# Patient Record
Sex: Male | Born: 1935 | Race: White | Hispanic: No | Smoking: Former smoker
Health system: Southern US, Community
[De-identification: ages and names within clinical notes are randomized; demographics above are authoritative.]

## PROBLEM LIST (undated history)

## (undated) DIAGNOSIS — E039 Hypothyroidism, unspecified: Secondary | ICD-10-CM

## (undated) DIAGNOSIS — Z950 Presence of cardiac pacemaker: Secondary | ICD-10-CM

## (undated) DIAGNOSIS — Z95810 Presence of automatic (implantable) cardiac defibrillator: Secondary | ICD-10-CM

## (undated) DIAGNOSIS — F0390 Unspecified dementia without behavioral disturbance: Secondary | ICD-10-CM

## (undated) DIAGNOSIS — C44300 Unspecified malignant neoplasm of skin of unspecified part of face: Secondary | ICD-10-CM

## (undated) DIAGNOSIS — I442 Atrioventricular block, complete: Secondary | ICD-10-CM

## (undated) HISTORY — PX: SKIN CANCER EXCISION: SHX779

## (undated) HISTORY — PX: CATARACT EXTRACTION: SUR2

## (undated) HISTORY — PX: CATARACT EXTRACTION W/ INTRAOCULAR LENS IMPLANT: SHX1309

## (undated) HISTORY — DX: Atrioventricular block, complete: I44.2

## (undated) HISTORY — PX: APPENDECTOMY: SHX54

---

## 2011-08-12 DIAGNOSIS — R55 Syncope and collapse: Secondary | ICD-10-CM | POA: Insufficient documentation

## 2016-03-01 ENCOUNTER — Inpatient Hospital Stay (HOSPITAL_COMMUNITY): Payer: Medicare Other | Admitting: Certified Registered Nurse Anesthetist

## 2016-03-01 ENCOUNTER — Encounter (INDEPENDENT_AMBULATORY_CARE_PROVIDER_SITE_OTHER): Payer: Medicare Other | Admitting: Ophthalmology

## 2016-03-01 ENCOUNTER — Encounter (HOSPITAL_COMMUNITY): Payer: Self-pay | Admitting: *Deleted

## 2016-03-01 ENCOUNTER — Encounter (HOSPITAL_COMMUNITY)
Admission: RE | Disposition: A | Discharge status: Home / Self Care | Payer: Self-pay | Source: Ambulatory Visit | Attending: Ophthalmology

## 2016-03-01 ENCOUNTER — Observation Stay (HOSPITAL_COMMUNITY)
Admission: RE | Admit: 2016-03-01 | Discharge: 2016-03-02 | Disposition: A | Discharge status: Home / Self Care | Payer: Medicare Other | Source: Ambulatory Visit | Attending: Ophthalmology | Admitting: Ophthalmology

## 2016-03-01 DIAGNOSIS — H44022 Vitreous abscess (chronic), left eye: Secondary | ICD-10-CM | POA: Diagnosis not present

## 2016-03-01 DIAGNOSIS — H353112 Nonexudative age-related macular degeneration, right eye, intermediate dry stage: Secondary | ICD-10-CM | POA: Diagnosis not present

## 2016-03-01 DIAGNOSIS — Z79899 Other long term (current) drug therapy: Secondary | ICD-10-CM | POA: Diagnosis not present

## 2016-03-01 DIAGNOSIS — H44002 Unspecified purulent endophthalmitis, left eye: Principal | ICD-10-CM | POA: Diagnosis present

## 2016-03-01 DIAGNOSIS — Z87891 Personal history of nicotine dependence: Secondary | ICD-10-CM | POA: Insufficient documentation

## 2016-03-01 DIAGNOSIS — H44009 Unspecified purulent endophthalmitis, unspecified eye: Secondary | ICD-10-CM | POA: Diagnosis present

## 2016-03-01 DIAGNOSIS — H43813 Vitreous degeneration, bilateral: Secondary | ICD-10-CM | POA: Diagnosis not present

## 2016-03-01 HISTORY — PX: PARS PLANA VITRECTOMY: SHX2166

## 2016-03-01 HISTORY — DX: Unspecified dementia, unspecified severity, without behavioral disturbance, psychotic disturbance, mood disturbance, and anxiety: F03.90

## 2016-03-01 LAB — GLUCOSE, CAPILLARY
Glucose-Capillary: 173 mg/dL — ABNORMAL HIGH (ref 65–99)
Glucose-Capillary: 188 mg/dL — ABNORMAL HIGH (ref 65–99)

## 2016-03-01 SURGERY — PARS PLANA VITRECTOMY 25 GAUGE FOR ENDOPHTHALMITIS
Anesthesia: General | Site: Eye

## 2016-03-01 MED ORDER — FENTANYL CITRATE (PF) 100 MCG/2ML IJ SOLN
INTRAMUSCULAR | Status: DC | PRN
  Administered 2016-03-01 (×2): 50 ug via INTRAVENOUS

## 2016-03-01 MED ORDER — ONDANSETRON HCL 4 MG/2ML IJ SOLN
INTRAMUSCULAR | Status: AC
  Filled 2016-03-01: qty 4

## 2016-03-01 MED ORDER — PROPOFOL 10 MG/ML IV BOLUS
INTRAVENOUS | Status: AC
  Filled 2016-03-01: qty 20

## 2016-03-01 MED ORDER — TROPICAMIDE 1 % OP SOLN
1.0000 [drp] | OPHTHALMIC | Status: DC | PRN
  Filled 2016-03-01: qty 3

## 2016-03-01 MED ORDER — STERILE WATER FOR INJECTION IJ SOLN
INTRAMUSCULAR | Status: AC
Start: 2016-03-01 — End: 2016-03-01
  Filled 2016-03-01: qty 20

## 2016-03-01 MED ORDER — BACITRACIN-POLYMYXIN B 500-10000 UNIT/GM OP OINT
TOPICAL_OINTMENT | OPHTHALMIC | Status: DC | PRN
  Administered 2016-03-01: 1 via OPHTHALMIC

## 2016-03-01 MED ORDER — SUFENTANIL CITRATE 50 MCG/ML IV SOLN
INTRAVENOUS | Status: AC
  Filled 2016-03-01: qty 1

## 2016-03-01 MED ORDER — OFLOXACIN 0.3 % OP SOLN
1.0000 [drp] | Freq: Four times a day (QID) | OPHTHALMIC | Status: DC
  Filled 2016-03-01: qty 5

## 2016-03-01 MED ORDER — DEXAMETHASONE SODIUM PHOSPHATE 10 MG/ML IJ SOLN
INTRAMUSCULAR | Status: AC
  Filled 2016-03-01: qty 1

## 2016-03-01 MED ORDER — CYCLOPENTOLATE HCL 1 % OP SOLN
1.0000 [drp] | OPHTHALMIC | Status: DC | PRN
  Filled 2016-03-01: qty 2

## 2016-03-01 MED ORDER — MORPHINE SULFATE (PF) 2 MG/ML IV SOLN: 1.0000 mg | INTRAVENOUS | Status: DC | PRN

## 2016-03-01 MED ORDER — BACITRACIN-POLYMYXIN B 500-10000 UNIT/GM OP OINT
TOPICAL_OINTMENT | OPHTHALMIC | Status: AC
  Filled 2016-03-01: qty 3.5

## 2016-03-01 MED ORDER — TETRACAINE HCL 0.5 % OP SOLN
2.0000 [drp] | Freq: Once | OPHTHALMIC | Status: DC
  Filled 2016-03-01: qty 2

## 2016-03-01 MED ORDER — MAGNESIUM HYDROXIDE 400 MG/5ML PO SUSP: 15.0000 mL | Freq: Four times a day (QID) | ORAL | Status: DC | PRN

## 2016-03-01 MED ORDER — GATIFLOXACIN 0.5 % OP SOLN
1.0000 [drp] | OPHTHALMIC | Status: DC | PRN
  Filled 2016-03-01: qty 2.5

## 2016-03-01 MED ORDER — KETOROLAC TROMETHAMINE 0.5 % OP SOLN
1.0000 [drp] | Freq: Four times a day (QID) | OPHTHALMIC | Status: DC
  Filled 2016-03-01: qty 3

## 2016-03-01 MED ORDER — LIDOCAINE HCL 2 % IJ SOLN
INTRAMUSCULAR | Status: AC
  Filled 2016-03-01: qty 20

## 2016-03-01 MED ORDER — STERILE WATER FOR INJECTION IJ SOLN
INTRAMUSCULAR | Status: DC | PRN
  Administered 2016-03-01: .5 mL

## 2016-03-01 MED ORDER — SUFENTANIL CITRATE 50 MCG/ML IV SOLN
INTRAVENOUS | Status: DC | PRN
  Administered 2016-03-01: 10 ug via INTRAVENOUS

## 2016-03-01 MED ORDER — OXYCODONE HCL 5 MG PO TABS
5.0000 mg | ORAL_TABLET | Freq: Once | ORAL | Status: DC | PRN
Start: 2016-03-01 — End: 2016-03-01

## 2016-03-01 MED ORDER — CEFTAZIDIME INTRAVITREAL INJECTION 2.25 MG/0.1 ML
22.5000 mg | INTRAVITREAL | Status: AC
  Administered 2016-03-01: .1 mg via INTRAVITREAL
  Filled 2016-03-01: qty 1

## 2016-03-01 MED ORDER — BSS PLUS IO SOLN
INTRAOCULAR | Status: AC
Start: 2016-03-01 — End: 2016-03-01
  Filled 2016-03-01: qty 500

## 2016-03-01 MED ORDER — DORZOLAMIDE HCL 2 % OP SOLN
1.0000 [drp] | Freq: Three times a day (TID) | OPHTHALMIC | Status: DC
  Filled 2016-03-01: qty 10

## 2016-03-01 MED ORDER — VANCOMYCIN INTRAVITREAL INJECTION 1 MG/0.1 ML
1.0000 mg | INTRAOCULAR | Status: AC
  Administered 2016-03-01: .1 mg via INTRAVITREAL
  Filled 2016-03-01: qty 0.1

## 2016-03-01 MED ORDER — PROPOFOL 10 MG/ML IV BOLUS
INTRAVENOUS | Status: DC | PRN
  Administered 2016-03-01: 140 mg via INTRAVENOUS

## 2016-03-01 MED ORDER — ACETAZOLAMIDE SODIUM 500 MG IJ SOLR
INTRAMUSCULAR | Status: AC
  Filled 2016-03-01: qty 500

## 2016-03-01 MED ORDER — BUPIVACAINE HCL (PF) 0.75 % IJ SOLN
INTRAMUSCULAR | Status: DC | PRN
  Administered 2016-03-01: 1 mL

## 2016-03-01 MED ORDER — BSS IO SOLN
INTRAOCULAR | Status: AC
  Filled 2016-03-01: qty 15

## 2016-03-01 MED ORDER — PREDNISOLONE ACETATE 1 % OP SUSP
1.0000 [drp] | Freq: Four times a day (QID) | OPHTHALMIC | Status: DC
  Filled 2016-03-01: qty 5

## 2016-03-01 MED ORDER — DORZOLAMIDE HCL-TIMOLOL MAL 2-0.5 % OP SOLN
1.0000 [drp] | Freq: Three times a day (TID) | OPHTHALMIC | Status: DC
  Filled 2016-03-01: qty 10

## 2016-03-01 MED ORDER — TRIAMCINOLONE ACETONIDE 40 MG/ML IJ SUSP
INTRAMUSCULAR | Status: AC
  Filled 2016-03-01: qty 5

## 2016-03-01 MED ORDER — OXYCODONE HCL 5 MG/5ML PO SOLN: 5.0000 mg | Freq: Once | ORAL | Status: DC | PRN

## 2016-03-01 MED ORDER — FENTANYL CITRATE (PF) 100 MCG/2ML IJ SOLN
INTRAMUSCULAR | Status: AC
  Filled 2016-03-01: qty 2

## 2016-03-01 MED ORDER — SODIUM HYALURONATE 10 MG/ML IO SOLN
INTRAOCULAR | Status: AC
  Filled 2016-03-01: qty 0.85

## 2016-03-01 MED ORDER — HYPROMELLOSE (GONIOSCOPIC) 2.5 % OP SOLN
OPHTHALMIC | Status: AC
  Filled 2016-03-01: qty 15

## 2016-03-01 MED ORDER — ACETAZOLAMIDE SODIUM 500 MG IJ SOLR
500.0000 mg | Freq: Once | INTRAMUSCULAR | Status: AC
  Administered 2016-03-02: 500 mg via INTRAVENOUS
  Filled 2016-03-01: qty 500

## 2016-03-01 MED ORDER — VANCOMYCIN HCL IN DEXTROSE 1-5 GM/200ML-% IV SOLN
1000.0000 mg | INTRAVENOUS | Status: AC
  Administered 2016-03-01: 1000 mg via INTRAVENOUS
  Filled 2016-03-01: qty 200

## 2016-03-01 MED ORDER — TEMAZEPAM 15 MG PO CAPS
15.0000 mg | ORAL_CAPSULE | Freq: Every evening | ORAL | Status: DC | PRN
Start: 2016-03-01 — End: 2016-03-02
  Filled 2016-03-01: qty 1

## 2016-03-01 MED ORDER — EPINEPHRINE HCL 1 MG/ML IJ SOLN
INTRAMUSCULAR | Status: AC
  Filled 2016-03-01: qty 1

## 2016-03-01 MED ORDER — ACETAMINOPHEN 325 MG PO TABS: 325.0000 mg | ORAL_TABLET | ORAL | Status: DC | PRN

## 2016-03-01 MED ORDER — GLYCOPYRROLATE 0.2 MG/ML IJ SOLN
INTRAMUSCULAR | Status: DC | PRN
  Administered 2016-03-01: 0.4 mg via INTRAVENOUS
  Administered 2016-03-01: 0.2 mg via INTRAVENOUS

## 2016-03-01 MED ORDER — LEVOTHYROXINE SODIUM 75 MCG PO TABS
75.0000 ug | ORAL_TABLET | ORAL | Status: DC
  Administered 2016-03-02: 75 ug via ORAL
  Filled 2016-03-01: qty 1

## 2016-03-01 MED ORDER — LATANOPROST 0.005 % OP SOLN
1.0000 [drp] | Freq: Every day | OPHTHALMIC | Status: DC
  Filled 2016-03-01: qty 2.5

## 2016-03-01 MED ORDER — DEXAMETHASONE SODIUM PHOS INTRAVITREAL INJECTION 0.4 MG/0.1 ML
4.0000 mg | INTRAVITREAL | Status: AC
  Administered 2016-03-01: .1 mg via INTRAVITREAL
  Filled 2016-03-01: qty 1

## 2016-03-01 MED ORDER — CEFTAZIDIME 1 G IJ SOLR
INTRAMUSCULAR | Status: AC
  Filled 2016-03-01: qty 1

## 2016-03-01 MED ORDER — ATROPINE SULFATE 1 % OP SOLN
OPHTHALMIC | Status: AC
  Filled 2016-03-01: qty 5

## 2016-03-01 MED ORDER — LIDOCAINE HCL (CARDIAC) 20 MG/ML IV SOLN
INTRAVENOUS | Status: DC | PRN
  Administered 2016-03-01: 50 mg via INTRAVENOUS

## 2016-03-01 MED ORDER — SODIUM CHLORIDE 0.9 % IJ SOLN
INTRAMUSCULAR | Status: AC
  Filled 2016-03-01: qty 10

## 2016-03-01 MED ORDER — SODIUM CHLORIDE 0.45 % IV SOLN
INTRAVENOUS | Status: DC
  Administered 2016-03-01: 19:00:00 via INTRAVENOUS

## 2016-03-01 MED ORDER — PHENYLEPHRINE HCL 2.5 % OP SOLN
1.0000 [drp] | OPHTHALMIC | Status: DC | PRN
  Filled 2016-03-01: qty 2

## 2016-03-01 MED ORDER — TIMOLOL MALEATE 0.5 % OP SOLN
1.0000 [drp] | Freq: Three times a day (TID) | OPHTHALMIC | Status: DC
  Filled 2016-03-01: qty 5

## 2016-03-01 MED ORDER — ATROPINE SULFATE 1 % OP SOLN: OPHTHALMIC | Status: DC | PRN

## 2016-03-01 MED ORDER — PREDNISOLONE ACETATE 1 % OP SUSP
1.0000 [drp] | OPHTHALMIC | Status: DC
  Filled 2016-03-01: qty 1

## 2016-03-01 MED ORDER — BSS PLUS IO SOLN
INTRAOCULAR | Status: DC | PRN
  Administered 2016-03-01: 500 mL

## 2016-03-01 MED ORDER — GATIFLOXACIN 0.5 % OP SOLN
1.0000 [drp] | Freq: Four times a day (QID) | OPHTHALMIC | Status: DC
  Filled 2016-03-01: qty 2.5

## 2016-03-01 MED ORDER — HYALURONIDASE HUMAN 150 UNIT/ML IJ SOLN
INTRAMUSCULAR | Status: AC
  Filled 2016-03-01: qty 1

## 2016-03-01 MED ORDER — LACTATED RINGERS IV SOLN
INTRAVENOUS | Status: DC | PRN
  Administered 2016-03-01: 15:00:00 via INTRAVENOUS

## 2016-03-01 MED ORDER — LIDOCAINE 2% (20 MG/ML) 5 ML SYRINGE
INTRAMUSCULAR | Status: AC
  Filled 2016-03-01: qty 5

## 2016-03-01 MED ORDER — ONDANSETRON HCL 4 MG/2ML IJ SOLN
4.0000 mg | Freq: Four times a day (QID) | INTRAMUSCULAR | Status: DC
  Administered 2016-03-01 – 2016-03-02 (×3): 4 mg via INTRAVENOUS
  Filled 2016-03-01 (×3): qty 2

## 2016-03-01 MED ORDER — SODIUM CHLORIDE 0.9 % IV SOLN
INTRAVENOUS | Status: DC
  Administered 2016-03-01: 10 mL/h via INTRAVENOUS

## 2016-03-01 MED ORDER — CEFTAZIDIME 2 G IJ SOLR
2.0000 g | INTRAMUSCULAR | Status: AC
  Administered 2016-03-01: 2 g via INTRAVENOUS
  Filled 2016-03-01: qty 2

## 2016-03-01 MED ORDER — ROCURONIUM BROMIDE 100 MG/10ML IV SOLN
INTRAVENOUS | Status: DC | PRN
  Administered 2016-03-01: 10 mg via INTRAVENOUS
  Administered 2016-03-01: 40 mg via INTRAVENOUS

## 2016-03-01 MED ORDER — ACETAMINOPHEN 160 MG/5ML PO SOLN
325.0000 mg | ORAL | Status: DC | PRN
  Filled 2016-03-01: qty 20.3

## 2016-03-01 MED ORDER — NEOSTIGMINE METHYLSULFATE 10 MG/10ML IV SOLN
INTRAVENOUS | Status: DC | PRN
  Administered 2016-03-01: 3 mg via INTRAVENOUS

## 2016-03-01 MED ORDER — FENTANYL CITRATE (PF) 100 MCG/2ML IJ SOLN: 25.0000 ug | INTRAMUSCULAR | Status: DC | PRN

## 2016-03-01 MED ORDER — EPHEDRINE SULFATE 50 MG/ML IJ SOLN
INTRAMUSCULAR | Status: DC | PRN
  Administered 2016-03-01: 10 mg via INTRAVENOUS

## 2016-03-01 MED ORDER — BRIMONIDINE TARTRATE 0.15 % OP SOLN
1.0000 [drp] | Freq: Two times a day (BID) | OPHTHALMIC | Status: DC
  Filled 2016-03-01: qty 5

## 2016-03-01 MED ORDER — BACITRACIN-POLYMYXIN B 500-10000 UNIT/GM OP OINT
1.0000 "application " | TOPICAL_OINTMENT | Freq: Three times a day (TID) | OPHTHALMIC | Status: DC
  Filled 2016-03-01: qty 3.5

## 2016-03-01 MED ORDER — EPHEDRINE 5 MG/ML INJ
INTRAVENOUS | Status: AC
  Filled 2016-03-01: qty 10

## 2016-03-01 MED ORDER — DEXAMETHASONE SODIUM PHOSPHATE 10 MG/ML IJ SOLN
INTRAMUSCULAR | Status: DC | PRN
  Administered 2016-03-01: 10 mg

## 2016-03-01 MED ORDER — BUPIVACAINE HCL (PF) 0.75 % IJ SOLN
INTRAMUSCULAR | Status: AC
  Filled 2016-03-01: qty 10

## 2016-03-01 MED ORDER — POLYMYXIN B SULFATE 500000 UNITS IJ SOLR
INTRAMUSCULAR | Status: AC
  Filled 2016-03-01: qty 500000

## 2016-03-01 MED ORDER — BRIMONIDINE TARTRATE 0.2 % OP SOLN
1.0000 [drp] | Freq: Two times a day (BID) | OPHTHALMIC | Status: DC
  Filled 2016-03-01: qty 5

## 2016-03-01 MED ORDER — HYDROCODONE-ACETAMINOPHEN 5-325 MG PO TABS: 1.0000 | ORAL_TABLET | ORAL | Status: DC | PRN

## 2016-03-01 MED ORDER — BUPIVACAINE-EPINEPHRINE (PF) 0.25% -1:200000 IJ SOLN
INTRAMUSCULAR | Status: AC
  Filled 2016-03-01: qty 30

## 2016-03-01 MED ORDER — SODIUM HYALURONATE 10 MG/ML IO SOLN
INTRAOCULAR | Status: DC | PRN
  Administered 2016-03-01: 0.85 mL via INTRAOCULAR

## 2016-03-01 SURGICAL SUPPLY — 59 items
APPLICATOR COTTON TIP 6IN STRL (MISCELLANEOUS) ×12 IMPLANT
APPLICATOR DR MATTHEWS STRL (MISCELLANEOUS) IMPLANT
BANDAGE EYE OVAL (MISCELLANEOUS) ×3 IMPLANT
BLADE EYE CATARACT 19 1.4 BEAV (BLADE) IMPLANT
CANNULA VLV SOFT TIP 25GA (OPHTHALMIC) ×3 IMPLANT
CORDS BIPOLAR (ELECTRODE) IMPLANT
COTTONBALL LRG STERILE PKG (GAUZE/BANDAGES/DRESSINGS) IMPLANT
COVER MAYO STAND STRL (DRAPES) ×3 IMPLANT
DRAPE OPHTHALMIC 77X100 STRL (CUSTOM PROCEDURE TRAY) ×3 IMPLANT
FILTER BLUE MILLIPORE (MISCELLANEOUS) IMPLANT
FORCEPS ECKARDT ILM 25G SERR (OPHTHALMIC RELATED) IMPLANT
FORCEPS GRIESHABER ILM 25G A (INSTRUMENTS) IMPLANT
FORCEPS HORIZONTAL 25G DISP (OPHTHALMIC RELATED) IMPLANT
FORCEPS ILM 25G DSP TIP (MISCELLANEOUS) IMPLANT
GAS OPHTHALMIC (MISCELLANEOUS) IMPLANT
GLOVE SS BIOGEL STRL SZ 6.5 (GLOVE) ×1 IMPLANT
GLOVE SS BIOGEL STRL SZ 7 (GLOVE) ×1 IMPLANT
GLOVE SUPERSENSE BIOGEL SZ 6.5 (GLOVE) ×2
GLOVE SUPERSENSE BIOGEL SZ 7 (GLOVE) ×2
GLOVE SURG 8.5 LATEX PF (GLOVE) ×18 IMPLANT
GOWN STRL REUS W/ TWL LRG LVL3 (GOWN DISPOSABLE) ×2 IMPLANT
GOWN STRL REUS W/TWL LRG LVL3 (GOWN DISPOSABLE) ×4
HANDLE PNEUMATIC FOR CONSTEL (OPHTHALMIC) IMPLANT
KIT BASIN OR (CUSTOM PROCEDURE TRAY) ×3 IMPLANT
KIT ROOM TURNOVER OR (KITS) ×3 IMPLANT
MICROPICK 25G (MISCELLANEOUS) ×3
NEEDLE 18GX1X1/2 (RX/OR ONLY) (NEEDLE) ×24 IMPLANT
NEEDLE 25GX 5/8IN NON SAFETY (NEEDLE) ×3 IMPLANT
NEEDLE HYPO 30X.5 LL (NEEDLE) ×24 IMPLANT
NS IRRIG 1000ML POUR BTL (IV SOLUTION) ×3 IMPLANT
PACK VITRECTOMY CUSTOM (CUSTOM PROCEDURE TRAY) ×3 IMPLANT
PAD ARMBOARD 7.5X6 YLW CONV (MISCELLANEOUS) ×3 IMPLANT
PAK PIK VITRECTOMY CVS 25GA (OPHTHALMIC) ×3 IMPLANT
PENCIL BIPOLAR 25GA STR DISP (OPHTHALMIC RELATED) IMPLANT
PIC ILLUMINATED 25G (OPHTHALMIC) ×3
PICK MICROPICK 25G (MISCELLANEOUS) ×1 IMPLANT
PIK ILLUMINATED 25G (OPHTHALMIC) ×1 IMPLANT
PROBE LASER ILLUM FLEX CVD 25G (OPHTHALMIC) IMPLANT
ROLLS DENTAL (MISCELLANEOUS) ×6 IMPLANT
SCRAPER DIAMOND 25GA (OPHTHALMIC RELATED) IMPLANT
SET INJECTOR OIL FLUID CONSTEL (OPHTHALMIC) IMPLANT
SPONGE SURGIFOAM ABS GEL 12-7 (HEMOSTASIS) IMPLANT
STOPCOCK 4 WAY LG BORE MALE ST (IV SETS) ×3 IMPLANT
SUT CHROMIC 7 0 TG140 8 (SUTURE) IMPLANT
SUT ETHILON 10 0 CS140 6 (SUTURE) IMPLANT
SUT ETHILON 9 0 TG140 8 (SUTURE) IMPLANT
SUT SILK 4 0 RB 1 (SUTURE) IMPLANT
SUT VICRYL 7 0 TG140 8 (SUTURE) IMPLANT
SWAB COLLECTION DEVICE MRSA (MISCELLANEOUS) IMPLANT
SYR 20CC LL (SYRINGE) ×3 IMPLANT
SYR 5ML LL (SYRINGE) IMPLANT
SYR BULB 3OZ (MISCELLANEOUS) ×3 IMPLANT
SYR TB 1ML LUER SLIP (SYRINGE) ×21 IMPLANT
SYRINGE 10CC LL (SYRINGE) IMPLANT
TOWEL OR 17X24 6PK STRL BLUE (TOWEL DISPOSABLE) ×9 IMPLANT
TUBE ANAEROBIC SPECIMEN COL (MISCELLANEOUS) IMPLANT
TUBING HIGH PRESS EXTEN 6IN (TUBING) IMPLANT
WATER STERILE IRR 1000ML POUR (IV SOLUTION) ×3 IMPLANT
WIPE INSTRUMENT VISIWIPE 73X73 (MISCELLANEOUS) ×3 IMPLANT

## 2016-03-01 NOTE — Anesthesia Procedure Notes (Signed)
Procedure Name: Intubation Date/Time: 03/01/2016 3:51 PM Performed by: Rejeana Brock L Pre-anesthesia Checklist: Patient identified, Emergency Drugs available, Suction available and Patient being monitored Patient Re-evaluated:Patient Re-evaluated prior to inductionOxygen Delivery Method: Circle System Utilized Preoxygenation: Pre-oxygenation with 100% oxygen Intubation Type: IV induction Ventilation: Mask ventilation without difficulty Laryngoscope Size: Mac and 4 Grade View: Grade I Tube type: Oral Tube size: 7.5 mm Number of attempts: 1 Airway Equipment and Method: Stylet and Oral airway Placement Confirmation: ETT inserted through vocal cords under direct vision,  positive ETCO2 and breath sounds checked- equal and bilateral Secured at: 22 cm Tube secured with: Tape Dental Injury: Teeth and Oropharynx as per pre-operative assessment

## 2016-03-01 NOTE — Anesthesia Preprocedure Evaluation (Addendum)
Anesthesia Evaluation  Patient identified by MRN, date of birth, ID band Patient awake    Reviewed: Allergy & Precautions, NPO status , Patient's Chart, lab work & pertinent test results  History of Anesthesia Complications Negative for: history of anesthetic complications  Airway Mallampati: II  TM Distance: >3 FB Neck ROM: Full    Dental  (+) Teeth Intact   Pulmonary former smoker,    breath sounds clear to auscultation       Cardiovascular negative cardio ROS   Rhythm:Regular     Neuro/Psych negative neurological ROS  negative psych ROS   GI/Hepatic negative GI ROS, Neg liver ROS,   Endo/Other    Renal/GU negative Renal ROS     Musculoskeletal negative musculoskeletal ROS (+)   Abdominal   Peds  Hematology negative hematology ROS (+)   Anesthesia Other Findings   Reproductive/Obstetrics                             Anesthesia Physical Anesthesia Plan  ASA: II  Anesthesia Plan: General   Post-op Pain Management:    Induction: Intravenous  Airway Management Planned: Oral ETT  Additional Equipment: None  Intra-op Plan:   Post-operative Plan: Extubation in OR  Informed Consent: I have reviewed the patients History and Physical, chart, labs and discussed the procedure including the risks, benefits and alternatives for the proposed anesthesia with the patient or authorized representative who has indicated his/her understanding and acceptance.   Dental advisory given  Plan Discussed with: CRNA and Surgeon  Anesthesia Plan Comments:         Anesthesia Quick Evaluation

## 2016-03-01 NOTE — Transfer of Care (Signed)
Immediate Anesthesia Transfer of Care Note  Patient: Tracy Castillo  Procedure(s) Performed: Procedure(s): PARS PLANA VITRECTOMY 25 GAUGE FOR ENDOPHTHALMITIS (N/A)  Patient Location: PACU  Anesthesia Type:General  Level of Consciousness: awake, oriented and patient cooperative  Airway & Oxygen Therapy: Patient Spontanous Breathing and Patient connected to nasal cannula oxygen  Post-op Assessment: Report given to RN, Post -op Vital signs reviewed and stable and Patient moving all extremities  Post vital signs: Reviewed and stable  Last Vitals:  Vitals:   03/01/16 1515 03/01/16 1730  BP: (!) 199/72   Pulse: 69   Resp: 18   Temp: 36.6 C 36.3 C    Last Pain: There were no vitals filed for this visit.    Patients Stated Pain Goal: 5 ([id number])  Complications: No apparent anesthesia complications

## 2016-03-01 NOTE — Brief Op Note (Signed)
Brief Operative note   Preoperative diagnosis:  endopthalmitis left eye Postoperative diagnosis  * No Diagnosis Codes entered *  Procedures: Pars plana vitrectomy, culture of aqueous and vitreous.  Injection of antibiotics and steroids into the vitreous and sub conjunctival space.  Surgeon:  Hayden Pedro, MD...  Assistant:  Deatra Ina SA    Anesthesia: Choice  Specimen: none  Estimated blood loss:  1cc  Complications: none  Patient sent to PACU in good condition  Composed by Hayden Pedro MD  Dictation number: [phone]

## 2016-03-01 NOTE — Progress Notes (Signed)
1850 Received pt from PACU, A&O x3, left eye patch and shield dry and intact. Pt denies pain at this time. Call light w/ in reach.

## 2016-03-01 NOTE — H&P (Signed)
Octavious Heike is an 82 y.o. male.   Chief Complaint:strands in my vision left eye HPI: endophthalmitis left eye    No past medical history on file.  Cataract surgery left eye 02-27-2016  No family history on file. Social History:  has no tobacco, alcohol, and drug history on file.  Allergies: Not on File  Medications Prior to Admission  Medication Sig Dispense Refill  . brimonidine (ALPHAGAN) 0.15 % ophthalmic solution Place 1 drop into the right eye 2 (two) times daily.    . dorzolamide-timolol (COSOPT) 22.3-6.8 MG/ML ophthalmic solution Place 1 drop into the right eye 3 (three) times daily.    Marland Kitchen ketorolac (ACULAR) 0.5 % ophthalmic solution Place 1 drop into the left eye 4 (four) times daily.    Marland Kitchen latanoprost (XALATAN) 0.005 % ophthalmic solution Place 1 drop into the right eye at bedtime.    Marland Kitchen ofloxacin (OCUFLOX) 0.3 % ophthalmic solution Place 1 drop into the left eye 4 (four) times daily.    . prednisoLONE acetate (PRED FORTE) 1 % ophthalmic suspension Place 1 drop into the left eye every 2 (two) hours.      Review of systems otherwise negative  There were no vitals taken for this visit.  Physical exam: Mental status: oriented x3. Eyes: See eye exam associated with this date of surgery in media tab.  Scanned in by scanning center Ears, Nose, Throat: within normal limits Neck: Within Normal limits General: within normal limits Chest: Within normal limits Breast: deferred Heart: Within normal limits Abdomen: Within normal limits GU: deferred Extremities: within normal limits Skin: within normal limits  Assessment/Plan Endophthalmitis left eye  Plan: To [facility] for Pars plana vitrectomy, culture, injection of antibiotis left eye  Hayden Pedro 03/01/2016, 3:08 PM

## 2016-03-02 DIAGNOSIS — H44002 Unspecified purulent endophthalmitis, left eye: Secondary | ICD-10-CM | POA: Diagnosis not present

## 2016-03-02 LAB — GLUCOSE, CAPILLARY: Glucose-Capillary: 150 mg/dL — ABNORMAL HIGH (ref 65–99)

## 2016-03-02 MED ORDER — TIMOLOL MALEATE 0.5 % OP SOLN
1.0000 [drp] | Freq: Three times a day (TID) | OPHTHALMIC | Status: DC
  Filled 2016-03-02: qty 5

## 2016-03-02 MED ORDER — GATIFLOXACIN 0.5 % OP SOLN: 1.0000 [drp] | Freq: Four times a day (QID) | OPHTHALMIC | Status: DC

## 2016-03-02 MED ORDER — ACETAZOLAMIDE ER 500 MG PO CP12: 500.0000 mg | ORAL_CAPSULE | Freq: Two times a day (BID) | ORAL | 2 refills | Status: DC

## 2016-03-02 MED ORDER — PREDNISOLONE ACETATE 1 % OP SUSP: 1.0000 [drp] | OPHTHALMIC | 0 refills | Status: DC

## 2016-03-02 MED ORDER — BRIMONIDINE TARTRATE 0.2 % OP SOLN: 1.0000 [drp] | Freq: Two times a day (BID) | OPHTHALMIC | 12 refills | Status: DC

## 2016-03-02 MED ORDER — DORZOLAMIDE HCL 2 % OP SOLN: 1.0000 [drp] | Freq: Three times a day (TID) | OPHTHALMIC | 12 refills | Status: DC

## 2016-03-02 MED ORDER — BACITRACIN-POLYMYXIN B 500-10000 UNIT/GM OP OINT: 1.0000 "application " | TOPICAL_OINTMENT | Freq: Three times a day (TID) | OPHTHALMIC | 0 refills | Status: DC

## 2016-03-02 MED ORDER — KETOROLAC TROMETHAMINE 0.5 % OP SOLN: 1.0000 [drp] | Freq: Four times a day (QID) | OPHTHALMIC | 0 refills | Status: DC

## 2016-03-02 MED ORDER — TIMOLOL MALEATE 0.5 % OP SOLN: 1.0000 [drp] | Freq: Two times a day (BID) | OPHTHALMIC | 12 refills | Status: DC

## 2016-03-02 NOTE — Op Note (Signed)
[name], Tracy Castillo              ACCOUNT NO.:  [id number]  MEDICAL RECORD NO.:  [id number]  LOCATION:  [id number]                        FACILITY:  [place]  PHYSICIAN:  Chrystie Nose. Zigmund Daniel, M.D. DATE OF BIRTH:  Nov 08, 1924  DATE OF PROCEDURE:  03/01/2016 DATE OF DISCHARGE:  03/02/2016                              OPERATIVE REPORT   ADMISSION DIAGNOSIS:  Endophthalmitis, left eye.  PROCEDURES:  Pars plana vitrectomy, left eye.  Culture of eyelid, left eye.  Culture of aqueous, left eye.  Culture of vitreous, left eye. Injection of antibiotics into the vitreous and injection of steroids into the vitreous, left eye.  Injection of antibiotics into the subconjunctival space, left eye.  Injection of Decadron in the subconjunctival space, left eye.  SURGEON:  Chrystie Nose. Zigmund Daniel, M.D.  ASSISTANT:  Deatra Ina, SA.  ANESTHESIA:  General.  DESCRIPTION OF PROCEDURE:  A culture was taken from the patient's eyelid prior to induction of anesthesia.  The contents were placed on culture plates, culture media and slides for Gram stain.  General anesthesia was induced.  Usual prep and drape were performed.  25-gauge trocars were placed at 2 and 4 with infusion at 4.  Vitrectomy was performed initially with withdrawal of vitreous contents into a syringe.  The vitreous contents were then cultured on to culture plates and media and slides.  A 30-gauge needle was then attached to 1 mL syringe and it was introduced into the cornea and into the anterior chamber and a sample was taken.  This was placed on culture plates, culture media, and slides for identification of the organism.  The 25-gauge trocar was placed at 10 o'clock for the light pipe.  Pars plana vitrectomy was begun just behind the pseudophakos.  A peripheral iridectomy was performed at 7 o'clock.  The vitrectomy was carried posteriorly and large clumps of white material were engaged, carefully removed under low suction and rapid cutting.  A core  vitrectomy was performed.  There was no attempt to extend the vitrectomy into the far periphery and there was very little traction used to obtain or to remove this central vitreous.  Once the vitrectomy was completed, the vitrector was removed.  One trocar was removed.  The wound was tested and found to be secure.  Ceftazidime 2.25 mg in 0.1 mL was injected into the central vitreous.  Vancomycin 1 mg in 0.1 mL was injected into the central vitreous.  Decadron 400 mcg in 0.1 mL was injected into the central vitreous.  The trocars were removed from the eye.  The wounds were tested and found to be secure. Ceftazidime 50 mg in 1 mL was injected into the superior subconjunctival space.  Vancomycin 10 mg was injected into the inferior subconjunctival space.  Decadron 10 mg was injected into the nasal subconjunctival space.  The wounds were tested and found to be secure.  The closing pressure was 10 with a Barraquer tonometer.  Polymyxin and ceftazidime were injected around the globe for antibiotic coverage. Marcaine solution was injected around the globe for postoperative pain. Polysporin ointment, a patch and shield were placed.  The patient was awakened and taken to recovery in satisfactory condition.     Jenny Reichmann  Eber Jones, M.D.     JDM/MEDQ  D:  03/01/2016  T:  03/02/2016  Job:  [id number]

## 2016-03-02 NOTE — Progress Notes (Signed)
03/02/2016, 8:51 AM  Mental Status:  Awake, Alert, Oriented  Anterior segment: Cornea  1+ corneal edema     Anterior Chamber Clear    Lens:   IOL  Intra Ocular Pressure 30 mmHg with Tonopen  Vitreous: Clear   Retina:  Attached Good laser reaction   Impression: Excellent result Retina attached   Final Diagnosis: Principal Problem:   Endophthalmitis, left eye Active Problems:   Endophthalmitis purulent   Plan: start post operative eye drops. Add glaucoma drops  Discharge to home.  Give post operative instructions  Hayden Pedro 03/02/2016, 8:51 AM

## 2016-03-03 ENCOUNTER — Encounter (HOSPITAL_COMMUNITY): Payer: Self-pay | Admitting: Ophthalmology

## 2016-03-04 ENCOUNTER — Inpatient Hospital Stay (INDEPENDENT_AMBULATORY_CARE_PROVIDER_SITE_OTHER): Payer: Medicare Other | Admitting: Ophthalmology

## 2016-03-04 DIAGNOSIS — H44001 Unspecified purulent endophthalmitis, right eye: Secondary | ICD-10-CM

## 2016-03-04 LAB — EYE CULTURE

## 2016-03-05 LAB — EYE CULTURE
CULTURE: NO GROWTH
CULTURE: NO GROWTH
Culture: NO GROWTH
Culture: NO GROWTH

## 2016-03-09 NOTE — Anesthesia Postprocedure Evaluation (Signed)
Anesthesia Post Note  Patient: Tracy Castillo  Procedure(s) Performed: Procedure(s) (LRB): PARS PLANA VITRECTOMY 25 GAUGE FOR ENDOPHTHALMITIS (N/A)  Patient location during evaluation: PACU Anesthesia Type: General Level of consciousness: awake Pain management: pain level controlled Vital Signs Assessment: post-procedure vital signs reviewed and stable Respiratory status: spontaneous breathing Cardiovascular status: stable Postop Assessment: no signs of nausea or vomiting Anesthetic complications: no    Last Vitals:  Vitals:   03/01/16 2043 03/02/16 0500  BP: (!) 158/57 (!) 147/66  Pulse: 77 77  Resp: 19 18  Temp: 36.7 C 36.7 C    Last Pain:  Vitals:   03/02/16 0500  TempSrc: Oral                 Venesha Petraitis

## 2016-03-12 NOTE — Discharge Summary (Signed)
Discharge summary not needed on [facility] patients per medical records. 

## 2016-03-22 LAB — CULTURE, FUNGUS WITHOUT SMEAR

## 2016-09-03 ENCOUNTER — Encounter (HOSPITAL_COMMUNITY): Payer: Self-pay | Admitting: General Practice

## 2016-09-03 ENCOUNTER — Inpatient Hospital Stay (HOSPITAL_COMMUNITY)
Admission: AD | Admit: 2016-09-03 | Discharge: 2016-09-05 | DRG: 244 | Disposition: A | Discharge status: Home / Self Care | Payer: Medicare Other | Source: Other Acute Inpatient Hospital | Attending: Internal Medicine | Admitting: Internal Medicine

## 2016-09-03 DIAGNOSIS — I872 Venous insufficiency (chronic) (peripheral): Secondary | ICD-10-CM | POA: Diagnosis not present

## 2016-09-03 DIAGNOSIS — H409 Unspecified glaucoma: Secondary | ICD-10-CM | POA: Diagnosis not present

## 2016-09-03 DIAGNOSIS — R001 Bradycardia, unspecified: Secondary | ICD-10-CM | POA: Diagnosis present

## 2016-09-03 DIAGNOSIS — E039 Hypothyroidism, unspecified: Secondary | ICD-10-CM | POA: Diagnosis not present

## 2016-09-03 DIAGNOSIS — Z9842 Cataract extraction status, left eye: Secondary | ICD-10-CM

## 2016-09-03 DIAGNOSIS — Z950 Presence of cardiac pacemaker: Secondary | ICD-10-CM

## 2016-09-03 DIAGNOSIS — Z85828 Personal history of other malignant neoplasm of skin: Secondary | ICD-10-CM | POA: Diagnosis not present

## 2016-09-03 DIAGNOSIS — Z961 Presence of intraocular lens: Secondary | ICD-10-CM | POA: Diagnosis not present

## 2016-09-03 DIAGNOSIS — I442 Atrioventricular block, complete: Principal | ICD-10-CM | POA: Diagnosis present

## 2016-09-03 DIAGNOSIS — F0390 Unspecified dementia without behavioral disturbance: Secondary | ICD-10-CM | POA: Diagnosis not present

## 2016-09-03 DIAGNOSIS — I4510 Unspecified right bundle-branch block: Secondary | ICD-10-CM | POA: Diagnosis present

## 2016-09-03 DIAGNOSIS — R030 Elevated blood-pressure reading, without diagnosis of hypertension: Secondary | ICD-10-CM | POA: Diagnosis present

## 2016-09-03 DIAGNOSIS — Z9841 Cataract extraction status, right eye: Secondary | ICD-10-CM

## 2016-09-03 DIAGNOSIS — Z79899 Other long term (current) drug therapy: Secondary | ICD-10-CM

## 2016-09-03 DIAGNOSIS — W1830XA Fall on same level, unspecified, initial encounter: Secondary | ICD-10-CM | POA: Diagnosis not present

## 2016-09-03 DIAGNOSIS — Z87891 Personal history of nicotine dependence: Secondary | ICD-10-CM

## 2016-09-03 HISTORY — DX: Unspecified malignant neoplasm of skin of unspecified part of face: C44.300

## 2016-09-03 HISTORY — DX: Hypothyroidism, unspecified: E03.9

## 2016-09-03 LAB — CBC
HEMATOCRIT: 41.1 % (ref 39.0–52.0)
HEMOGLOBIN: 13.8 g/dL (ref 13.0–17.0)
MCH: 30.5 pg (ref 26.0–34.0)
MCHC: 33.6 g/dL (ref 30.0–36.0)
MCV: 90.9 fL (ref 78.0–100.0)
PLATELETS: 177 10*3/uL (ref 150–400)
RBC: 4.52 MIL/uL (ref 4.22–5.81)
RDW: 13.8 % (ref 11.5–15.5)
WBC: 7.8 10*3/uL (ref 4.0–10.5)

## 2016-09-03 LAB — CREATININE, SERUM
Creatinine, Ser: 1.39 mg/dL — ABNORMAL HIGH (ref 0.61–1.24)
GFR calc non Af Amer: 43 mL/min — ABNORMAL LOW (ref >60)
GFR, EST AFRICAN AMERICAN: 50 mL/min — AB (ref >60)

## 2016-09-03 MED ORDER — NITROGLYCERIN 0.4 MG SL SUBL: 0.4000 mg | SUBLINGUAL_TABLET | SUBLINGUAL | Status: DC | PRN

## 2016-09-03 MED ORDER — DORZOLAMIDE HCL 2 % OP SOLN
1.0000 [drp] | Freq: Three times a day (TID) | OPHTHALMIC | Status: DC
Start: 2016-09-03 — End: 2016-09-04

## 2016-09-03 MED ORDER — HEPARIN SODIUM (PORCINE) 5000 UNIT/ML IJ SOLN
5000.0000 [IU] | Freq: Three times a day (TID) | INTRAMUSCULAR | Status: DC
  Administered 2016-09-03 – 2016-09-04 (×2): 5000 [IU] via SUBCUTANEOUS
  Filled 2016-09-03 (×2): qty 1

## 2016-09-03 MED ORDER — KETOROLAC TROMETHAMINE 0.5 % OP SOLN: 1.0000 [drp] | Freq: Four times a day (QID) | OPHTHALMIC | Status: DC

## 2016-09-03 MED ORDER — CO-ENZYME Q-10 30 MG PO CAPS: 30.0000 mg | ORAL_CAPSULE | Freq: Every day | ORAL | Status: DC

## 2016-09-03 MED ORDER — BRIMONIDINE TARTRATE 0.2 % OP SOLN
1.0000 [drp] | Freq: Two times a day (BID) | OPHTHALMIC | Status: DC
Start: 2016-09-03 — End: 2016-09-03

## 2016-09-03 MED ORDER — PREDNISOLONE ACETATE 1 % OP SUSP: 1.0000 [drp] | OPHTHALMIC | Status: DC

## 2016-09-03 MED ORDER — CYANOCOBALAMIN ER 1000 MCG PO TBCR: 1000.0000 ug | EXTENDED_RELEASE_TABLET | Freq: Every day | ORAL | Status: DC

## 2016-09-03 MED ORDER — TIMOLOL MALEATE 0.5 % OP SOLN: 1.0000 [drp] | Freq: Two times a day (BID) | OPHTHALMIC | Status: DC

## 2016-09-03 MED ORDER — LATANOPROST 0.005 % OP SOLN
1.0000 [drp] | Freq: Every day | OPHTHALMIC | Status: DC
  Administered 2016-09-04: 1 [drp] via OPHTHALMIC
  Filled 2016-09-03: qty 2.5

## 2016-09-03 MED ORDER — OFLOXACIN 0.3 % OP SOLN: 1.0000 [drp] | Freq: Four times a day (QID) | OPHTHALMIC | Status: DC

## 2016-09-03 MED ORDER — ASPIRIN EC 81 MG PO TBEC
81.0000 mg | DELAYED_RELEASE_TABLET | Freq: Every day | ORAL | Status: DC
  Administered 2016-09-03 – 2016-09-05 (×3): 81 mg via ORAL
  Filled 2016-09-03 (×3): qty 1

## 2016-09-03 MED ORDER — ACETAZOLAMIDE ER 500 MG PO CP12
500.0000 mg | ORAL_CAPSULE | Freq: Two times a day (BID) | ORAL | Status: DC
Start: 2016-09-03 — End: 2016-09-03
  Filled 2016-09-03: qty 1

## 2016-09-03 MED ORDER — LEVOTHYROXINE SODIUM 75 MCG PO TABS
75.0000 ug | ORAL_TABLET | Freq: Every day | ORAL | Status: DC
  Administered 2016-09-04 – 2016-09-05 (×2): 75 ug via ORAL
  Filled 2016-09-03 (×2): qty 1

## 2016-09-03 MED ORDER — BACITRACIN-POLYMYXIN B 500-10000 UNIT/GM OP OINT: 1.0000 "application " | TOPICAL_OINTMENT | Freq: Three times a day (TID) | OPHTHALMIC | Status: DC

## 2016-09-03 MED ORDER — BRIMONIDINE TARTRATE 0.15 % OP SOLN: 1.0000 [drp] | Freq: Two times a day (BID) | OPHTHALMIC | Status: DC

## 2016-09-03 MED ORDER — ONDANSETRON HCL 4 MG/2ML IJ SOLN: 4.0000 mg | Freq: Four times a day (QID) | INTRAMUSCULAR | Status: DC | PRN

## 2016-09-03 MED ORDER — DORZOLAMIDE HCL-TIMOLOL MAL 2-0.5 % OP SOLN: 1.0000 [drp] | Freq: Three times a day (TID) | OPHTHALMIC | Status: DC

## 2016-09-03 MED ORDER — GATIFLOXACIN 0.5 % OP SOLN: 1.0000 [drp] | Freq: Four times a day (QID) | OPHTHALMIC | Status: DC

## 2016-09-03 MED ORDER — ACETAMINOPHEN 325 MG PO TABS: 650.0000 mg | ORAL_TABLET | ORAL | Status: DC | PRN

## 2016-09-03 NOTE — H&P (Addendum)
Patient ID: Tracy Castillo MRN: [id number], DOB/AGE: September 13, 1924   Admit date: 09/03/2016   Primary Physician: Waldon Reining, NP Primary Cardiologist: New to [facility]  Pt. Profile: Tracy Castillo is a 83 y.o. male with a history of dementia and hypothyroidism who transferred  to [facility] from [facility] for symptomatic bradycardia.  HPI: As above. No prior cardiac history. Never had an echocardiogram or cath. He is not on any AV blocking agent. He was in usual state of health up until this morning when he was walking to his mailbox and suddenly his legs gave out and fall. No prodrome. No syncope. He went to [facility] emergency room as noted to be bradycardic in his 14s and transferred to [facility]. Patient had a similar episode of leg giving out in Fall 2017 and  2016. He did not seek any medical attention at that time.  EKG at more 3rd degree AV block at rate of 39 bpm -  Personally reviewed. Telemetry shows bradycardia at rate of 30s to 40s intermittently dropping to 30's at that time he in complete heart block - Personally reviewed.  Potassium 4.6. BUN 31. Creatinine 1.43. TSH 5.64. Liver function test within normal limits. Hemoglobin 13.8.   Patient denies any family history of cardiac disease, hypertension or diabetes. No family history of stroke.  Problem List  Past Medical History:  Diagnosis Date  . Dementia    "maybe a little; never told us exactly" (09/03/2016)  . Hypothyroidism   . Skin cancer of face     Past Surgical History:  Procedure Laterality Date  . APPENDECTOMY    . CATARACT EXTRACTION Left    "tried to put lens in but couldn't"  . CATARACT EXTRACTION W/ INTRAOCULAR LENS IMPLANT Right   . EYE SURGERY    . PARS PLANA VITRECTOMY N/A 03/01/2016   Procedure: PARS PLANA VITRECTOMY 25 GAUGE FOR ENDOPHTHALMITIS;  Surgeon: Hayden Pedro, MD;  Location: [place];  Service: Ophthalmology;  Laterality: N/A;  . SKIN CANCER EXCISION Left    face       Allergies  No Known Allergies   Home Medications  Prior to Admission medications   Medication Sig Start Date End Date Taking? Authorizing Provider  acetaZOLAMIDE (DIAMOX SEQUELS) 500 MG capsule Take 1 capsule (500 mg total) by mouth 2 (two) times daily. 03/02/16   Hayden Pedro, MD  bacitracin-polymyxin b (POLYSPORIN) ophthalmic ointment Place 1 application into the left eye 3 (three) times daily. apply to eye every 12 hours while awake 03/02/16   Hayden Pedro, MD  brimonidine (ALPHAGAN) 0.15 % ophthalmic solution Place 1 drop into the right eye 2 (two) times daily. 02/15/16   Historical Provider, MD  brimonidine (ALPHAGAN) 0.2 % ophthalmic solution Place 1 drop into both eyes 2 (two) times daily. 03/02/16   Hayden Pedro, MD  Co-Enzyme Q-10 30 MG CAPS Take 30 mg by mouth daily.    Historical Provider, MD  Cyanocobalamin (RA VITAMIN B-12 TR) 1000 MCG TBCR Take 1 capsule by mouth daily.    Historical Provider, MD  dorzolamide (TRUSOPT) 2 % ophthalmic solution Place 1 drop into the left eye 3 (three) times daily. 03/02/16   Hayden Pedro, MD  dorzolamide-timolol (COSOPT) 22.3-6.8 MG/ML ophthalmic solution Place 1 drop into the right eye 3 (three) times daily. 02/13/16   Historical Provider, MD  gatifloxacin (ZYMAXID) 0.5 % SOLN Place 1 drop into the left eye 4 (four) times daily. 03/02/16  Hayden Pedro, MD  ketorolac (ACULAR) 0.5 % ophthalmic solution Place 1 drop into the left eye 4 (four) times daily. 03/02/16   Hayden Pedro, MD  latanoprost (XALATAN) 0.005 % ophthalmic solution Place 1 drop into the right eye at bedtime. 02/26/16   Historical Provider, MD  levothyroxine (SYNTHROID, LEVOTHROID) 75 MCG tablet Take 1 tablet by mouth every morning.    Historical Provider, MD  ofloxacin (OCUFLOX) 0.3 % ophthalmic solution Place 1 drop into the left eye 4 (four) times daily. 02/15/16   Historical Provider, MD  Omega-3 Fatty Acids (FISH OIL) 1000 MG CAPS Take 1 capsule by mouth daily.     Historical Provider, MD  prednisoLONE acetate (PRED FORTE) 1 % ophthalmic suspension Place 1 drop into the left eye every 2 (two) hours while awake. 03/02/16   Hayden Pedro, MD  timolol (TIMOPTIC) 0.5 % ophthalmic solution Place 1 drop into both eyes 2 (two) times daily. 03/02/16   Hayden Pedro, MD    Family History  As above.   Social History  Social History   Social History  . Marital status: Divorced    Spouse name: N/A  . Number of children: N/A  . Years of education: N/A   Occupational History  . Not on file.   Social History Main Topics  . Smoking status: Former Smoker    Packs/day: 0.50    Years: 10.00    Types: Cigarettes  . Smokeless tobacco: Never Used     Comment: quit smoking "in 1950"  . Alcohol use Not on file     Comment: "red wine"  . Drug use: No  . Sexual activity: Not on file   All other systems reviewed and are otherwise negative except as noted above.  Physical Exam  Blood pressure (!) 175/51, pulse (!) 43, temperature 97.8 F (36.6 C), temperature source Oral, resp. rate 17, height 5\' 6"  (1.676 m), weight 170 lb 3.1 oz (77.2 kg), SpO2 99 %.  General: Pleasant, NAD Psych: Normal affect. Neuro: Alert and oriented X 3. Moves all extremities spontaneously. HEENT: Normal  Neck: Supple without bruits or JVD. Lungs:  Resp regular and unlabored, CTA. Heart: Regular rhythm with bradycardia,  no s3, s4, or murmurs. Abdomen: Soft, non-tender, non-distended, BS + x 4.  Extremities: No clubbing, cyanosis or edema. DP/PT/Radials 2+ and equal bilaterally.   ASSESSMENT AND PLAN  1. Symptomatic bradycardia/CHB - No prodrome. No syncope. Symptoms is "sudden leg giving out ". Two prior episode as well. He is not on any AV blocking agent. - Will get echocardiogram and possible pacemaker placement by EP tomorrow. Keep nothing by mouth after midnight. Pacer at bedside overnight. Continue to observe closely on telemetry.  2. Elevated blood pressure - No  history of hypertension. Avoid any AV blocking agent.  SignedLeanor Kail, PA-C 09/03/2016, 7:27 PM Pager [phone]   Patient seen and examined, history as above with really nothing to add to it.  He has been healthy and active and normally does exercises without cardiac symptoms.  He has symptomatic third-degree block but with preserved blood pressure.  Agree with examination above except he has changes of chronic venous insufficiency to bilaterally with trace edema.  Pulses are bounding and are 3+ and his neurologic examination was normal.  Cardiac exam shows normal S1 and S2 there is no S3 or murmur.  Pulse is somewhat irregular.  12 EKG personally reviewed by me shows a right bundle branch block and third-degree AV block.  Review  of an EKG from September 29 shows what looks like junctional rhythm at that time with heart block.  1.  Symptomatic third-degree heart block 2.  Hypothyroidism 3.  Chronic venous insufficiency  RECOMMENDATIONS:  The patient has symptomatic third-degree heart block.  He will have the temporary external patches applied tonight the do not see a need for a temporary pacemaking wire tonight since he has a good blood pressure and is asymptomatic.  He should be kept nothing by mouth for permanent pacemaker tomorrow with electrophysiology consultation in the morning.  Kerry Hough. MD [facility] 09/03/2016 7:58 PM

## 2016-09-04 ENCOUNTER — Encounter (HOSPITAL_COMMUNITY)
Admission: AD | Disposition: A | Discharge status: Home / Self Care | Payer: Self-pay | Source: Other Acute Inpatient Hospital | Attending: Internal Medicine

## 2016-09-04 ENCOUNTER — Encounter (HOSPITAL_COMMUNITY): Payer: Self-pay | Admitting: Physician Assistant

## 2016-09-04 ENCOUNTER — Observation Stay (HOSPITAL_BASED_OUTPATIENT_CLINIC_OR_DEPARTMENT_OTHER): Payer: Medicare Other

## 2016-09-04 DIAGNOSIS — Z85828 Personal history of other malignant neoplasm of skin: Secondary | ICD-10-CM | POA: Diagnosis not present

## 2016-09-04 DIAGNOSIS — I442 Atrioventricular block, complete: Secondary | ICD-10-CM

## 2016-09-04 DIAGNOSIS — I872 Venous insufficiency (chronic) (peripheral): Secondary | ICD-10-CM | POA: Diagnosis present

## 2016-09-04 DIAGNOSIS — E039 Hypothyroidism, unspecified: Secondary | ICD-10-CM | POA: Diagnosis present

## 2016-09-04 DIAGNOSIS — Z79899 Other long term (current) drug therapy: Secondary | ICD-10-CM | POA: Diagnosis not present

## 2016-09-04 DIAGNOSIS — Z87891 Personal history of nicotine dependence: Secondary | ICD-10-CM | POA: Diagnosis not present

## 2016-09-04 DIAGNOSIS — W1830XA Fall on same level, unspecified, initial encounter: Secondary | ICD-10-CM | POA: Diagnosis present

## 2016-09-04 DIAGNOSIS — H409 Unspecified glaucoma: Secondary | ICD-10-CM | POA: Diagnosis present

## 2016-09-04 DIAGNOSIS — Z9842 Cataract extraction status, left eye: Secondary | ICD-10-CM | POA: Diagnosis not present

## 2016-09-04 DIAGNOSIS — I4510 Unspecified right bundle-branch block: Secondary | ICD-10-CM | POA: Diagnosis present

## 2016-09-04 DIAGNOSIS — R001 Bradycardia, unspecified: Secondary | ICD-10-CM | POA: Diagnosis present

## 2016-09-04 DIAGNOSIS — Z9841 Cataract extraction status, right eye: Secondary | ICD-10-CM | POA: Diagnosis not present

## 2016-09-04 DIAGNOSIS — F0390 Unspecified dementia without behavioral disturbance: Secondary | ICD-10-CM | POA: Diagnosis present

## 2016-09-04 DIAGNOSIS — R030 Elevated blood-pressure reading, without diagnosis of hypertension: Secondary | ICD-10-CM | POA: Diagnosis present

## 2016-09-04 DIAGNOSIS — Z961 Presence of intraocular lens: Secondary | ICD-10-CM | POA: Diagnosis present

## 2016-09-04 HISTORY — PX: PACEMAKER IMPLANT: EP1218

## 2016-09-04 LAB — ECHOCARDIOGRAM COMPLETE
Height: 66 in
Weight: 2636.8 oz

## 2016-09-04 LAB — BASIC METABOLIC PANEL
ANION GAP: 7 (ref 5–15)
BUN: 23 mg/dL — ABNORMAL HIGH (ref 6–20)
CO2: 24 mmol/L (ref 22–32)
Calcium: 8.6 mg/dL — ABNORMAL LOW (ref 8.9–10.3)
Chloride: 105 mmol/L (ref 101–111)
Creatinine, Ser: 1.36 mg/dL — ABNORMAL HIGH (ref 0.61–1.24)
GFR calc non Af Amer: 44 mL/min — ABNORMAL LOW (ref >60)
GFR, EST AFRICAN AMERICAN: 52 mL/min — AB (ref >60)
GLUCOSE: 102 mg/dL — AB (ref 65–99)
Potassium: 3.9 mmol/L (ref 3.5–5.1)
Sodium: 136 mmol/L (ref 135–145)

## 2016-09-04 LAB — CBC
HCT: 38.6 % — ABNORMAL LOW (ref 39.0–52.0)
HEMOGLOBIN: 12.7 g/dL — AB (ref 13.0–17.0)
MCH: 29.9 pg (ref 26.0–34.0)
MCHC: 32.9 g/dL (ref 30.0–36.0)
MCV: 90.8 fL (ref 78.0–100.0)
Platelets: 174 10*3/uL (ref 150–400)
RBC: 4.25 MIL/uL (ref 4.22–5.81)
RDW: 13.9 % (ref 11.5–15.5)
WBC: 6.5 10*3/uL (ref 4.0–10.5)

## 2016-09-04 LAB — SURGICAL PCR SCREEN
MRSA, PCR: NEGATIVE
Staphylococcus aureus: POSITIVE — AB

## 2016-09-04 LAB — MRSA PCR SCREENING: MRSA by PCR: NEGATIVE

## 2016-09-04 SURGERY — PACEMAKER IMPLANT

## 2016-09-04 MED ORDER — HEPARIN (PORCINE) IN NACL 2-0.9 UNIT/ML-% IJ SOLN
INTRAMUSCULAR | Status: AC
  Filled 2016-09-04: qty 500

## 2016-09-04 MED ORDER — GENTAMICIN SULFATE 40 MG/ML IJ SOLN
INTRAMUSCULAR | Status: AC
  Filled 2016-09-04: qty 2

## 2016-09-04 MED ORDER — LIDOCAINE HCL (PF) 1 % IJ SOLN
INTRAMUSCULAR | Status: AC
  Filled 2016-09-04: qty 60

## 2016-09-04 MED ORDER — SODIUM CHLORIDE 0.9% FLUSH: 3.0000 mL | INTRAVENOUS | Status: DC | PRN

## 2016-09-04 MED ORDER — FENTANYL CITRATE (PF) 100 MCG/2ML IJ SOLN
INTRAMUSCULAR | Status: DC | PRN
  Administered 2016-09-04 (×2): 12.5 ug via INTRAVENOUS

## 2016-09-04 MED ORDER — MUPIROCIN 2 % EX OINT
1.0000 "application " | TOPICAL_OINTMENT | Freq: Two times a day (BID) | CUTANEOUS | Status: DC
  Administered 2016-09-04 – 2016-09-05 (×3): 1 via NASAL
  Filled 2016-09-04: qty 22

## 2016-09-04 MED ORDER — CEFAZOLIN IN D5W 1 GM/50ML IV SOLN
1.0000 g | Freq: Four times a day (QID) | INTRAVENOUS | Status: AC
  Administered 2016-09-04 – 2016-09-05 (×2): 1 g via INTRAVENOUS
  Filled 2016-09-04 (×3): qty 50

## 2016-09-04 MED ORDER — HYDRALAZINE HCL 20 MG/ML IJ SOLN
5.0000 mg | Freq: Four times a day (QID) | INTRAMUSCULAR | Status: DC | PRN
  Administered 2016-09-04 – 2016-09-05 (×2): 5 mg via INTRAVENOUS
  Filled 2016-09-04 (×2): qty 1

## 2016-09-04 MED ORDER — CHLORHEXIDINE GLUCONATE 4 % EX LIQD
60.0000 mL | Freq: Once | CUTANEOUS | Status: AC
  Administered 2016-09-04: 4 via TOPICAL
  Filled 2016-09-04: qty 60

## 2016-09-04 MED ORDER — CEFAZOLIN SODIUM-DEXTROSE 2-4 GM/100ML-% IV SOLN
2.0000 g | INTRAVENOUS | Status: AC
  Administered 2016-09-04: 2 g via INTRAVENOUS

## 2016-09-04 MED ORDER — CHLORHEXIDINE GLUCONATE 4 % EX LIQD: 60.0000 mL | Freq: Once | CUTANEOUS | Status: AC

## 2016-09-04 MED ORDER — ONDANSETRON HCL 4 MG/2ML IJ SOLN: 4.0000 mg | Freq: Four times a day (QID) | INTRAMUSCULAR | Status: DC | PRN

## 2016-09-04 MED ORDER — LIDOCAINE HCL (PF) 1 % IJ SOLN
INTRAMUSCULAR | Status: DC | PRN
  Administered 2016-09-04: 50 mL

## 2016-09-04 MED ORDER — HEPARIN (PORCINE) IN NACL 2-0.9 UNIT/ML-% IJ SOLN
INTRAMUSCULAR | Status: DC | PRN
  Administered 2016-09-04: 15:00:00

## 2016-09-04 MED ORDER — SODIUM CHLORIDE 0.9% FLUSH
3.0000 mL | Freq: Two times a day (BID) | INTRAVENOUS | Status: DC
  Administered 2016-09-04: 3 mL via INTRAVENOUS

## 2016-09-04 MED ORDER — MIDAZOLAM HCL 5 MG/5ML IJ SOLN
INTRAMUSCULAR | Status: DC | PRN
  Administered 2016-09-04 (×2): 1 mg via INTRAVENOUS

## 2016-09-04 MED ORDER — CHLORHEXIDINE GLUCONATE CLOTH 2 % EX PADS
6.0000 | MEDICATED_PAD | Freq: Every day | CUTANEOUS | Status: DC
  Administered 2016-09-04: 6 via TOPICAL

## 2016-09-04 MED ORDER — FENTANYL CITRATE (PF) 100 MCG/2ML IJ SOLN
INTRAMUSCULAR | Status: AC
  Filled 2016-09-04: qty 2

## 2016-09-04 MED ORDER — SODIUM CHLORIDE 0.9 % IV SOLN
INTRAVENOUS | Status: DC
  Administered 2016-09-04: 09:00:00 via INTRAVENOUS

## 2016-09-04 MED ORDER — SODIUM CHLORIDE 0.9 % IV SOLN: 250.0000 mL | INTRAVENOUS | Status: DC

## 2016-09-04 MED ORDER — SODIUM CHLORIDE 0.9 % IR SOLN
80.0000 mg | Status: AC
  Administered 2016-09-04: 80 mg

## 2016-09-04 MED ORDER — ACETAMINOPHEN 325 MG PO TABS
325.0000 mg | ORAL_TABLET | ORAL | Status: DC | PRN
  Administered 2016-09-04 – 2016-09-05 (×2): 650 mg via ORAL
  Filled 2016-09-04 (×2): qty 2

## 2016-09-04 MED ORDER — CEFAZOLIN SODIUM-DEXTROSE 2-4 GM/100ML-% IV SOLN
INTRAVENOUS | Status: AC
  Filled 2016-09-04: qty 100

## 2016-09-04 MED ORDER — MIDAZOLAM HCL 5 MG/5ML IJ SOLN
INTRAMUSCULAR | Status: AC
  Filled 2016-09-04: qty 5

## 2016-09-04 SURGICAL SUPPLY — 12 items
CABLE SURGICAL S-101-97-12 (CABLE) ×3 IMPLANT
CATH RIGHTSITE C315HIS02 (CATHETERS) ×3 IMPLANT
IPG PACE AZUR XT DR MRI W1DR01 (Pacemaker) ×1 IMPLANT
LEAD CAPSURE NOVUS 5076-52CM (Lead) ×3 IMPLANT
LEAD SELECT SECURE 3830 383069 (Lead) ×1 IMPLANT
PACE AZURE XT DR MRI W1DR01 (Pacemaker) ×3 IMPLANT
PAD DEFIB LIFELINK (PAD) ×3 IMPLANT
SELECT SECURE 3830 383069 (Lead) ×3 IMPLANT
SHEATH CLASSIC 7F (SHEATH) ×6 IMPLANT
SLITTER 6232ADJ (MISCELLANEOUS) ×3 IMPLANT
TRAY PACEMAKER INSERTION (PACKS) ×3 IMPLANT
WIRE HI TORQ VERSACORE-J 145CM (WIRE) ×3 IMPLANT

## 2016-09-04 NOTE — H&P (View-Only) (Signed)
ELECTROPHYSIOLOGY CONSULT NOTE    Patient ID: Tracy Castillo MRN: [id number], DOB/AGE: 1925/01/17 83 y.o.  Admit date: 09/03/2016 Date of Consult: 09/04/2016   Primary Physician: Waldon Reining, NP Primary Cardiologist: New, Dr. Wynonia Lawman  Reason for Consultation: CHB  HPI: Tracy Castillo is a 83 y.o. male who is being seen today for the evaluation of CHB at the request of Dr. Wynonia Lawman. PMHx is noted only for hypothyroid and glaucoma, initially seen at [facility] after a fall, noted to be in CHB and suspect symptomatic bradycardia, transferred to [facility] last evening for further management.  The patient lives independently, was walking to the mailbox and when he got there his lgs lost strength and fell to his knees.  He did not have any kind of CP or SOB, he has not noted any change in his usual exertional capacity.  He did not faint.  He denies any syncope or near syncope history.  About 2 months ago he recalls a similar event that his legs seemed to give out on him.  He denies palpitations, no cardiac history or prior cardiac evaluations.  At rest this morning he is feeling well and without complaints.  [place] records reviewed: LABS K+ 4.6 BUN/Creat 31/1.43 TSH 5.64 WBC 6.3 H/H 13/40 plts 186 CXR mod cardiomegaly, mild bronchitic changes  Past Medical History:  Diagnosis Date  . Dementia    "maybe a little; never told us exactly" (09/03/2016)  . Hypothyroidism   . Skin cancer of face      Surgical History:  Past Surgical History:  Procedure Laterality Date  . APPENDECTOMY    . CATARACT EXTRACTION Left    "tried to put lens in but couldn't"  . CATARACT EXTRACTION W/ INTRAOCULAR LENS IMPLANT Right   . EYE SURGERY    . PARS PLANA VITRECTOMY N/A 03/01/2016   Procedure: PARS PLANA VITRECTOMY 25 GAUGE FOR ENDOPHTHALMITIS;  Surgeon: Hayden Pedro, MD;  Location: [place];  Service: Ophthalmology;  Laterality: N/A;  . SKIN CANCER EXCISION Left    face     Prescriptions Prior  to Admission  Medication Sig Dispense Refill Last Dose  . dorzolamide (TRUSOPT) 2 % ophthalmic solution Place 1 drop into the left eye 3 (three) times daily. 10 mL 12 09/03/2016 at Unknown time  . latanoprost (XALATAN) 0.005 % ophthalmic solution Place 1 drop into the right eye at bedtime.   09/02/2016 at Unknown time  . levothyroxine (SYNTHROID, LEVOTHROID) 88 MCG tablet Take 88 mcg by mouth daily.  10 09/03/2016 at Unknown time    Inpatient Medications:  . aspirin EC  81 mg Oral Daily  . dorzolamide  1 drop Left Eye TID  . heparin  5,000 Units Subcutaneous Q8H  . latanoprost  1 drop Right Eye QHS  . levothyroxine  75 mcg Oral QAC breakfast    Allergies: No Known Allergies  Social History   Social History  . Marital status: Divorced    Spouse name: N/A  . Number of children: N/A  . Years of education: N/A   Occupational History  . Not on file.   Social History Main Topics  . Smoking status: Former Smoker    Packs/day: 0.50    Years: 10.00    Types: Cigarettes  . Smokeless tobacco: Never Used     Comment: quit smoking "in 1950"  . Alcohol use Not on file     Comment: "red wine"  . Drug use: No  . Sexual activity: Not on file  Other Topics Concern  . Not on file   Social History Narrative  . No narrative on file     Family History  Problem Relation Age of Onset  . Cancer - Other Sister     breast     Review of Systems: All other systems reviewed and are otherwise negative except as noted above.  Physical Exam: Vitals:   09/03/16 2200 09/03/16 2343 09/04/16 0105 09/04/16 0419  BP:   (!) 140/53 (!) 155/46  Pulse: (!) 33 (!) 35 (!) 36 (!) 40  Resp: 18 16 18 19   Temp:    98 F (36.7 C)  TempSrc:    Oral  SpO2: 95% 98% 96% 96%  Weight:    164 lb 12.8 oz (74.8 kg)  Height:    5\' 6"  (1.676 m)    GEN- The patient is well appearing, alert and oriented x 3 today.   HEENT: normocephalic, atraumatic; sclera clear, conjunctiva pink; hearing intact; oropharynx  clear; neck supple, no JVP Lymph- no cervical lymphadenopathy CTA b/lClear to ausculation bilaterally, normal work of breathing.  No wheezes, rales, rhonchi Heart- RRR, bradycardic, no murmurs, rubs or gallops, PMI not laterally displaced GI- soft, non-tender, non-distended Extremities- no clubbing, cyanosis, or edema MS- no significant deformity or atrophy Skin- warm and dry, no rash or lesion Psych- euthymic mood, full affect Neuro- no gross deficits observed  Labs:   Lab Results  Component Value Date   WBC 6.5 09/04/2016   HGB 12.7 (L) 09/04/2016   HCT 38.6 (L) 09/04/2016   MCV 90.8 09/04/2016   PLT 174 09/04/2016     Recent Labs Lab 09/04/16 0537  NA 136  K 3.9  CL 105  CO2 24  BUN 23*  CREATININE 1.36*  CALCIUM 8.6*  GLUCOSE 102*      Radiology/Studies: No results found.  Reviewed by myself: EKG: 2:1 AVblock, V rate 38bpm, RBBB, QRS 140ms TELEMETRY: CHB, V rates 30's-40's  Echo is ordered, pending    Assessment and Plan:   1. CHB     Suspect his fall/legs "giving out" are symptomatic bradycardia     No reversible causes noted, no rate limiting/nodal blocking medicines at home or here     Discussed PPM with the patient, risks/benefits of the procedure, he is willing to proceed     Dr. Lovena Le to see  Echo is pending, will follow up BP stable    Signed, Tommye Standard, PA-C 09/04/2016 7:44 AM  EP Attending  Patient seen and examined. Agree with above. The patient has complete heart block and is symptomatic and there is no reversible causes and we will plan to place a PPM later today. I have discussed the risks/benefits/goals/expectations of PPM insertion and he wishes to proceed.   Mikle Bosworth.D.

## 2016-09-04 NOTE — Progress Notes (Signed)
    RN paged regarding patients blood pressure being elevated, otherwise asymptomatic without CP or SOB. He is post Pacemaker implantation and does not have hx of hypertension or medications scheduled or PRN. Will order PRN IV Hydralazine.

## 2016-09-04 NOTE — Progress Notes (Signed)
  Echocardiogram 2D Echocardiogram has been performed.  Tracy Castillo [name] 09/04/2016, 10:21 AM

## 2016-09-04 NOTE — Consult Note (Signed)
ELECTROPHYSIOLOGY CONSULT NOTE    Patient ID: Tracy Castillo MRN: [id number], DOB/AGE: [birth year] 83 y.o.  Admit date: 09/03/2016 Date of Consult: 09/04/2016   Primary Physician: Waldon Reining, NP Primary Cardiologist: New, Dr. Wynonia Lawman  Reason for Consultation: CHB  HPI: Tracy Castillo is a 83 y.o. male who is being seen today for the evaluation of CHB at the request of Dr. Wynonia Lawman. PMHx is noted only for hypothyroid and glaucoma, initially seen at [facility] after a fall, noted to be in CHB and suspect symptomatic bradycardia, transferred to [facility] last evening for further management.  The patient lives independently, was walking to the mailbox and when he got there his lgs lost strength and fell to his knees.  He did not have any kind of CP or SOB, he has not noted any change in his usual exertional capacity.  He did not faint.  He denies any syncope or near syncope history.  About 2 months ago he recalls a similar event that his legs seemed to give out on him.  He denies palpitations, no cardiac history or prior cardiac evaluations.  At rest this morning he is feeling well and without complaints.  [place] records reviewed: LABS K+ 4.6 BUN/Creat 31/1.43 TSH 5.64 WBC 6.3 H/H 13/40 plts 186 CXR mod cardiomegaly, mild bronchitic changes  Past Medical History:  Diagnosis Date  . Dementia    "maybe a little; never told us exactly" (09/03/2016)  . Hypothyroidism   . Skin cancer of face      Surgical History:  Past Surgical History:  Procedure Laterality Date  . APPENDECTOMY    . CATARACT EXTRACTION Left    "tried to put lens in but couldn't"  . CATARACT EXTRACTION W/ INTRAOCULAR LENS IMPLANT Right   . EYE SURGERY    . PARS PLANA VITRECTOMY N/A 03/01/2016   Procedure: PARS PLANA VITRECTOMY 25 GAUGE FOR ENDOPHTHALMITIS;  Surgeon: Hayden Pedro, MD;  Location: [place];  Service: Ophthalmology;  Laterality: N/A;  . SKIN CANCER EXCISION Left    face     Prescriptions Prior  to Admission  Medication Sig Dispense Refill Last Dose  . dorzolamide (TRUSOPT) 2 % ophthalmic solution Place 1 drop into the left eye 3 (three) times daily. 10 mL 12 09/03/2016 at Unknown time  . latanoprost (XALATAN) 0.005 % ophthalmic solution Place 1 drop into the right eye at bedtime.   09/02/2016 at Unknown time  . levothyroxine (SYNTHROID, LEVOTHROID) 88 MCG tablet Take 88 mcg by mouth daily.  10 09/03/2016 at Unknown time    Inpatient Medications:  . aspirin EC  81 mg Oral Daily  . dorzolamide  1 drop Left Eye TID  . heparin  5,000 Units Subcutaneous Q8H  . latanoprost  1 drop Right Eye QHS  . levothyroxine  75 mcg Oral QAC breakfast    Allergies: No Known Allergies  Social History   Social History  . Marital status: Divorced    Spouse name: N/A  . Number of children: N/A  . Years of education: N/A   Occupational History  . Not on file.   Social History Main Topics  . Smoking status: Former Smoker    Packs/day: 0.50    Years: 10.00    Types: Cigarettes  . Smokeless tobacco: Never Used     Comment: quit smoking "in 1950"  . Alcohol use Not on file     Comment: "red wine"  . Drug use: No  . Sexual activity: Not on file  Other Topics Concern  . Not on file   Social History Narrative  . No narrative on file     Family History  Problem Relation Age of Onset  . Cancer - Other Sister     breast     Review of Systems: All other systems reviewed and are otherwise negative except as noted above.  Physical Exam: Vitals:   09/03/16 2200 09/03/16 2343 09/04/16 0105 09/04/16 0419  BP:   (!) 140/53 (!) 155/46  Pulse: (!) 33 (!) 35 (!) 36 (!) 40  Resp: 18 16 18 19   Temp:    98 F (36.7 C)  TempSrc:    Oral  SpO2: 95% 98% 96% 96%  Weight:    164 lb 12.8 oz (74.8 kg)  Height:    5\' 6"  (1.676 m)    GEN- The patient is well appearing, alert and oriented x 3 today.   HEENT: normocephalic, atraumatic; sclera clear, conjunctiva pink; hearing intact; oropharynx  clear; neck supple, no JVP Lymph- no cervical lymphadenopathy CTA b/lClear to ausculation bilaterally, normal work of breathing.  No wheezes, rales, rhonchi Heart- RRR, bradycardic, no murmurs, rubs or gallops, PMI not laterally displaced GI- soft, non-tender, non-distended Extremities- no clubbing, cyanosis, or edema MS- no significant deformity or atrophy Skin- warm and dry, no rash or lesion Psych- euthymic mood, full affect Neuro- no gross deficits observed  Labs:   Lab Results  Component Value Date   WBC 6.5 09/04/2016   HGB 12.7 (L) 09/04/2016   HCT 38.6 (L) 09/04/2016   MCV 90.8 09/04/2016   PLT 174 09/04/2016     Recent Labs Lab 09/04/16 0537  NA 136  K 3.9  CL 105  CO2 24  BUN 23*  CREATININE 1.36*  CALCIUM 8.6*  GLUCOSE 102*      Radiology/Studies: No results found.  Reviewed by myself: EKG: 2:1 AVblock, V rate 38bpm, RBBB, QRS 110ms TELEMETRY: CHB, V rates 30's-40's  Echo is ordered, pending    Assessment and Plan:   1. CHB     Suspect his fall/legs "giving out" are symptomatic bradycardia     No reversible causes noted, no rate limiting/nodal blocking medicines at home or here     Discussed PPM with the patient, risks/benefits of the procedure, he is willing to proceed     Dr. Lovena Le to see  Echo is pending, will follow up BP stable    Signed, Tommye Standard, PA-C 09/04/2016 7:44 AM  EP Attending  Patient seen and examined. Agree with above. The patient has complete heart block and is symptomatic and there is no reversible causes and we will plan to place a PPM later today. I have discussed the risks/benefits/goals/expectations of PPM insertion and he wishes to proceed.   Mikle Bosworth.D.

## 2016-09-04 NOTE — Interval H&P Note (Signed)
History and Physical Interval Note:  09/04/2016 2:19 PM  Tracy Castillo  has presented today for surgery, with the diagnosis of hb  The various methods of treatment have been discussed with the patient and family. After consideration of risks, benefits and other options for treatment, the patient has consented to  Procedure(s): Pacemaker Implant (N/A) as a surgical intervention .  The patient's history has been reviewed, patient examined, no change in status, stable for surgery.  I have reviewed the patient's chart and labs.  Questions were answered to the patient's satisfaction.     Tracy Castillo

## 2016-09-05 ENCOUNTER — Encounter (HOSPITAL_COMMUNITY): Payer: Self-pay | Admitting: Internal Medicine

## 2016-09-05 ENCOUNTER — Inpatient Hospital Stay (HOSPITAL_COMMUNITY): Payer: Medicare Other

## 2016-09-05 MED ORDER — RISPERIDONE 0.5 MG PO TBDP
0.5000 mg | ORAL_TABLET | Freq: Once | ORAL | Status: AC
Start: 2016-09-05 — End: 2016-09-05
  Administered 2016-09-05: 0.5 mg via ORAL
  Filled 2016-09-05: qty 1

## 2016-09-05 MED ORDER — LORAZEPAM 0.5 MG PO TABS
0.5000 mg | ORAL_TABLET | Freq: Once | ORAL | Status: AC
Start: 2016-09-05 — End: 2016-09-05
  Administered 2016-09-05: 0.5 mg via ORAL
  Filled 2016-09-05: qty 1

## 2016-09-05 MED FILL — Gentamicin Sulfate Inj 40 MG/ML: INTRAMUSCULAR | Qty: 2 | Status: AC

## 2016-09-05 MED FILL — Cefazolin Sodium-Dextrose IV Solution 2 GM/100ML-4%: INTRAVENOUS | Qty: 100 | Status: AC

## 2016-09-05 MED FILL — Sodium Chloride Irrigation Soln 0.9%: Qty: 500 | Status: AC

## 2016-09-05 NOTE — Progress Notes (Signed)
Patient became very restless and attempting to get out of bed. Ripped off IV he had in place and continuously refused a new one even by IV team. Had pacemaker placed 4/4. Removed sling in place. Multiple interventions to reorient patient. Even called caregiver to see if she could sit with patient, she couldn't, instead she spoke on the phone with him. Paged MD Koleen Nimrod about patient's restlessness. Ordered ativan 0.5 mg tab PO once. Did not make much of a difference. Waited another hour, patient clearly agitated. Paged MD Koleen Nimrod, he ordered risperidone 0.5 mg disintegrating tablet PO once. Patient took it. Patient has calmed down some but still restless and stating he is ready to go home. Bed alarm on and in the lowest position. Will continue to monitor.

## 2016-09-05 NOTE — Discharge Summary (Signed)
ELECTROPHYSIOLOGY PROCEDURE DISCHARGE SUMMARY    Patient ID: Tracy Castillo,  MRN: [id number], DOB/AGE: 81/01/1925 81 y.o.  Admit date: 09/03/2016 Discharge date: 09/05/2016  Primary Care Physician: Waldon Reining, NP  Primary Cardiologist: new, Dr. Wynonia Lawman Electrophysiologist: new, Dr. Lovena Le  Primary Discharge Diagnosis:  1. Symptomatic bradycardia 2. CHB  Secondary Discharge Diagnosis:  1. Hypothyroid 2. Glaucoma  No Known Allergies   Procedures This Admission:  1.  Implantation of a MDT dual chamber PPM on 09/04/16 by Dr Lovena Le.  The patient received a Medtronic (serial number [id number]) pacemaker, Medtronic (serial number [id number]) right atrial lead and a Medtronic (serial number [id number]) right ventricular lead. There were no immediate post procedure complications. 2.  CXR on 09/05/16 demonstrated no pneumothorax status post device implantation.   Brief HPI: Tracy Castillo is a 83 y.o. male arrived to ER after a weak spell that he felt his legs gave out, noted to be in hgh degree AVblock, 2:1 as well as CHB observed with V rates 30's-40's, admitted for further management and evaluation   Hospital Course:  The patient was lives independently, was walking to the mailbox and when he got there his lgs lost strength and fell to his knees.  He did not have any kind of CP or SOB, he has not noted any change in his usual exertional capacity.  He did not faint.  He denied any syncope or near syncope history.  About 2 months ago he recalled a similar event that his legs seemed to give out on him.  He denied palpitations, no cardiac history or prior cardiac evaluations. No reversible causes for his heart block were noted and he underwent implantation of a PPM with details as outlined above.  He was monitored on telemetry overnight which demonstrated SR w/ V paced rhythm.  Left chest was without hematoma or ecchymosis.  The device was interrogated and found to be functioning normally.   CXR was obtained and demonstrated no pneumothorax status post device implantation.  The patient had post sedation/nighttime confusion (with some degree of baseline dementia listed in his history), received ativan/risperidone last night, this morning he is sleepy but easily woken, he has ambulated well and without need of assistance in the hallway (walked the entire floor) and observed/reported by the RN to do very well.  Wound care, arm mobility, and restrictions were reviewed with the patient/and provided in written instruction as well.  The patient was examined by Dr. Lovena Le and considered stable for discharge to home, with his significant other who he lives with.     Physical Exam: Vitals:   09/05/16 0100 09/05/16 0350 09/05/16 0644 09/05/16 0836  BP: (!) 174/79 (!) 184/86 (!) 176/72 (!) 112/50  Pulse: 75 77    Resp: 20 17 (!) 21 18  Temp:  98.6 F (37 C)    TempSrc: Oral Oral    SpO2: 100% 98%    Weight:  163 lb (73.9 kg)    Height:  5\' 6"  (1.676 m)      GEN- The patient is well appearing, sleepy, oriented to self, place, able to re-orient to situation    HEENT: normocephalic, atraumatic; sclera clear, conjunctiva pink; hearing intact; oropharynx clear; neck supple, no JVP Lungs- CTA b/l, normal work of breathing.  No wheezes, rales, rhonchi Heart- RRR, no murmurs, rubs or gallops, PMI not laterally displaced GI- soft, non-tender, non-distended Extremities- no clubbing, cyanosis, or edema MS- no significant deformity, age appropriate atrophy Skin- warm and dry, no  rash or lesion, left chest without hematoma/ecchymosis Psych- euthymic mood, full affect Neuro- no gross deficits   Labs:   Lab Results  Component Value Date   WBC 6.5 09/04/2016   HGB 12.7 (L) 09/04/2016   HCT 38.6 (L) 09/04/2016   MCV 90.8 09/04/2016   PLT 174 09/04/2016     Recent Labs Lab 09/04/16 0537  NA 136  K 3.9  CL 105  CO2 24  BUN 23*  CREATININE 1.36*  CALCIUM 8.6*  GLUCOSE 102*     Discharge Medications:  Allergies as of 09/05/2016   No Known Allergies     Medication List    TAKE these medications   dorzolamide 2 % ophthalmic solution Commonly known as:  TRUSOPT Place 1 drop into the left eye 3 (three) times daily.   latanoprost 0.005 % ophthalmic solution Commonly known as:  XALATAN Place 1 drop into the right eye at bedtime.   levothyroxine 88 MCG tablet Commonly known as:  SYNTHROID, LEVOTHROID Take 88 mcg by mouth daily.       Disposition:   Home Discharge Instructions    Diet - low sodium heart healthy    Complete by:  As directed    Increase activity slowly    Complete by:  As directed      Follow-up Information    [facility] Follow up on 09/18/2016.   Specialty:  Cardiology Why:  2:00PM, wound check Contact information: [address] [place]       [name], MD Follow up on 12/02/2016.   Specialty:  Cardiology Why:  12:15PM Contact information: [address] [place] [address] [phone]           Duration of Discharge Encounter: Greater than 30 minutes including physician time.  Venetia Night, PA-C 09/05/2016 10:39 AM  EP Attending  Patient seen and examined. Agree with above. He is stable after PPM. Note a little agitation last night which has resolved. Usual followup.  Mikle Bosworth.D.

## 2016-09-05 NOTE — Discharge Instructions (Signed)
Supplemental Discharge Instructions for  Pacemaker/Defibrillator Patients  Activity No heavy lifting or vigorous activity with your left/right arm for 6 to 8 weeks.  Do not raise your left/right arm above your head for one week.  Gradually raise your affected arm as drawn below.             09/08/16                       09/09/16                       09/10/16                  09/11/16 __  NO DRIVING for 1 week  ; you may begin driving on  12/10/60  .  WOUND CARE - Keep the wound area clean and dry.  Do not get this area wet for one week. No showers for one week; you may shower on  09/11/16  . - The tape/steri-strips on your wound will fall off; do not pull them off.  No bandage is needed on the site.  DO  NOT apply any creams, oils, or ointments to the wound area. - If you notice any drainage or discharge from the wound, any swelling or bruising at the site, or you develop a fever > 101? F after you are discharged home, call the office at once.  Special Instructions - You are still able to use cellular telephones; use the ear opposite the side where you have your pacemaker/defibrillator.  Avoid carrying your cellular phone near your device. - When traveling through airports, show security personnel your identification card to avoid being screened in the metal detectors.  Ask the security personnel to use the hand wand. - Avoid arc welding equipment, MRI testing (magnetic resonance imaging), TENS units (transcutaneous nerve stimulators).  Call the office for questions about other devices. - Avoid electrical appliances that are in poor condition or are not properly grounded. - Microwave ovens are safe to be near or to operate.  Additional information for defibrillator patients should your device go off: - If your device goes off ONCE and you feel fine afterward, notify the device clinic nurses. - If your device goes off ONCE and you do not feel well afterward, call 911. - If your device goes  off TWICE, call 911. - If your device goes off THREE times in one day, call 911.  DO NOT DRIVE YOURSELF OR A FAMILY MEMBER WITH A DEFIBRILLATOR TO THE HOSPITAL--CALL 911.     Pacemaker Implantation, Adult, Care After This sheet gives you information about how to care for yourself after your procedure. Your health care provider may also give you more specific instructions. If you have problems or questions, contact your health care provider. What can I expect after the procedure? After the procedure, it is common to have:  Mild pain.  Slight bruising.  Some swelling over the incision.  A slight bump over the skin where the device was placed. Sometimes, it is possible to feel the device under the skin. This is normal. Follow these instructions at home: Medicines   Take over-the-counter and prescription medicines only as told by your health care provider.  If you were prescribed an antibiotic medicine, take it as told by your health care provider. Do not stop taking the antibiotic even if you start to feel better. Wound care   Do not remove the bandage  on your chest until directed to do so by your health care provider.  After your bandage is removed, you may see pieces of tape called skin adhesive strips over the area where the cut was made (incision site). Let them fall off on their own.  Check the incision site every day to make sure it is not infected, bleeding, or starting to pull apart.  Do not use lotions or ointments near the incision site unless directed to do so.  Keep the incision area clean and dry for 2-3 days after the procedure or as directed by your health care provider. It takes several weeks for the incision site to completely heal.  Do not take baths, swim, or use a hot tub for 7-10 days or as otherwise directed by your health care provider. Activity   Do not drive or use heavy machinery while taking prescription pain medicine.  Do not drive for 24 hours if  you were given a medicine to help you relax (sedative).  Check with your health care provider before you start to drive or play sports.  Avoid sudden jerking, pulling, or chopping movements that pull your upper arm far away from your body. Avoid these movements for at least 6 weeks or as long as told by your health care provider.  Do not lift your upper arm above your shoulders for at least 6 weeks or as long as told by your health care provider. This means no tennis, golf, or swimming.  You may go back to work when your health care provider says it is okay. Pacemaker care   You may be shown how to transfer data from your pacemaker through the phone to your health care provider.  Always let all health care providers know about your pacemaker before you have any medical procedures or tests.  Wear a medical ID bracelet or necklace stating that you have a pacemaker. Carry a pacemaker ID card with you at all times.  Your pacemaker battery will last for 5-15 years. Routine checks by your health care provider will let the health care provider know when the battery is starting to run down. The pacemaker will need to be replaced when the battery starts to run down.  Do not use amateur Chief of Staff. Other electrical devices are safe to use, including power tools, lawn mowers, and speakers. If you are unsure of whether something is safe to use, ask your health care provider.  When using your cell phone, hold it to the ear opposite the pacemaker. Do not leave your cell phone in a pocket over the pacemaker.  Avoid places or objects that have a strong electric or magnetic field, including:  Engineer, maintenance. When at the airport, let officials know that you have a pacemaker.  Power plants.  Large electrical generators.  Radiofrequency transmission towers, such as cell phone and radio towers. General instructions   Weigh yourself every day. If you suddenly gain  weight, fluid may be building up in your body.  Keep all follow-up visits as told by your health care provider. This is important. Contact a health care provider if:  You gain weight suddenly.  Your legs or feet swell.  It feels like your heart is fluttering or skipping beats (heart palpitations).  You have chills or a fever.  You have more redness, swelling, or pain around your incisions.  You have more fluid or blood coming from your incisions.  Your incisions feel warm to the  touch.  You have pus or a bad smell coming from your incisions. Get help right away if:  You have chest pain.  You have trouble breathing or are short of breath.  You become extremely tired.  You are light-headed or you faint. This information is not intended to replace advice given to you by your health care provider. Make sure you discuss any questions you have with your health care provider. Document Released: 12/07/2004 Document Revised: 03/01/2016 Document Reviewed: 03/01/2016 Elsevier Interactive Patient Education  2017 [employer].

## 2016-09-18 ENCOUNTER — Ambulatory Visit (INDEPENDENT_AMBULATORY_CARE_PROVIDER_SITE_OTHER): Payer: Medicare Other | Admitting: *Deleted

## 2016-09-18 DIAGNOSIS — I442 Atrioventricular block, complete: Secondary | ICD-10-CM

## 2016-09-18 DIAGNOSIS — R001 Bradycardia, unspecified: Secondary | ICD-10-CM

## 2016-09-18 LAB — CUP PACEART INCLINIC DEVICE CHECK
Battery Remaining Longevity: 75 mo
Brady Statistic AP VS Percent: 0 %
Brady Statistic AS VS Percent: 0.29 %
Brady Statistic RV Percent Paced: 99.71 %
Date Time Interrogation Session: 20180418145348
Implantable Lead Implant Date: 20180404
Implantable Lead Implant Date: 20180404
Implantable Lead Location: 753859
Implantable Lead Location: 753860
Lead Channel Impedance Value: 399 Ohm
Lead Channel Impedance Value: 532 Ohm
Lead Channel Pacing Threshold Amplitude: 1 V
Lead Channel Pacing Threshold Pulse Width: 0.4 ms
Lead Channel Pacing Threshold Pulse Width: 1 ms
Lead Channel Sensing Intrinsic Amplitude: 2.75 mV
Lead Channel Sensing Intrinsic Amplitude: 6 mV
Lead Channel Setting Pacing Amplitude: 3.5 V
Lead Channel Setting Pacing Pulse Width: 1 ms
MDC IDC MSMT BATTERY VOLTAGE: 3.18 V
MDC IDC MSMT LEADCHNL RA PACING THRESHOLD AMPLITUDE: 0.75 V
MDC IDC MSMT LEADCHNL RA SENSING INTR AMPL: 2.25 mV
MDC IDC MSMT LEADCHNL RV IMPEDANCE VALUE: 342 Ohm
MDC IDC MSMT LEADCHNL RV IMPEDANCE VALUE: 456 Ohm
MDC IDC PG IMPLANT DT: 20180404
MDC IDC SET LEADCHNL RA PACING AMPLITUDE: 3.5 V
MDC IDC SET LEADCHNL RV SENSING SENSITIVITY: 4 mV
MDC IDC STAT BRADY AP VP PERCENT: 32.43 %
MDC IDC STAT BRADY AS VP PERCENT: 67.28 %
MDC IDC STAT BRADY RA PERCENT PACED: 32.52 %

## 2016-09-18 NOTE — Progress Notes (Signed)
Wound check appointment. Steri-strips removed. Wound without redness or edema. Incision edges approximated, wound well healed. Normal device function. Thresholds, sensing, and impedances consistent with implant measurements. Device programmed at 3.5V for extra safety margin until 3 month visit. Non-selective capture 5V- 1V. RV threshold 1V @1ms . Histogram distribution appropriate for patient and level of activity. No mode switches or high ventricular rates noted. Patient educated about wound care, arm mobility, lifting restrictions. ROV with AS 10/30/16, GT 12/02/16.

## 2016-09-24 ENCOUNTER — Encounter: Payer: Self-pay | Admitting: Internal Medicine

## 2016-10-27 ENCOUNTER — Encounter: Payer: Self-pay | Admitting: Nurse Practitioner

## 2016-10-27 NOTE — Progress Notes (Deleted)
Electrophysiology Office Note Date: 10/27/2016  ID:  Tracy Castillo, DOB 1925/03/10, MRN [id number]  PCP: Waldon Reining, NP Primary Cardiologist: Wynonia Lawman Electrophysiologist: Tracy Castillo  CC: 6 week His Bundle pacing follow up  Tracy Castillo is a 83 y.o. male seen today for Dr Tracy Castillo.  He was admitted 09/2016 for symptomatic complete heart block and underwent His Bundle pacing at that time.  Since discharge, the patient reports doing very well.  He denies chest pain, palpitations, dyspnea, PND, orthopnea, nausea, vomiting, dizziness, syncope, edema, weight gain, or early satiety.  Device History: MDT dual chamber PPM implanted 2018 for complete heart block (His Bundle lead)   Past Medical History:  Diagnosis Date  . Complete heart block (HCC)    a. s/p MDT dual chamber His Bundle pacemaker - Dr Tracy Castillo  . Dementia    "maybe a little; never told us exactly" (09/03/2016)  . Hypothyroidism   . Skin cancer of face    Past Surgical History:  Procedure Laterality Date  . APPENDECTOMY    . CATARACT EXTRACTION Left    "tried to put lens in but couldn't"  . CATARACT EXTRACTION W/ INTRAOCULAR LENS IMPLANT Right   . PACEMAKER IMPLANT N/A 09/04/2016   MDT dual chamber PPM with His Bundle pacing lead implanted by Dr Tracy Castillo for complete heart block  . PARS PLANA VITRECTOMY N/A 03/01/2016   Procedure: PARS PLANA VITRECTOMY 25 GAUGE FOR ENDOPHTHALMITIS;  Surgeon: Hayden Pedro, MD;  Location: [facility];  Service: Ophthalmology;  Laterality: N/A;  . SKIN CANCER EXCISION Left    face    Current Outpatient Prescriptions  Medication Sig Dispense Refill  . dorzolamide (TRUSOPT) 2 % ophthalmic solution Place 1 drop into the left eye 3 (three) times daily. 10 mL 12  . latanoprost (XALATAN) 0.005 % ophthalmic solution Place 1 drop into the right eye at bedtime.    Marland Kitchen levothyroxine (SYNTHROID, LEVOTHROID) 88 MCG tablet Take 88 mcg by mouth daily.  10   No current facility-administered medications for  this visit.     Allergies:   Patient has no known allergies.   Social History: Social History   Social History  . Marital status: Divorced    Spouse name: N/A  . Number of children: N/A  . Years of education: N/A   Occupational History  . Not on file.   Social History Main Topics  . Smoking status: Former Smoker    Packs/day: 0.50    Years: 10.00    Types: Cigarettes  . Smokeless tobacco: Never Used     Comment: quit smoking "in 1950"  . Alcohol use Not on file     Comment: "red wine"  . Drug use: No  . Sexual activity: Not on file   Other Topics Concern  . Not on file   Social History Narrative  . No narrative on file    Family History: Family History  Problem Relation Age of Onset  . Cancer - Other Sister        breast     Review of Systems: All other systems reviewed and are otherwise negative except as noted above.   Physical Exam: VS:  There were no vitals taken for this visit. , BMI There is no height or weight on file to calculate BMI.  GEN- The patient is well appearing, alert and oriented x 3 today.   HEENT: normocephalic, atraumatic; sclera clear, conjunctiva pink; hearing intact; oropharynx clear; neck supple, no JVP Lymph- no cervical lymphadenopathy Lungs-  Clear to ausculation bilaterally, normal work of breathing.  No wheezes, rales, rhonchi Heart- Regular rate and rhythm, no murmurs, rubs or gallops, PMI not laterally displaced GI- soft, non-tender, non-distended, bowel sounds present, no hepatosplenomegaly Extremities- no clubbing, cyanosis, or edema; DP/PT/radial pulses 2+ bilaterally MS- no significant deformity or atrophy Skin- warm and dry, no rash or lesion; PPM pocket well healed Psych- euthymic mood, full affect Neuro- strength and sensation are intact  PPM Interrogation- reviewed in detail today,  See PACEART report  EKG:  EKG is ordered today. The ekg ordered today shows ***  Recent Labs: 09/04/2016: BUN 23; Creatinine, Ser  1.36; Hemoglobin 12.7; Platelets 174; Potassium 3.9; Sodium 136   Wt Readings from Last 3 Encounters:  09/05/16 163 lb (73.9 kg)  03/01/16 160 lb (72.6 kg)     Other studies Reviewed: Additional studies/ records that were reviewed today include: hospital records   Assessment and Plan:  1.  Complete heart block Normal PPM function See Pace Art report No changes today His Bundle capture ***   Current medicines are reviewed at length with the patient today.   The patient does not have concerns regarding his medicines.  The following changes were made today:  none  Labs/ tests ordered today include: none No orders of the defined types were placed in this encounter.    Disposition:   Follow up with Dr Tracy Castillo as scheduled     Signed, Chanetta Marshall, NP 10/27/2016 7:47 PM  [facility] [address] [place] [address] [phone] (office) [phone] (fax

## 2016-10-29 ENCOUNTER — Encounter: Payer: Medicare Other | Admitting: Nurse Practitioner

## 2016-10-30 ENCOUNTER — Encounter: Payer: Self-pay | Admitting: Nurse Practitioner

## 2016-10-30 ENCOUNTER — Encounter: Payer: Medicare Other | Admitting: Nurse Practitioner

## 2016-12-02 ENCOUNTER — Encounter: Payer: Medicare Other | Admitting: Internal Medicine

## 2016-12-31 ENCOUNTER — Encounter: Payer: Self-pay | Admitting: Internal Medicine

## 2016-12-31 ENCOUNTER — Ambulatory Visit (INDEPENDENT_AMBULATORY_CARE_PROVIDER_SITE_OTHER): Payer: Medicare Other | Admitting: Internal Medicine

## 2016-12-31 VITALS — BP 150/76 | HR 59 | Ht 66.0 in | Wt 164.8 lb

## 2016-12-31 DIAGNOSIS — I442 Atrioventricular block, complete: Secondary | ICD-10-CM | POA: Diagnosis not present

## 2016-12-31 DIAGNOSIS — R001 Bradycardia, unspecified: Secondary | ICD-10-CM

## 2016-12-31 LAB — CUP PACEART INCLINIC DEVICE CHECK
Battery Remaining Longevity: 70 mo
Battery Voltage: 3.04 V
Brady Statistic AP VP Percent: 23.04 %
Brady Statistic AS VP Percent: 76.47 %
Brady Statistic AS VS Percent: 0.49 %
Brady Statistic RV Percent Paced: 99.51 %
Date Time Interrogation Session: 20180731144154
Implantable Lead Location: 753859
Implantable Lead Model: 3830
Implantable Pulse Generator Implant Date: 20180404
Lead Channel Impedance Value: 342 Ohm
Lead Channel Impedance Value: 418 Ohm
Lead Channel Pacing Threshold Amplitude: 0.75 V
Lead Channel Pacing Threshold Pulse Width: 0.4 ms
Lead Channel Sensing Intrinsic Amplitude: 2.125 mV
Lead Channel Setting Pacing Amplitude: 1.5 V
Lead Channel Setting Pacing Pulse Width: 1 ms
MDC IDC LEAD IMPLANT DT: 20180404
MDC IDC LEAD IMPLANT DT: 20180404
MDC IDC LEAD LOCATION: 753860
MDC IDC MSMT LEADCHNL RA IMPEDANCE VALUE: 399 Ohm
MDC IDC MSMT LEADCHNL RA PACING THRESHOLD AMPLITUDE: 0.75 V
MDC IDC MSMT LEADCHNL RV IMPEDANCE VALUE: 304 Ohm
MDC IDC MSMT LEADCHNL RV PACING THRESHOLD PULSEWIDTH: 0.4 ms
MDC IDC MSMT LEADCHNL RV SENSING INTR AMPL: 5.25 mV
MDC IDC SET LEADCHNL RV PACING AMPLITUDE: 3.5 V
MDC IDC SET LEADCHNL RV SENSING SENSITIVITY: 4 mV
MDC IDC STAT BRADY AP VS PERCENT: 0 %
MDC IDC STAT BRADY RA PERCENT PACED: 23.24 %

## 2016-12-31 NOTE — Progress Notes (Signed)
HPI Mr. Snuffer returns today for followup. He is a pleasant 83 yo man with a h/o syncope and found to have CHB, who undewent PPM insertion several months ago. In the interim, he has done well with no chest pain or sob. No syncope. No edema. He remains active. He has no complaints today.  No Known Allergies   Current Outpatient Prescriptions  Medication Sig Dispense Refill  . dorzolamide (TRUSOPT) 2 % ophthalmic solution Place 1 drop into the left eye 3 (three) times daily. 10 mL 12  . latanoprost (XALATAN) 0.005 % ophthalmic solution Place 1 drop into the right eye at bedtime.    Marland Kitchen levothyroxine (SYNTHROID, LEVOTHROID) 88 MCG tablet Take 88 mcg by mouth daily.  10   No current facility-administered medications for this visit.      Past Medical History:  Diagnosis Date  . Complete heart block (HCC)    a. s/p MDT dual chamber His Bundle pacemaker - Dr Lovena Le  . Dementia    "maybe a little; never told us exactly" (09/03/2016)  . Hypothyroidism   . Skin cancer of face     ROS:   All systems reviewed and negative except as noted in the HPI.   Past Surgical History:  Procedure Laterality Date  . APPENDECTOMY    . CATARACT EXTRACTION Left    "tried to put lens in but couldn't"  . CATARACT EXTRACTION W/ INTRAOCULAR LENS IMPLANT Right   . PACEMAKER IMPLANT N/A 09/04/2016   MDT dual chamber PPM with His Bundle pacing lead implanted by Dr Lovena Le for complete heart block  . PARS PLANA VITRECTOMY N/A 03/01/2016   Procedure: PARS PLANA VITRECTOMY 25 GAUGE FOR ENDOPHTHALMITIS;  Surgeon: Hayden Pedro, MD;  Location: [facility];  Service: Ophthalmology;  Laterality: N/A;  . SKIN CANCER EXCISION Left    face     Family History  Problem Relation Age of Onset  . Cancer - Other Sister        breast     Social History   Social History  . Marital status: Divorced    Spouse name: N/A  . Number of children: N/A  . Years of education: N/A   Occupational History  . Not on file.     Social History Main Topics  . Smoking status: Former Smoker    Packs/day: 0.50    Years: 10.00    Types: Cigarettes  . Smokeless tobacco: Never Used     Comment: quit smoking "in 1950"  . Alcohol use Not on file     Comment: "red wine"  . Drug use: No  . Sexual activity: Not on file   Other Topics Concern  . Not on file   Social History Narrative  . No narrative on file     BP (!) 150/76   Pulse (!) 59   Ht 5\' 6"  (1.676 m)   Wt 164 lb 12.8 oz (74.8 kg)   SpO2 97%   BMI 26.60 kg/m   Physical Exam:  Well appearing elderly man, NAD HEENT: Unremarkable Neck:  6 cm JVD, no thyromegally Lymphatics:  No adenopathy Back:  No CVA tenderness Lungs:  Clear with no wheezes, well healed PPM incision HEART:  Regular rate rhythm, no murmurs, no rubs, no clicks Abd:  soft, positive bowel sounds, no organomegally, no rebound, no guarding Ext:  2 plus pulses, no edema, no cyanosis, no clubbing Skin:  No rashes no nodules Neuro:  CN II through XII intact,  motor grossly intact  EKG - nsr with ventricular pacing  DEVICE  Normal device function.  See PaceArt for details.   Assess/Plan: 1. chb - he is asymptomatic s/p PPM insertion 2. HTN - his blood pressure is not well controlled. He admits to some sodium indiscretion. I have asked him to reduce his salt intake. 3. PPM - his medtronic DDD PM is working normally. Will recheck in several months.  [name], M.D.

## 2016-12-31 NOTE — Patient Instructions (Addendum)
Medication Instructions:  Your physician recommends that you continue on your current medications as directed. Please refer to the Current Medication list given to you today.   Labwork: None ordered.   Testing/Procedures: None ordered.   Follow-Up: Your physician wants you to follow-up in: 9 months with Dr. Taylor.  You will receive a reminder letter in the mail two months in advance. If you don't receive a letter, please call our office to schedule the follow-up appointment.  Remote monitoring is used to monitor your Pacemaker from home. This monitoring reduces the number of office visits required to check your device to one time per year. It allows us to keep an eye on the functioning of your device to ensure it is working properly. You are scheduled for a device check from home on 04/01/2017. You may send your transmission at any time that day. If you have a wireless device, the transmission will be sent automatically. After your physician reviews your transmission, you will receive a postcard with your next transmission date.    Any Other Special Instructions Will Be Listed Below (If Applicable).     If you need a refill on your cardiac medications before your next appointment, please call your pharmacy.   

## 2017-04-01 ENCOUNTER — Encounter: Payer: Medicare Other | Admitting: *Deleted

## 2017-04-04 ENCOUNTER — Encounter: Payer: Self-pay | Admitting: Cardiology

## 2017-08-09 DIAGNOSIS — R55 Syncope and collapse: Secondary | ICD-10-CM | POA: Insufficient documentation

## 2017-08-09 DIAGNOSIS — R42 Dizziness and giddiness: Secondary | ICD-10-CM | POA: Insufficient documentation

## 2017-08-15 ENCOUNTER — Encounter: Payer: Medicare Other | Admitting: *Deleted

## 2017-08-15 ENCOUNTER — Telehealth: Payer: Self-pay | Admitting: Cardiology

## 2017-08-15 NOTE — Telephone Encounter (Signed)
Spoke with pt and reminded pt of remote transmission that is due today. Pt verbalized understanding.   

## 2017-08-19 ENCOUNTER — Encounter: Payer: Self-pay | Admitting: Cardiology

## 2017-09-04 DIAGNOSIS — W19XXXA Unspecified fall, initial encounter: Secondary | ICD-10-CM | POA: Insufficient documentation

## 2017-09-04 DIAGNOSIS — Z8673 Personal history of transient ischemic attack (TIA), and cerebral infarction without residual deficits: Secondary | ICD-10-CM | POA: Insufficient documentation

## 2017-09-04 DIAGNOSIS — I479 Paroxysmal tachycardia, unspecified: Secondary | ICD-10-CM | POA: Insufficient documentation

## 2017-09-04 DIAGNOSIS — I6521 Occlusion and stenosis of right carotid artery: Secondary | ICD-10-CM | POA: Insufficient documentation

## 2017-09-26 ENCOUNTER — Encounter: Payer: Medicare Other | Admitting: Physician Assistant

## 2017-09-28 NOTE — Progress Notes (Deleted)
Cardiology Office Note Date:  09/28/2017  Patient ID:  Tracy Castillo, Tracy Castillo July 21, 1924, MRN [id number] PCP:  Waldon Reining, NP  Cardiologist:  Dr. Lovena Le  ***refresh   Chief Complaint: Routine EP visit  History of Present Illness: Tracy Castillo is a 84 y.o. male with history of CHB/PPM, hypothyroidism, HTN comes to the office today to be seen for Dr. Lovena Le.  He last saw Dr. Lovena Le in July 2018, this was a routine visit, no new issues were noted, no changes were made to his therapy, BP was elevated and recommended low sodium diet.  *** symptoms *** meds *** labs  Device information: MDT dual chamber PPM, implanted 09/04/16   Past Medical History:  Diagnosis Date  . Complete heart block (HCC)    a. s/p MDT dual chamber His Bundle pacemaker - Dr Lovena Le  . Dementia    "maybe a little; never told us exactly" (09/03/2016)  . Hypothyroidism   . Skin cancer of face     Past Surgical History:  Procedure Laterality Date  . APPENDECTOMY    . CATARACT EXTRACTION Left    "tried to put lens in but couldn't"  . CATARACT EXTRACTION W/ INTRAOCULAR LENS IMPLANT Right   . PACEMAKER IMPLANT N/A 09/04/2016   MDT dual chamber PPM with His Bundle pacing lead implanted by Dr Lovena Le for complete heart block  . PARS PLANA VITRECTOMY N/A 03/01/2016   Procedure: PARS PLANA VITRECTOMY 25 GAUGE FOR ENDOPHTHALMITIS;  Surgeon: Hayden Pedro, MD;  Location: [facility];  Service: Ophthalmology;  Laterality: N/A;  . SKIN CANCER EXCISION Left    face    Current Outpatient Medications  Medication Sig Dispense Refill  . dorzolamide (TRUSOPT) 2 % ophthalmic solution Place 1 drop into the left eye 3 (three) times daily. 10 mL 12  . latanoprost (XALATAN) 0.005 % ophthalmic solution Place 1 drop into the right eye at bedtime.    Marland Kitchen levothyroxine (SYNTHROID, LEVOTHROID) 88 MCG tablet Take 88 mcg by mouth daily.  10   No current facility-administered medications for this visit.     Allergies:   Patient has  no known allergies.   Social History:  The patient  reports that he has quit smoking. His smoking use included cigarettes. He has a 5.00 pack-year smoking history. He has never used smokeless tobacco. He reports that he does not use drugs.   Family History:  The patient's family history includes Cancer - Other in his sister.  ROS:  Please see the history of present illness.  All other systems are reviewed and otherwise negative.   PHYSICAL EXAM: *** VS:  There were no vitals taken for this visit. BMI: There is no height or weight on file to calculate BMI. Well nourished, well developed, in no acute distress  HEENT: normocephalic, atraumatic  Neck: no JVD, carotid bruits or masses Cardiac:  *** RRR; no significant murmurs, no rubs, or gallops Lungs:  *** CTA b/l, no wheezing, rhonchi or rales  Abd: soft, nontender MS: no deformity or *** atrophy Ext: ***  no edema  Skin: warm and dry, no rash Neuro:  No gross deficits appreciated Psych: euthymic mood, full affect  *** PPM site is stable, no tethering or discomfort   EKG:  Done today shows *** PPM interrogation done today and reviewed by myself: ***  09/04/16: TTE Study Conclusions - Left ventricle: The cavity size was normal. There was mild   concentric hypertrophy. Systolic function was normal. The   estimated ejection fraction  was in the range of 60% to 65%. Wall   motion was normal; there were no regional wall motion   abnormalities. Doppler parameters are consistent with abnormal   left ventricular relaxation (grade 1 diastolic dysfunction).   Doppler parameters are consistent with elevated ventricular   end-diastolic filling pressure. - Aortic valve: Moderately thickened, moderately calcified   leaflets. Valve mobility was restricted. There was mild stenosis.   There was moderate regurgitation. Mean gradient (S): 9 mm Hg.   Valve area (VTI): 1.15 cm^2. Valve area (Vmax): 1.96 cm^2. Valve   area (Vmean): 1.24 cm^2. -  Aortic root: The aortic root was normal in size. - Mitral valve: Calcified annulus. Mildly thickened leaflets .   There was mild regurgitation. - Left atrium: The atrium was normal in size. - Right ventricle: The cavity size was mildly dilated. Wall   thickness was normal. Systolic function was normal. - Right atrium: The atrium was normal in size. - Tricuspid valve: There was moderate regurgitation. - Pulmonary arteries: Systolic pressure was moderately increased.   PA peak pressure: 56 mm Hg (S). - Inferior vena cava: The vessel was normal in size. - Pericardium, extracardiac: There was no pericardial effusion.   Recent Labs: No results found for requested labs within last 8760 hours.  No results found for requested labs within last 8760 hours.   CrCl cannot be calculated (Patient's most recent lab result is older than the maximum 21 days allowed.).   Wt Readings from Last 3 Encounters:  12/31/16 164 lb 12.8 oz (74.8 kg)  09/05/16 163 lb (73.9 kg)  03/01/16 160 lb (72.6 kg)     Other studies reviewed: Additional studies/records reviewed today include: summarized above  ASSESSMENT AND PLAN:  1. PPM     ***  2. HTN     ***   Disposition: F/u with ***  Current medicines are reviewed at length with the patient today.  The patient did not have any concerns regarding medicines.  Venetia Night, PA-C 09/28/2017 12:48 PM     [place] [address] [phone] (office)  [phone] (fax)

## 2017-10-01 ENCOUNTER — Ambulatory Visit: Payer: Medicare Other | Admitting: Physician Assistant

## 2017-11-05 ENCOUNTER — Encounter (INDEPENDENT_AMBULATORY_CARE_PROVIDER_SITE_OTHER): Payer: Self-pay

## 2017-11-05 ENCOUNTER — Ambulatory Visit (INDEPENDENT_AMBULATORY_CARE_PROVIDER_SITE_OTHER): Payer: Medicare Other | Admitting: Physician Assistant

## 2017-11-05 VITALS — BP 138/72 | HR 63 | Ht 66.0 in | Wt 164.0 lb

## 2017-11-05 DIAGNOSIS — Z950 Presence of cardiac pacemaker: Secondary | ICD-10-CM | POA: Diagnosis not present

## 2017-11-05 DIAGNOSIS — I480 Paroxysmal atrial fibrillation: Secondary | ICD-10-CM

## 2017-11-05 DIAGNOSIS — I442 Atrioventricular block, complete: Secondary | ICD-10-CM

## 2017-11-05 NOTE — Progress Notes (Signed)
Cardiology Office Note Date:  11/05/2017  Patient ID:  Tracy Castillo, DOB 1924-10-09, MRN [id number] PCP:  Waldon Reining, NP  Electrophysiologist: Dr. Lovena Le Cardiologist: Dr. [place] Lawman    Chief Complaint: 1-year device visit  History of Present Illness: [place] is a 84 y.o. male with history of hypothyroidism, glaucoma, mild baseline dementia is described, CHB s/p PPM last last year.  He comes in today to be seen for Dr. Lovena Le.  Last seen by him in July 2018 at his post pacer implant visit.  He was doing well, no programming changes, BP was elevated and counseled on sodium restriction.  The patient comes today accompanied by his son-in-law who he lives with and helps care for him.  The patient in April had an event where he had somewhat sudden onset of b/l LE weakness.  This has happened before, as well.  He reports that he remains awake and alert just has profound b/l LE weakness, he was evaluated at [facility] ER:  "on 08/09/17 for this. He had a CT head which showed age indeterminate infarcts in basal ganglia/thalami as well as left cerebellar infarct. CTA head/neck showing Combination of calcified and noncalcified atherosclerotic plaque in the bilateral carotid bulbs contributing to stenosis of the origin of the right internal carotid artery estimated at 90% with good flow distally within the right internal carotid artery. Stenosis at the origin of the left common carotid artery estimated at less than 50% with good flow distally. He was evaluated by neurology who felt symptoms were secondary to postural dizziness with presyncope."    He returned to baseline and was discharged apparently.  The following day he again had trouble with his legs.  Later after returning from wokr his son-in-law reported that the pt told him that his ex-wife had visited though she had not and then about 2AM woke to the patient calling for help from the bathroom.   [facility] record: "He found him face down on  bathroom floor surrounded by vomit. Pt reported that he did not know how he fell but had been down for several hours. Patient was unable to get up but appeared very weak. Son reports no recent illnesses, loss of appetite, changes in medications, chest pain, SOB, cough, diarrhea.  In the ED, troponin was 0.034, but downtrended to 0.018. Labs normal. UA negative for infection. Head/chest/abdomen/pelvis CT negative for acute pathology. Pacemaker was interrogated and showed event at 1120 which lasted 2 minutes and 31 seconds. Atrial heart rate 152 and ventricular rate 115."  In review cardiology did not feel brief AF contributed in any way.  The patient was felt a fall risk and not started on a/c.  During his stay he did have some delirium/aggitation and started on Seroquel, eventually stopped 2/2 sedation effects.  He has had f/u with PMD and is pending neurology follow up.  The patient yesterday fell.  He states he was bent over to get something and fell, he is certain that he did not fall, said he is just clumsy and was not paying attention.  When I ask about the even in the bathroom he tells me he is definitely not fainting or blacking out and tells me that he would tell me if he was.  His son-in-law reports no known fainting spells since the pacer implant.     Device information MDT dual chamber PPM, implanted 09/04/16, Dr. Lovena Le   Past Medical History:  Diagnosis Date  . Complete heart block ([place])  a. s/p MDT dual chamber His Bundle pacemaker - Dr Lovena Le  . Dementia    "maybe a little; never told us exactly" (09/03/2016)  . Hypothyroidism   . Skin cancer of face     Past Surgical History:  Procedure Laterality Date  . APPENDECTOMY    . CATARACT EXTRACTION Left    "tried to put lens in but couldn't"  . CATARACT EXTRACTION W/ INTRAOCULAR LENS IMPLANT Right   . PACEMAKER IMPLANT N/A 09/04/2016   MDT dual chamber PPM with His Bundle pacing lead implanted by Dr Lovena Le for complete heart block    . PARS PLANA VITRECTOMY N/A 03/01/2016   Procedure: PARS PLANA VITRECTOMY 25 GAUGE FOR ENDOPHTHALMITIS;  Surgeon: Hayden Pedro, MD;  Location: [facility];  Service: Ophthalmology;  Laterality: N/A;  . SKIN CANCER EXCISION Left    face    Current Outpatient Medications  Medication Sig Dispense Refill  . Omega-3 Fatty Acids (OMEGA-3 FISH OIL PO) Take by mouth daily.    . Vit B6-Vit B12-Omega 3 Acids (VITAMIN B PLUS+ PO) Take by mouth daily.    Marland Kitchen aspirin 81 MG EC tablet Take 81 mg by mouth daily.    . dorzolamide (TRUSOPT) 2 % ophthalmic solution Place 1 drop into the left eye 3 (three) times daily. (Patient not taking: Reported on 11/05/2017) 10 mL 12  . latanoprost (XALATAN) 0.005 % ophthalmic solution Place 1 drop into the right eye at bedtime.    Marland Kitchen levothyroxine (SYNTHROID, LEVOTHROID) 88 MCG tablet Take 88 mcg by mouth daily.  10   No current facility-administered medications for this visit.     Allergies:   Patient has no known allergies.   Social History:  The patient  reports that he has quit smoking. His smoking use included cigarettes. He has a 5.00 pack-year smoking history. He has never used smokeless tobacco. He reports that he does not use drugs.   Family History:  The patient's family history includes Cancer - Other in his sister.  ROS:  Please see the history of present illness.   All other systems are reviewed and otherwise negative.   PHYSICAL EXAM:  VS:  BP 138/72   Pulse 63   Ht 5\' 6"  (1.676 m)   Wt 164 lb (74.4 kg)   BMI 26.47 kg/m  BMI: Body mass index is 26.47 kg/m. Well nourished, well developed, in no acute distress  HEENT: normocephalic, atraumatic  Neck: no JVD, carotid bruits or masses Cardiac:   RRR; no significant murmurs, no rubs, or gallops Lungs:  CTA b/l, no wheezing, rhonchi or rales  Abd: soft, nontender MS: no deformity, age appropriate atrophy Ext: trace edema  Skin: warm and dry, no rash Neuro:  No gross deficits appreciated Psych:  euthymic mood, full affect  PPM site is stable, no tethering or discomfort   EKG:  Not done today PPM interrogation done today and reviewed by myself: battery and lead measurements are good.  99.9% VP, 09/03/17 ahd AFi/flutter epeisode duration 2:73min, 09/07/17 NSVT 2 seconds  09/04/16: TTE Study Conclusions - Left ventricle: The cavity size was normal. There was mild   concentric hypertrophy. Systolic function was normal. The   estimated ejection fraction was in the range of 60% to 65%. Wall   motion was normal; there were no regional wall motion   abnormalities. Doppler parameters are consistent with abnormal   left ventricular relaxation (grade 1 diastolic dysfunction).   Doppler parameters are consistent with elevated ventricular   end-diastolic filling  pressure. - Aortic valve: Moderately thickened, moderately calcified   leaflets. Valve mobility was restricted. There was mild stenosis.   There was moderate regurgitation. Mean gradient (S): 9 mm Hg.   Valve area (VTI): 1.15 cm^2. Valve area (Vmax): 1.96 cm^2. Valve   area (Vmean): 1.24 cm^2. - Aortic root: The aortic root was normal in size. - Mitral valve: Calcified annulus. Mildly thickened leaflets .   There was mild regurgitation. - Left atrium: The atrium was normal in size. - Right ventricle: The cavity size was mildly dilated. Wall   thickness was normal. Systolic function was normal. - Right atrium: The atrium was normal in size. - Tricuspid valve: There was moderate regurgitation. - Pulmonary arteries: Systolic pressure was moderately increased.   PA peak pressure: 56 mm Hg (S). - Inferior vena cava: The vessel was normal in size. - Pericardium, extracardiac: There was no pericardial effusion.  Recent Labs: No results found for requested labs within last 8760 hours.  No results found for requested labs within last 8760 hours.   CrCl cannot be calculated (Patient's most recent lab result is older than the maximum 21  days allowed.).   Wt Readings from Last 3 Encounters:  11/05/17 164 lb (74.4 kg)  12/31/16 164 lb 12.8 oz (74.8 kg)  09/05/16 163 lb (73.9 kg)     Other studies reviewed: Additional studies/records reviewed today include: summarized above  ASSESSMENT AND PLAN:  1. PPM     Intact function, no changes made  2. HTN     Looks OK  3. B/l LE weakness, falls     On no BP meds, doubt a cardiac etiology     Intact pacer function     Pending neurology f/u  4. PAF     One episode, 2 minutes     Given falls [facility] with cardiology at [facility], felt increased risk for full a/c     Discussed this today with patient and sone-in-law, will monitor burden    Disposition: F/u with device check visit in 26mo, sooner if needed.  deviec clinic staff is working on getting a cell adapter for his home transmitter unit.  Until them will keep Q 71mo in-clinic visits at least  Current medicines are reviewed at length with the patient today.  The patient did not have any concerns regarding medicines.  Venetia Night, PA-C 11/05/2017 3:58 PM     [place] [address] [phone] (office)  [phone] (fax)

## 2017-11-05 NOTE — Patient Instructions (Signed)
Medication Instructions:   Your physician recommends that you continue on your current medications as directed. Please refer to the Current Medication list given to you today.   If you need a refill on your cardiac medications before your next appointment, please call your pharmacy.  Labwork: NONE ORDERED  TODAY    Testing/Procedures: NONE ORDERED  TODAY    Follow-Up: Your physician wants you to follow-up in:  IN  6  MONTHS WITH URSUY You will receive a reminder letter in the mail two months in advance. If you don't receive a letter, please call our office to schedule the follow-up appointment.      Any Other Special Instructions Will Be Listed Below (If Applicable).                                                                                                                                                   

## 2018-09-10 ENCOUNTER — Encounter: Payer: Medicare Other | Admitting: *Deleted

## 2018-09-10 ENCOUNTER — Other Ambulatory Visit: Payer: Self-pay

## 2018-09-21 ENCOUNTER — Encounter: Payer: Self-pay | Admitting: Cardiology

## 2019-06-09 ENCOUNTER — Telehealth: Payer: Self-pay

## 2019-06-09 NOTE — Telephone Encounter (Signed)
Left message for patient to remind of missed remote transmission.  

## 2020-09-18 DIAGNOSIS — R296 Repeated falls: Secondary | ICD-10-CM | POA: Insufficient documentation

## 2020-09-25 ENCOUNTER — Emergency Department (HOSPITAL_COMMUNITY): Payer: Medicare Other

## 2020-09-25 ENCOUNTER — Inpatient Hospital Stay (HOSPITAL_COMMUNITY)
Admission: EM | Admit: 2020-09-25 | Discharge: 2020-09-30 | DRG: 563 | Disposition: A | Discharge status: Skilled Nursing Facility | Payer: Medicare Other | Source: Skilled Nursing Facility | Attending: Internal Medicine | Admitting: Internal Medicine

## 2020-09-25 ENCOUNTER — Encounter (HOSPITAL_COMMUNITY): Payer: Self-pay

## 2020-09-25 DIAGNOSIS — Z85828 Personal history of other malignant neoplasm of skin: Secondary | ICD-10-CM

## 2020-09-25 DIAGNOSIS — K8020 Calculus of gallbladder without cholecystitis without obstruction: Secondary | ICD-10-CM | POA: Diagnosis present

## 2020-09-25 DIAGNOSIS — N179 Acute kidney failure, unspecified: Secondary | ICD-10-CM | POA: Diagnosis not present

## 2020-09-25 DIAGNOSIS — Z9181 History of falling: Secondary | ICD-10-CM

## 2020-09-25 DIAGNOSIS — J90 Pleural effusion, not elsewhere classified: Secondary | ICD-10-CM | POA: Diagnosis present

## 2020-09-25 DIAGNOSIS — K449 Diaphragmatic hernia without obstruction or gangrene: Secondary | ICD-10-CM | POA: Diagnosis not present

## 2020-09-25 DIAGNOSIS — M4854XA Collapsed vertebra, not elsewhere classified, thoracic region, initial encounter for fracture: Secondary | ICD-10-CM | POA: Diagnosis present

## 2020-09-25 DIAGNOSIS — J9811 Atelectasis: Secondary | ICD-10-CM | POA: Diagnosis present

## 2020-09-25 DIAGNOSIS — N1831 Chronic kidney disease, stage 3a: Secondary | ICD-10-CM | POA: Diagnosis present

## 2020-09-25 DIAGNOSIS — S51811A Laceration without foreign body of right forearm, initial encounter: Secondary | ICD-10-CM | POA: Diagnosis present

## 2020-09-25 DIAGNOSIS — D509 Iron deficiency anemia, unspecified: Secondary | ICD-10-CM | POA: Diagnosis present

## 2020-09-25 DIAGNOSIS — Z66 Do not resuscitate: Secondary | ICD-10-CM | POA: Diagnosis not present

## 2020-09-25 DIAGNOSIS — H409 Unspecified glaucoma: Secondary | ICD-10-CM | POA: Diagnosis present

## 2020-09-25 DIAGNOSIS — Z87891 Personal history of nicotine dependence: Secondary | ICD-10-CM | POA: Diagnosis not present

## 2020-09-25 DIAGNOSIS — Z79890 Hormone replacement therapy (postmenopausal): Secondary | ICD-10-CM | POA: Diagnosis not present

## 2020-09-25 DIAGNOSIS — I442 Atrioventricular block, complete: Secondary | ICD-10-CM | POA: Diagnosis present

## 2020-09-25 DIAGNOSIS — M24411 Recurrent dislocation, right shoulder: Secondary | ICD-10-CM | POA: Diagnosis present

## 2020-09-25 DIAGNOSIS — S43014A Anterior dislocation of right humerus, initial encounter: Principal | ICD-10-CM | POA: Diagnosis present

## 2020-09-25 DIAGNOSIS — E8809 Other disorders of plasma-protein metabolism, not elsewhere classified: Secondary | ICD-10-CM

## 2020-09-25 DIAGNOSIS — N189 Chronic kidney disease, unspecified: Secondary | ICD-10-CM

## 2020-09-25 DIAGNOSIS — D539 Nutritional anemia, unspecified: Secondary | ICD-10-CM

## 2020-09-25 DIAGNOSIS — Z950 Presence of cardiac pacemaker: Secondary | ICD-10-CM | POA: Diagnosis not present

## 2020-09-25 DIAGNOSIS — M25511 Pain in right shoulder: Secondary | ICD-10-CM | POA: Diagnosis present

## 2020-09-25 DIAGNOSIS — Y92129 Unspecified place in nursing home as the place of occurrence of the external cause: Secondary | ICD-10-CM

## 2020-09-25 DIAGNOSIS — F0390 Unspecified dementia without behavioral disturbance: Secondary | ICD-10-CM | POA: Diagnosis present

## 2020-09-25 DIAGNOSIS — E860 Dehydration: Secondary | ICD-10-CM | POA: Diagnosis present

## 2020-09-25 DIAGNOSIS — E039 Hypothyroidism, unspecified: Secondary | ICD-10-CM

## 2020-09-25 DIAGNOSIS — Z20822 Contact with and (suspected) exposure to covid-19: Secondary | ICD-10-CM | POA: Diagnosis present

## 2020-09-25 DIAGNOSIS — W19XXXA Unspecified fall, initial encounter: Secondary | ICD-10-CM | POA: Diagnosis present

## 2020-09-25 DIAGNOSIS — Z79899 Other long term (current) drug therapy: Secondary | ICD-10-CM

## 2020-09-25 DIAGNOSIS — S43004D Unspecified dislocation of right shoulder joint, subsequent encounter: Secondary | ICD-10-CM

## 2020-09-25 LAB — COMPREHENSIVE METABOLIC PANEL
ALT: 34 U/L (ref 0–44)
AST: 48 U/L — ABNORMAL HIGH (ref 15–41)
Albumin: 2.8 g/dL — ABNORMAL LOW (ref 3.5–5.0)
Alkaline Phosphatase: 59 U/L (ref 38–126)
Anion gap: 7 (ref 5–15)
BUN: 38 mg/dL — ABNORMAL HIGH (ref 8–23)
CO2: 23 mmol/L (ref 22–32)
Calcium: 8.4 mg/dL — ABNORMAL LOW (ref 8.9–10.3)
Chloride: 107 mmol/L (ref 98–111)
Creatinine, Ser: 1.35 mg/dL — ABNORMAL HIGH (ref 0.61–1.24)
GFR, Estimated: 49 mL/min — ABNORMAL LOW (ref >60)
Glucose, Bld: 122 mg/dL — ABNORMAL HIGH (ref 70–99)
Potassium: 3.6 mmol/L (ref 3.5–5.1)
Sodium: 137 mmol/L (ref 135–145)
Total Bilirubin: 1.7 mg/dL — ABNORMAL HIGH (ref 0.3–1.2)
Total Protein: 5.6 g/dL — ABNORMAL LOW (ref 6.5–8.1)

## 2020-09-25 LAB — CBC WITH DIFFERENTIAL/PLATELET
Abs Immature Granulocytes: 0.17 10*3/uL — ABNORMAL HIGH (ref 0.00–0.07)
Basophils Absolute: 0.1 10*3/uL (ref 0.0–0.1)
Basophils Relative: 1 %
Eosinophils Absolute: 0.3 10*3/uL (ref 0.0–0.5)
Eosinophils Relative: 3 %
HCT: 29.1 % — ABNORMAL LOW (ref 39.0–52.0)
Hemoglobin: 9.4 g/dL — ABNORMAL LOW (ref 13.0–17.0)
Immature Granulocytes: 2 %
Lymphocytes Relative: 20 %
Lymphs Abs: 2 10*3/uL (ref 0.7–4.0)
MCH: 32.5 pg (ref 26.0–34.0)
MCHC: 32.3 g/dL (ref 30.0–36.0)
MCV: 100.7 fL — ABNORMAL HIGH (ref 80.0–100.0)
Monocytes Absolute: 0.8 10*3/uL (ref 0.1–1.0)
Monocytes Relative: 8 %
Neutro Abs: 6.5 10*3/uL (ref 1.7–7.7)
Neutrophils Relative %: 66 %
Platelets: 236 10*3/uL (ref 150–400)
RBC: 2.89 MIL/uL — ABNORMAL LOW (ref 4.22–5.81)
RDW: 18 % — ABNORMAL HIGH (ref 11.5–15.5)
WBC: 9.8 10*3/uL (ref 4.0–10.5)
nRBC: 0.2 % (ref 0.0–0.2)

## 2020-09-25 MED ORDER — PROPOFOL 10 MG/ML IV BOLUS
20.0000 mg | Freq: Once | INTRAVENOUS | Status: AC
  Administered 2020-09-25: 20 mg via INTRAVENOUS

## 2020-09-25 MED ORDER — IOHEXOL 300 MG/ML  SOLN
100.0000 mL | Freq: Once | INTRAMUSCULAR | Status: AC | PRN
  Administered 2020-09-25: 100 mL via INTRAVENOUS

## 2020-09-25 MED ORDER — PROPOFOL 10 MG/ML IV BOLUS
0.5000 mg/kg | Freq: Once | INTRAVENOUS | Status: AC
  Administered 2020-09-25: 20 mg via INTRAVENOUS
  Filled 2020-09-25: qty 20

## 2020-09-25 NOTE — H&P (Addendum)
History and Physical  Shakir Petrosino UXN:[id number] DOB: [birth year] DOA: 09/25/2020  Referring physician: Layla Maw PCP: Waldon Reining, NP  Patient coming from: [facility] rehab facility  Chief Complaint: Shoulder injury, bleeding and bruising  HPI: Tracy Castillo is a 87 y.o. male with medical history significant for complete heart block with pacemaker, dementia, hypothyroidism, glaucoma who presented to the emergency department via EMS from [facility] rehab facility due to right shoulder dislocation.  Patient was unable to provide history possibly due to underlying dementia, history was obtained from ED PA and ED medical record.  Per report, patient was reported to be admitted to the facility on 4/21 due to right shoulder dislocation and subsequent reduction.  Patient was reported to be discharged from this facility because patient was not keeping the sling on his right arm.  Patient was reported to have falling about 2 days ago and he was complaining of right shoulder right arm right flank and left hip pain.  Patient denies loss of breath, chest pain, abdominal pain at bedside.  ED Course: In the emergency department, temperature was 97.72F, this improved to 98.54F.  Work-up in the ED showed microcytic anemia, BUN/creatinine 38/1.35 (creatinine is within baseline range).  Albumin 2.8, AST 48, T bili 1.7.   Right shoulder x-ray showed persistent anteroinferior right shoulder dislocation  CT of chest showed complete anterior dislocation of the right humeral head with respect to the glenoid and Acute appearing nondisplaced fractures of the right twelfth rib and left seventh rib.  Small bilateral pleural effusions with basilar atelectasis noted. Reduction of the shoulder was attempted by the ED team without success Dr. Amedeo Kinsman (orthopedic surgeon) was consulted and will see patient in the morning.  Hospitalist was asked to admit patient for further evaluation and management  Review  of Systems: This cannot be obtained at this time due to patient's underlying dementia  Past Medical History:  Diagnosis Date  . Complete heart block (HCC)    a. s/p MDT dual chamber His Bundle pacemaker - Dr Lovena Le  . Dementia ([place])    "maybe a little; never told us exactly" (09/03/2016)  . Hypothyroidism   . Skin cancer of face    Past Surgical History:  Procedure Laterality Date  . APPENDECTOMY    . CATARACT EXTRACTION Left    "tried to put lens in but couldn't"  . CATARACT EXTRACTION W/ INTRAOCULAR LENS IMPLANT Right   . PACEMAKER IMPLANT N/A 09/04/2016   MDT dual chamber PPM with His Bundle pacing lead implanted by Dr Lovena Le for complete heart block  . PARS PLANA VITRECTOMY N/A 03/01/2016   Procedure: PARS PLANA VITRECTOMY 25 GAUGE FOR ENDOPHTHALMITIS;  Surgeon: Hayden Pedro, MD;  Location: [place];  Service: Ophthalmology;  Laterality: N/A;  . SKIN CANCER EXCISION Left    face    Social History:  reports that he has quit smoking. His smoking use included cigarettes. He has a 5.00 pack-year smoking history. He has never used smokeless tobacco. He reports that he does not use drugs. No history on file for alcohol use.   No Known Allergies  Family History  Problem Relation Age of Onset  . Cancer - Other Sister        breast     Prior to Admission medications   Medication Sig Start Date End Date Taking? Authorizing Provider  acetaminophen (TYLENOL) 325 MG tablet Take 650 mg by mouth every 6 (six) hours as needed for mild pain.   Yes  [provider]  acetaminophen (TYLENOL) 500 MG tablet Take 500 mg by mouth every 6 (six) hours as needed.   Yes [provider]  ferrous sulfate 325 (65 FE) MG tablet Take 1 tablet by mouth daily. 09/22/20 09/22/21 Yes [provider]  levothyroxine (SYNTHROID) 100 MCG tablet Take 100 mcg by mouth daily. 07/19/20  Yes [provider]  QUEtiapine (SEROQUEL) 25 MG tablet Take 25 mg by mouth at bedtime. 06/15/20  Yes  [provider]  senna (SENOKOT) 8.6 MG TABS tablet Take 1 tablet by mouth daily as needed for mild constipation.   Yes [provider]  aspirin 81 MG EC tablet Take 81 mg by mouth daily. Patient not taking: Reported on 09/25/2020    [provider]  donepezil (ARICEPT) 5 MG tablet Take 5 mg by mouth at bedtime. Patient not taking: Reported on 09/25/2020 06/06/20   [provider]  dorzolamide (TRUSOPT) 2 % ophthalmic solution Place 1 drop into the left eye 3 (three) times daily. Patient not taking: No sig reported 03/02/16   Hayden Pedro, MD  latanoprost (XALATAN) 0.005 % ophthalmic solution Place 1 drop into the right eye at bedtime. Patient not taking: Reported on 09/25/2020 02/26/16   [provider]  levothyroxine (SYNTHROID, LEVOTHROID) 88 MCG tablet Take 88 mcg by mouth daily. Patient not taking: Reported on 09/25/2020 08/13/16   [provider]  Omega-3 Fatty Acids (OMEGA-3 FISH OIL PO) Take by mouth daily. Patient not taking: Reported on 09/25/2020    [provider]  Vit B6-Vit B12-Omega 3 Acids (VITAMIN B PLUS+ PO) Take by mouth daily. Patient not taking: Reported on 09/25/2020    [provider]    Physical Exam: BP (!) 145/60   Pulse 84   Temp 98.1 F (36.7 C) (Axillary)   Resp (!) 23   Ht 5\' 6"  (1.676 m)   Wt 64.9 kg   SpO2 99%   BMI 23.09 kg/m   . General: 87 y.o. year-old male well developed well nourished in no acute distress.  Alert and oriented x3.  Marland Kitchen HEENT:  EOMI, right parietal scalp laceration with clean base and healing secured with Steri-Strips noted. . Neck: Supple, trachea medial . Cardiovascular: Regular rate and rhythm with no rubs or gallops.  No thyromegaly or JVD noted.  No lower extremity edema. 2/4 pulses in all 4 extremities. Marland Kitchen Respiratory: Clear to auscultation with no wheezes or rales. Good inspiratory effort. . Abdomen: Soft nontender nondistended with normal bowel sounds x4  quadrants. . Muskuloskeletal: Tender to palpation of right lateral chest area with some bruising noted.  Bruising and swelling of right shoulder and right upper extremity noted . Neuro: Patient alert and oriented to person only. sensation intact  . Skin: No ulcerative lesions noted or rashes . Psychiatry: Mood is appropriate for condition and setting          Labs on Admission:  Basic Metabolic Panel: Recent Labs  Lab 09/25/20 1528  NA 137  K 3.6  CL 107  CO2 23  GLUCOSE 122*  BUN 38*  CREATININE 1.35*  CALCIUM 8.4*   Liver Function Tests: Recent Labs  Lab 09/25/20 1528  AST 48*  ALT 34  ALKPHOS 59  BILITOT 1.7*  PROT 5.6*  ALBUMIN 2.8*   No results for input(s): LIPASE, AMYLASE in the last 168 hours. No results for input(s): AMMONIA in the last 168 hours. CBC: Recent Labs  Lab 09/25/20 1528  WBC 9.8  NEUTROABS 6.5  HGB 9.4*  HCT 29.1*  MCV 100.7*  PLT 236   Cardiac Enzymes: No results for input(s): CKTOTAL, CKMB, CKMBINDEX, TROPONINI in the last 168 hours.  BNP (last 3 results) No results for input(s): BNP in the last 8760 hours.  ProBNP (last 3 results) No results for input(s): PROBNP in the last 8760 hours.  CBG: No results for input(s): GLUCAP in the last 168 hours.  Radiological Exams on Admission: DG Chest 1 View  Result Date: 09/25/2020 CLINICAL DATA:  Fall with shoulder pain EXAM: CHEST  1 VIEW COMPARISON:  September 21, 2020 FINDINGS: Two lead left chest pacemaker with leads overlying the right atrium and right ventricle. Heart size is upper limits of normal. Moderate size hiatal hernia. No focal consolidation. No significant pleural effusion or visible pneumothorax. Aortic atherosclerosis. Anterior dislocation of the right humeral head at the glenoid. IMPRESSION: 1. Anterior dislocation of the right humeral head at the glenoid. 2. No acute cardiopulmonary abnormality. Electronically Signed   By: Dahlia Bailiff MD   On: 09/25/2020 19:12   DG  Shoulder Right  Result Date: 09/25/2020 CLINICAL DATA:  Right shoulder dislocation, attempted reduction EXAM: RIGHT SHOULDER - 2+ VIEW COMPARISON:  None. FINDINGS: Single-view, frontal radiograph of the right shoulder demonstrates persistent anteroinferior right glenohumeral dislocation. No definite superimposed fracture. Acromioclavicular joint space appears preserved. There is diffuse subcutaneous edema within the soft tissues of the right shoulder and visualized right upper extremity. Limited evaluation of the right hemithorax is unremarkable. IMPRESSION: Persistent anteroinferior right shoulder dislocation. Electronically Signed   By: Fidela Salisbury MD   On: 09/25/2020 23:37   DG Shoulder Right  Result Date: 09/25/2020 CLINICAL DATA:  Fall with pain. EXAM: RIGHT SHOULDER - 2+ VIEW COMPARISON:  September 18, 2020. FINDINGS: Anterior dislocation of the humeral head at the glenoid. Soft tissues are unremarkable. IMPRESSION: Anterior dislocation of the humeral head. Electronically Signed   By: Dahlia Bailiff MD   On: 09/25/2020 19:08   DG Forearm Right  Result Date: 09/25/2020 CLINICAL DATA:  Fall with shoulder pain EXAM: RIGHT FOREARM - 2 VIEW COMPARISON:  September 18, 2020. FINDINGS: There is no evidence of fracture or other focal bone lesions. Soft tissues are unremarkable. IMPRESSION: Negative. Electronically Signed   By: Dahlia Bailiff MD   On: 09/25/2020 19:04   CT Head Wo Contrast  Result Date: 09/25/2020 CLINICAL DATA:  Un witnessed fall on 04/21, head laceration EXAM: CT HEAD WITHOUT CONTRAST CT CERVICAL SPINE WITHOUT CONTRAST TECHNIQUE: Multidetector CT imaging of the head and cervical spine was performed following the standard protocol without intravenous contrast. Multiplanar CT image reconstructions of the cervical spine were also generated. COMPARISON:  09/21/2020 FINDINGS: CT HEAD FINDINGS Brain: No evidence of acute large vascular territory infarction, hemorrhage, hydrocephalus, extra-axial  collection or mass lesion/mass effect. Stable global parenchymal volume loss and ischemic change. Vascular: No hyperdense vessel. Atherosclerotic calcifications of the internal carotid arteries and vertebral arteries at the skull base. Skull: Normal. Negative for fracture or focal lesion. Sinuses/Orbits: The paranasal sinuses and mastoid air cells are predominantly clear. Prior lens surgery. Other: None CT CERVICAL SPINE FINDINGS Alignment: Normal.  No evidence of traumatic listhesis. Skull base and vertebrae: No acute fracture. No primary bone lesion or focal pathologic process. Soft tissues and spinal canal: No prevertebral fluid or swelling. No visible canal hematoma. Disc levels: Stable multilevel degenerative change of the spine including disc space narrowing, uncovertebral and facet hypertrophy as well as osteophytosis most significant in the lower cervical spine at  C5-C6 and C6-C7. Upper chest: Negative. Other: None IMPRESSION: Motion degraded examination, within this context: 1. No evidence of acute intracranial pathology. 2. Stable chronic atrophic and ischemic changes compared to prior examination. 3. No evidence of acute fracture or traumatic listhesis of the cervical spine. 4. Stable multilevel degenerative change of the cervical spine. Electronically Signed   By: Dahlia Bailiff MD   On: 09/25/2020 17:02   CT Chest W Contrast  Result Date: 09/25/2020 CLINICAL DATA:  Unwitnessed fall with bruising to the right side of the body and various stages of healing. EXAM: CT CHEST, ABDOMEN, AND PELVIS WITH CONTRAST TECHNIQUE: Multidetector CT imaging of the chest, abdomen and pelvis was performed following the standard protocol during bolus administration of intravenous contrast. CONTRAST:  14mL OMNIPAQUE IOHEXOL 300 MG/ML  SOLN COMPARISON:  09/18/2020 FINDINGS: CT CHEST FINDINGS Cardiovascular: Normal heart size. No pericardial effusions. Calcification in the aorta, coronary arteries, and mitral valve  annulus. Cardiac pacemaker. Normal caliber thoracic aorta with diffuse calcification. Mediastinum/Nodes: No significant lymphadenopathy. Moderate esophageal hiatal hernia. Esophagus is decompressed. Lungs/Pleura: Small bilateral pleural effusions with basilar atelectasis. Peripheral interstitial pattern to the lungs may represent early edema. No focal consolidation. No pneumothorax. Musculoskeletal: Complete anterior dislocation of the right humeral head with respect to the glenoid, new since prior study. Large right shoulder effusion with edema in the visualized right upper arm. Edema and infiltration in the subcutaneous fat of the right axilla and right chest wall. Acute appearing nondisplaced fractures of the right twelfth rib and left seventh rib. Multiple thoracic vertebral compression deformities at T1, T2, and T4 levels are unchanged since prior study. The sternum appears intact. CT ABDOMEN PELVIS FINDINGS Hepatobiliary: Cholelithiasis without evidence of cholecystitis. No bile duct dilatation. No focal liver lesions. Pancreas: Unremarkable. No pancreatic ductal dilatation or surrounding inflammatory changes. Spleen: No splenic injury or perisplenic hematoma. Adrenals/Urinary Tract: No adrenal hemorrhage or renal injury identified. Bladder is unremarkable. Stomach/Bowel: Stomach, small bowel, and colon are not abnormally distended. Stool fills the colon. Residual contrast material in the rectosigmoid region. Vascular/Lymphatic: Prominent aortic calcification. No aneurysm. No significant lymphadenopathy. Reproductive: Prostate is unremarkable. Other: No free air or free fluid in the abdomen. Abdominal wall musculature appears intact. Mild edema in the subcutaneous fat along the right flank region, likely contusion. Musculoskeletal: Normal alignment of the lumbar spine. Diffuse degenerative change. No vertebral compression deformities. The sacrum, pelvis, and hips appear intact. IMPRESSION: 1. Complete anterior  dislocation of the right humeral head with respect to the glenoid, new since prior study. Large right shoulder effusion with edema in the visualized right upper arm. Edema and infiltration in the subcutaneous fat of the right axilla and right chest wall. 2. Acute appearing nondisplaced fractures of the right twelfth rib and left seventh rib. 3. Multiple thoracic vertebral compression deformities are unchanged since prior study. 4. Small bilateral pleural effusions with basilar atelectasis. Peripheral interstitial pattern to the lungs may represent early edema. 5. Moderate esophageal hiatal hernia. 6. Cholelithiasis without evidence of cholecystitis. 7. Mild edema in the subcutaneous fat along the right flank region, likely contusion. 8. Aortic atherosclerosis. Aortic Atherosclerosis (ICD10-I70.0). Electronically Signed   By: Lucienne Capers M.D.   On: 09/25/2020 20:47   CT Cervical Spine Wo Contrast  Result Date: 09/25/2020 CLINICAL DATA:  Un witnessed fall on 04/21, head laceration EXAM: CT HEAD WITHOUT CONTRAST CT CERVICAL SPINE WITHOUT CONTRAST TECHNIQUE: Multidetector CT imaging of the head and cervical spine was performed following the standard protocol without intravenous contrast. Multiplanar CT  image reconstructions of the cervical spine were also generated. COMPARISON:  09/21/2020 FINDINGS: CT HEAD FINDINGS Brain: No evidence of acute large vascular territory infarction, hemorrhage, hydrocephalus, extra-axial collection or mass lesion/mass effect. Stable global parenchymal volume loss and ischemic change. Vascular: No hyperdense vessel. Atherosclerotic calcifications of the internal carotid arteries and vertebral arteries at the skull base. Skull: Normal. Negative for fracture or focal lesion. Sinuses/Orbits: The paranasal sinuses and mastoid air cells are predominantly clear. Prior lens surgery. Other: None CT CERVICAL SPINE FINDINGS Alignment: Normal.  No evidence of traumatic listhesis. Skull base  and vertebrae: No acute fracture. No primary bone lesion or focal pathologic process. Soft tissues and spinal canal: No prevertebral fluid or swelling. No visible canal hematoma. Disc levels: Stable multilevel degenerative change of the spine including disc space narrowing, uncovertebral and facet hypertrophy as well as osteophytosis most significant in the lower cervical spine at C5-C6 and C6-C7. Upper chest: Negative. Other: None IMPRESSION: Motion degraded examination, within this context: 1. No evidence of acute intracranial pathology. 2. Stable chronic atrophic and ischemic changes compared to prior examination. 3. No evidence of acute fracture or traumatic listhesis of the cervical spine. 4. Stable multilevel degenerative change of the cervical spine. Electronically Signed   By: Dahlia Bailiff MD   On: 09/25/2020 17:02   CT ABDOMEN PELVIS W CONTRAST  Result Date: 09/25/2020 CLINICAL DATA:  Unwitnessed fall with bruising to the right side of the body and various stages of healing. EXAM: CT CHEST, ABDOMEN, AND PELVIS WITH CONTRAST TECHNIQUE: Multidetector CT imaging of the chest, abdomen and pelvis was performed following the standard protocol during bolus administration of intravenous contrast. CONTRAST:  161mL OMNIPAQUE IOHEXOL 300 MG/ML  SOLN COMPARISON:  09/18/2020 FINDINGS: CT CHEST FINDINGS Cardiovascular: Normal heart size. No pericardial effusions. Calcification in the aorta, coronary arteries, and mitral valve annulus. Cardiac pacemaker. Normal caliber thoracic aorta with diffuse calcification. Mediastinum/Nodes: No significant lymphadenopathy. Moderate esophageal hiatal hernia. Esophagus is decompressed. Lungs/Pleura: Small bilateral pleural effusions with basilar atelectasis. Peripheral interstitial pattern to the lungs may represent early edema. No focal consolidation. No pneumothorax. Musculoskeletal: Complete anterior dislocation of the right humeral head with respect to the glenoid, new since  prior study. Large right shoulder effusion with edema in the visualized right upper arm. Edema and infiltration in the subcutaneous fat of the right axilla and right chest wall. Acute appearing nondisplaced fractures of the right twelfth rib and left seventh rib. Multiple thoracic vertebral compression deformities at T1, T2, and T4 levels are unchanged since prior study. The sternum appears intact. CT ABDOMEN PELVIS FINDINGS Hepatobiliary: Cholelithiasis without evidence of cholecystitis. No bile duct dilatation. No focal liver lesions. Pancreas: Unremarkable. No pancreatic ductal dilatation or surrounding inflammatory changes. Spleen: No splenic injury or perisplenic hematoma. Adrenals/Urinary Tract: No adrenal hemorrhage or renal injury identified. Bladder is unremarkable. Stomach/Bowel: Stomach, small bowel, and colon are not abnormally distended. Stool fills the colon. Residual contrast material in the rectosigmoid region. Vascular/Lymphatic: Prominent aortic calcification. No aneurysm. No significant lymphadenopathy. Reproductive: Prostate is unremarkable. Other: No free air or free fluid in the abdomen. Abdominal wall musculature appears intact. Mild edema in the subcutaneous fat along the right flank region, likely contusion. Musculoskeletal: Normal alignment of the lumbar spine. Diffuse degenerative change. No vertebral compression deformities. The sacrum, pelvis, and hips appear intact. IMPRESSION: 1. Complete anterior dislocation of the right humeral head with respect to the glenoid, new since prior study. Large right shoulder effusion with edema in the visualized right upper arm. Edema  and infiltration in the subcutaneous fat of the right axilla and right chest wall. 2. Acute appearing nondisplaced fractures of the right twelfth rib and left seventh rib. 3. Multiple thoracic vertebral compression deformities are unchanged since prior study. 4. Small bilateral pleural effusions with basilar atelectasis.  Peripheral interstitial pattern to the lungs may represent early edema. 5. Moderate esophageal hiatal hernia. 6. Cholelithiasis without evidence of cholecystitis. 7. Mild edema in the subcutaneous fat along the right flank region, likely contusion. 8. Aortic atherosclerosis. Aortic Atherosclerosis (ICD10-I70.0). Electronically Signed   By: Lucienne Capers M.D.   On: 09/25/2020 20:47   DG Humerus Right  Result Date: 09/25/2020 CLINICAL DATA:  Fall with pain EXAM: RIGHT HUMERUS - 2+ VIEW COMPARISON:  September 18, 2020 FINDINGS: There is no evidence of humerus fracture. Anterior dislocation of the humeral head at the glenoid soft tissues are unremarkable. IMPRESSION: Anterior shoulder dislocation. No fracture of the humerus visualized. Electronically Signed   By: Dahlia Bailiff MD   On: 09/25/2020 19:09   DG Hand Complete Right  Result Date: 09/25/2020 CLINICAL DATA:  Fall with pain EXAM: RIGHT HAND - COMPLETE 3+ VIEW COMPARISON:  None. FINDINGS: There is no evidence of fracture or dislocation. Degenerative change of the interphalangeal joints as well as the first MTP and CPC joints. Soft tissues are unremarkable. IMPRESSION: No fracture or dislocation of the right hand. Degenerative changes as above. Electronically Signed   By: Dahlia Bailiff MD   On: 09/25/2020 19:14   DG Hip Unilat W or Wo Pelvis 2-3 Views Left  Result Date: 09/25/2020 CLINICAL DATA:  Fall with pain EXAM: DG HIP (WITH OR WITHOUT PELVIS) 2-3V LEFT COMPARISON:  September 18, 2020 FINDINGS: There is no evidence of hip fracture or dislocation. There is no evidence of arthropathy or other focal bone abnormality. Radiopaque contrast in the colon. Vascular calcifications. Pelvic phleboliths. IMPRESSION: Negative. Electronically Signed   By: Dahlia Bailiff MD   On: 09/25/2020 19:10    EKG: I independently viewed the EKG done and my findings are as followed: EKG was not done in the ED  Assessment/Plan Present on Admission: . Anterior dislocation  of right shoulder . Complete heart block (HCC)  Active Problems:   Complete heart block (HCC)   Anterior dislocation of right shoulder   Esophageal hiatal hernia   Bilateral pleural effusion   Hypothyroidism   Glaucoma   Macrocytic anemia   Hypoalbuminemia   Chronic kidney disease   Anterior dislocation of right shoulder Continue IV morphine 2 mg every 4 hours as needed Continue fall precaution and neurochecks Attempted reduction by the ED physician was unsuccessful, orthopedic surgery was consulted and will see patient in the morning Continue PT/OT eval and treat  Macrocytic anemia MCV 100.7, H/H 9.4/29.1 Folate and vitamin B12 levels will be checked  Hypoalbuminemia Albumin 2.8, protein supplement to be provided  Chronic kidney disease stage IIIA BUN/creatinine 38/1.35 (creatinine is within baseline range) Renally adjust medications, avoid nephrotoxic agents/dehydration/hypotension  Complete heart block Stable, patient has a pacemaker  Esophageal hiatal hernia Continue Protonix  Bilateral pleural effusion CT of the chest showed Small bilateral pleural effusions with basilar atelectasis. Peripheral interstitial pattern to the lungs may represent early edema.  Continue IV Lasix 20 Mg twice daily  Hypothyroidism Continue Synthroid  Glaucoma Patient was not taking home latanoprost per med rec  DVT prophylaxis: SCD  Code Status: Full code  Family Communication: None at bedside  Disposition Plan:  Patient is from:  home Anticipated DC to:                   SNF or family members home Anticipated DC date:               2-3 days Anticipated DC barriers:           Patient requires inpatient management for reduction of right shoulder dislocation   Consults called: Orthopedic surgery  Admission status: Inpatient    Bernadette Hoit MD [facility]  09/26/2020, 5:13 AM

## 2020-09-25 NOTE — ED Notes (Signed)
Old foam from skin tears to left arm removed as pt was trying to bite off the dressings. New Mepilex applied, wrapped with coban for protection. Pt remains resting in position of comfort.

## 2020-09-25 NOTE — ED Notes (Addendum)
Corene Cornea803 [phone]. Son in Sports [employer]. States at the [facility] 4/21 pt fell again, was sent back to the hospital to have laceration to head repaired. At the SNF they had to replace the Dermabond and steri-strips Friday as he pulled them off. This is the last fall family is aware of.

## 2020-09-25 NOTE — ED Notes (Signed)
Two-nurse witnessed that pt's son-in-law gave verbal consent for conscious sedation for reduction of right shoulder. Charge nurse as secondary witness.

## 2020-09-25 NOTE — ED Notes (Signed)
PT EMS transfer from SNF. They report pt came to them post fall and right shoulder dislocation and reduction on 4/21. They think he may have fallen while at their facility but none was witnessed. Pt will not keep shoulder brace on. They have observed worsening bruising to right ribs/chest and sent him to the ER for right shoulder dislocation. Pt arrives awake, denies pain when directly asked. Significant bruising to right side of body in various stages of healing. No active bleeding. Swelling noted to right arm. Pressure wrap applied to both arms. Pt able to lift left arm but not right arm. Staff do not report any noted LOC but pt does have significant dementia and cannot provide meaningful hx.

## 2020-09-25 NOTE — ED Notes (Signed)
PT taken to CT.

## 2020-09-25 NOTE — ED Notes (Signed)
Pt back from CT. IV intact. Coban placed around IV site for protection. Pt updated on POC; remains confused to where he is.

## 2020-09-25 NOTE — ED Provider Notes (Signed)
[facility] Provider Note   CSN: [id number] Arrival date & time: 09/25/20  1418     History Chief Complaint  Patient presents with  . Shoulder Injury  . Bleeding/Bruising    Tracy Castillo is a 87 y.o. male.  HPI       Level 5 caveat due to dementia.  Tracy Castillo is a 87 y.o. male, with a history of complete heart block with pacemaker, dementia, presenting to the ED via EMS from [facility] rehab facility. Per the paperwork accompanying the patient, patient is being discharged from this facility because he is not keeping the sling on his right arm. EMS reports patient is at the facility for rehabilitation following a right shoulder dislocation and subsequent reduction, admitted to the facility April 21.  He has also possibly sustained unwitnessed falls at this facility.  Patient reports pain to the right shoulder, right arm, right flank, and left hip.  He states he fell 2 days ago.  Denies shortness of breath, abdominal pain, numbness.   Past Medical History:  Diagnosis Date  . Complete heart block (HCC)    a. s/p MDT dual chamber His Bundle pacemaker - Dr Lovena Le  . Dementia ([place])    "maybe a little; never told us exactly" (09/03/2016)  . Hypothyroidism   . Skin cancer of face     Patient Active Problem List   Diagnosis Date Noted  . Complete heart block ([place]) 09/04/2016  . Symptomatic bradycardia 09/03/2016  . Endophthalmitis, left eye 03/01/2016  . Endophthalmitis purulent 03/01/2016    Past Surgical History:  Procedure Laterality Date  . APPENDECTOMY    . CATARACT EXTRACTION Left    "tried to put lens in but couldn't"  . CATARACT EXTRACTION W/ INTRAOCULAR LENS IMPLANT Right   . PACEMAKER IMPLANT N/A 09/04/2016   MDT dual chamber PPM with His Bundle pacing lead implanted by Dr Lovena Le for complete heart block  . PARS PLANA VITRECTOMY N/A 03/01/2016   Procedure: PARS PLANA VITRECTOMY 25 GAUGE FOR ENDOPHTHALMITIS;  Surgeon: Hayden Pedro, MD;  Location: [facility];  Service: Ophthalmology;  Laterality: N/A;  . SKIN CANCER EXCISION Left    face       Family History  Problem Relation Age of Onset  . Cancer - Other Sister        breast    Social History   Tobacco Use  . Smoking status: Former Smoker    Packs/day: 0.50    Years: 10.00    Pack years: 5.00    Types: Cigarettes  . Smokeless tobacco: Never Used  . Tobacco comment: quit smoking "in 1950"  Substance Use Topics  . Drug use: No    Home Medications Prior to Admission medications   Medication Sig Start Date End Date Taking? Authorizing Provider  acetaminophen (TYLENOL) 325 MG tablet Take 650 mg by mouth every 6 (six) hours as needed for mild pain.   Yes [provider]  acetaminophen (TYLENOL) 500 MG tablet Take 500 mg by mouth every 6 (six) hours as needed.   Yes [provider]  ferrous sulfate 325 (65 FE) MG tablet Take 1 tablet by mouth daily. 09/22/20 09/22/21 Yes [provider]  levothyroxine (SYNTHROID) 100 MCG tablet Take 100 mcg by mouth daily. 07/19/20  Yes [provider]  QUEtiapine (SEROQUEL) 25 MG tablet Take 25 mg by mouth at bedtime. 06/15/20  Yes [provider]  senna (SENOKOT) 8.6 MG TABS tablet Take 1 tablet by mouth  daily as needed for mild constipation.   Yes [provider]  aspirin 81 MG EC tablet Take 81 mg by mouth daily. Patient not taking: Reported on 09/25/2020    [provider]  donepezil (ARICEPT) 5 MG tablet Take 5 mg by mouth at bedtime. Patient not taking: Reported on 09/25/2020 06/06/20   [provider]  dorzolamide (TRUSOPT) 2 % ophthalmic solution Place 1 drop into the left eye 3 (three) times daily. Patient not taking: No sig reported 03/02/16   Hayden Pedro, MD  latanoprost (XALATAN) 0.005 % ophthalmic solution Place 1 drop into the right eye at bedtime. Patient not taking: Reported on 09/25/2020 02/26/16   [provider]   levothyroxine (SYNTHROID, LEVOTHROID) 88 MCG tablet Take 88 mcg by mouth daily. Patient not taking: Reported on 09/25/2020 08/13/16   [provider]  Omega-3 Fatty Acids (OMEGA-3 FISH OIL PO) Take by mouth daily. Patient not taking: Reported on 09/25/2020    [provider]  Vit B6-Vit B12-Omega 3 Acids (VITAMIN B PLUS+ PO) Take by mouth daily. Patient not taking: Reported on 09/25/2020    [provider]    Allergies    Patient has no known allergies.  Review of Systems   Review of Systems  Unable to perform ROS: Dementia    Physical Exam Updated Vital Signs BP (!) 152/74 (BP Location: Left Arm)   Pulse 85   Temp 97.7 F (36.5 C) (Oral)   Resp 18   Ht 5\' 6"  (1.676 m)   Wt 72.6 kg   SpO2 97%   BMI 25.82 kg/m   Physical Exam Vitals and nursing note reviewed.  Constitutional:      General: He is not in acute distress.    Appearance: He is well-developed. He is not diaphoretic.  HENT:     Head: Normocephalic and atraumatic.     Comments: The face and head were examined without evidence of acute injuries or abnormalities.  There is a laceration to the right parietal scalp that appears to be clean and healing.  It is secured with Steri-Strips.    Mouth/Throat:     Mouth: Mucous membranes are moist.     Pharynx: Oropharynx is clear.  Eyes:     Conjunctiva/sclera: Conjunctivae normal.  Cardiovascular:     Rate and Rhythm: Normal rate and regular rhythm.     Pulses: Normal pulses.          Radial pulses are 2+ on the right side and 2+ on the left side.       Posterior tibial pulses are 2+ on the right side and 2+ on the left side.     Heart sounds: Normal heart sounds.  Pulmonary:     Effort: Pulmonary effort is normal. No respiratory distress.     Breath sounds: Normal breath sounds.  Chest:     Chest wall: Swelling and tenderness present. No deformity or crepitus.     Comments: Tenderness to the right lateral chest with bruising and some  swelling.  No noted deformity or instability. Abdominal:     Palpations: Abdomen is soft.     Tenderness: There is abdominal tenderness. There is no guarding.       Comments: Swelling, bruising, tenderness to the right flank.  Musculoskeletal:     Cervical back: Neck supple. No tenderness.     Right lower leg: No edema.     Left lower leg: No edema.     Comments: Patient has tenderness,  bruising, and swelling to the right shoulder, right humerus, right forearm, and right hand.  Question of deformity to the right humerus.  Though he has quite a bit of edema to the right upper extremity, his tissues are soft. Bruising seems to be in various stages of healing, though there are areas of bruising that could certainly be acute. Patient also indicates tenderness to the left lateral and anterior hip as well as pain with attempted movement of the left hip. No tenderness, swelling, deformity, instability to the left upper extremity, right hip, right lower extremity, or the rest of the left lower extremity.  No midline spinal tenderness.  No bruising swelling, tenderness, deformity, instability noted to the back.  Skin:    General: Skin is warm and dry.  Neurological:     Mental Status: He is alert.     Comments: Patient is able to answer with his name.  He is unsure of the rest of the orientation questions. Motor function and sensation light touch intact in the left upper extremity and bilateral lower extremities. Sensation to light touch reportedly intact in the right arm, hand, and fingers. Patient is barely able to wiggle the fingers of the right hand and he states it hurts to attempt this.  Psychiatric:        Mood and Affect: Mood and affect normal.        Speech: Speech normal.        Behavior: Behavior normal.           ED Results / Procedures / Treatments   Labs (all labs ordered are listed, but only abnormal results are displayed) Labs Reviewed  COMPREHENSIVE METABOLIC PANEL -  Abnormal; Notable for the following components:      Result Value   Glucose, Bld 122 (*)    BUN 38 (*)    Creatinine, Ser 1.35 (*)    Calcium 8.4 (*)    Total Protein 5.6 (*)    Albumin 2.8 (*)    AST 48 (*)    Total Bilirubin 1.7 (*)    GFR, Estimated 49 (*)    All other components within normal limits  CBC WITH DIFFERENTIAL/PLATELET - Abnormal; Notable for the following components:   RBC 2.89 (*)    Hemoglobin 9.4 (*)    HCT 29.1 (*)    MCV 100.7 (*)    RDW 18.0 (*)    Abs Immature Granulocytes 0.17 (*)    All other components within normal limits  SARS CORONAVIRUS 2 (TAT 6-24 HRS)    EKG None  Radiology DG Chest 1 View  Result Date: 09/25/2020 CLINICAL DATA:  Fall with shoulder pain EXAM: CHEST  1 VIEW COMPARISON:  September 21, 2020 FINDINGS: Two lead left chest pacemaker with leads overlying the right atrium and right ventricle. Heart size is upper limits of normal. Moderate size hiatal hernia. No focal consolidation. No significant pleural effusion or visible pneumothorax. Aortic atherosclerosis. Anterior dislocation of the right humeral head at the glenoid. IMPRESSION: 1. Anterior dislocation of the right humeral head at the glenoid. 2. No acute cardiopulmonary abnormality. Electronically Signed   By: Dahlia Bailiff MD   On: 09/25/2020 19:12   DG Shoulder Right  Result Date: 09/25/2020 CLINICAL DATA:  Fall with pain. EXAM: RIGHT SHOULDER - 2+ VIEW COMPARISON:  September 18, 2020. FINDINGS: Anterior dislocation of the humeral head at the glenoid. Soft tissues are unremarkable. IMPRESSION: Anterior dislocation of the humeral head. Electronically Signed   By: Dahlia Bailiff  MD   On: 09/25/2020 19:08   DG Forearm Right  Result Date: 09/25/2020 CLINICAL DATA:  Fall with shoulder pain EXAM: RIGHT FOREARM - 2 VIEW COMPARISON:  September 18, 2020. FINDINGS: There is no evidence of fracture or other focal bone lesions. Soft tissues are unremarkable. IMPRESSION: Negative. Electronically Signed    By: Dahlia Bailiff MD   On: 09/25/2020 19:04   CT Head Wo Contrast  Result Date: 09/25/2020 CLINICAL DATA:  Un witnessed fall on 04/21, head laceration EXAM: CT HEAD WITHOUT CONTRAST CT CERVICAL SPINE WITHOUT CONTRAST TECHNIQUE: Multidetector CT imaging of the head and cervical spine was performed following the standard protocol without intravenous contrast. Multiplanar CT image reconstructions of the cervical spine were also generated. COMPARISON:  09/21/2020 FINDINGS: CT HEAD FINDINGS Brain: No evidence of acute large vascular territory infarction, hemorrhage, hydrocephalus, extra-axial collection or mass lesion/mass effect. Stable global parenchymal volume loss and ischemic change. Vascular: No hyperdense vessel. Atherosclerotic calcifications of the internal carotid arteries and vertebral arteries at the skull base. Skull: Normal. Negative for fracture or focal lesion. Sinuses/Orbits: The paranasal sinuses and mastoid air cells are predominantly clear. Prior lens surgery. Other: None CT CERVICAL SPINE FINDINGS Alignment: Normal.  No evidence of traumatic listhesis. Skull base and vertebrae: No acute fracture. No primary bone lesion or focal pathologic process. Soft tissues and spinal canal: No prevertebral fluid or swelling. No visible canal hematoma. Disc levels: Stable multilevel degenerative change of the spine including disc space narrowing, uncovertebral and facet hypertrophy as well as osteophytosis most significant in the lower cervical spine at C5-C6 and C6-C7. Upper chest: Negative. Other: None IMPRESSION: Motion degraded examination, within this context: 1. No evidence of acute intracranial pathology. 2. Stable chronic atrophic and ischemic changes compared to prior examination. 3. No evidence of acute fracture or traumatic listhesis of the cervical spine. 4. Stable multilevel degenerative change of the cervical spine. Electronically Signed   By: Dahlia Bailiff MD   On: 09/25/2020 17:02   CT  Chest W Contrast  Result Date: 09/25/2020 CLINICAL DATA:  Unwitnessed fall with bruising to the right side of the body and various stages of healing. EXAM: CT CHEST, ABDOMEN, AND PELVIS WITH CONTRAST TECHNIQUE: Multidetector CT imaging of the chest, abdomen and pelvis was performed following the standard protocol during bolus administration of intravenous contrast. CONTRAST:  152mL OMNIPAQUE IOHEXOL 300 MG/ML  SOLN COMPARISON:  09/18/2020 FINDINGS: CT CHEST FINDINGS Cardiovascular: Normal heart size. No pericardial effusions. Calcification in the aorta, coronary arteries, and mitral valve annulus. Cardiac pacemaker. Normal caliber thoracic aorta with diffuse calcification. Mediastinum/Nodes: No significant lymphadenopathy. Moderate esophageal hiatal hernia. Esophagus is decompressed. Lungs/Pleura: Small bilateral pleural effusions with basilar atelectasis. Peripheral interstitial pattern to the lungs may represent early edema. No focal consolidation. No pneumothorax. Musculoskeletal: Complete anterior dislocation of the right humeral head with respect to the glenoid, new since prior study. Large right shoulder effusion with edema in the visualized right upper arm. Edema and infiltration in the subcutaneous fat of the right axilla and right chest wall. Acute appearing nondisplaced fractures of the right twelfth rib and left seventh rib. Multiple thoracic vertebral compression deformities at T1, T2, and T4 levels are unchanged since prior study. The sternum appears intact. CT ABDOMEN PELVIS FINDINGS Hepatobiliary: Cholelithiasis without evidence of cholecystitis. No bile duct dilatation. No focal liver lesions. Pancreas: Unremarkable. No pancreatic ductal dilatation or surrounding inflammatory changes. Spleen: No splenic injury or perisplenic hematoma. Adrenals/Urinary Tract: No adrenal hemorrhage or renal injury identified. Bladder is unremarkable.  Stomach/Bowel: Stomach, small bowel, and colon are not abnormally  distended. Stool fills the colon. Residual contrast material in the rectosigmoid region. Vascular/Lymphatic: Prominent aortic calcification. No aneurysm. No significant lymphadenopathy. Reproductive: Prostate is unremarkable. Other: No free air or free fluid in the abdomen. Abdominal wall musculature appears intact. Mild edema in the subcutaneous fat along the right flank region, likely contusion. Musculoskeletal: Normal alignment of the lumbar spine. Diffuse degenerative change. No vertebral compression deformities. The sacrum, pelvis, and hips appear intact. IMPRESSION: 1. Complete anterior dislocation of the right humeral head with respect to the glenoid, new since prior study. Large right shoulder effusion with edema in the visualized right upper arm. Edema and infiltration in the subcutaneous fat of the right axilla and right chest wall. 2. Acute appearing nondisplaced fractures of the right twelfth rib and left seventh rib. 3. Multiple thoracic vertebral compression deformities are unchanged since prior study. 4. Small bilateral pleural effusions with basilar atelectasis. Peripheral interstitial pattern to the lungs may represent early edema. 5. Moderate esophageal hiatal hernia. 6. Cholelithiasis without evidence of cholecystitis. 7. Mild edema in the subcutaneous fat along the right flank region, likely contusion. 8. Aortic atherosclerosis. Aortic Atherosclerosis (ICD10-I70.0). Electronically Signed   By: Lucienne Capers M.D.   On: 09/25/2020 20:47   CT Cervical Spine Wo Contrast  Result Date: 09/25/2020 CLINICAL DATA:  Un witnessed fall on 04/21, head laceration EXAM: CT HEAD WITHOUT CONTRAST CT CERVICAL SPINE WITHOUT CONTRAST TECHNIQUE: Multidetector CT imaging of the head and cervical spine was performed following the standard protocol without intravenous contrast. Multiplanar CT image reconstructions of the cervical spine were also generated. COMPARISON:  09/21/2020 FINDINGS: CT HEAD FINDINGS Brain:  No evidence of acute large vascular territory infarction, hemorrhage, hydrocephalus, extra-axial collection or mass lesion/mass effect. Stable global parenchymal volume loss and ischemic change. Vascular: No hyperdense vessel. Atherosclerotic calcifications of the internal carotid arteries and vertebral arteries at the skull base. Skull: Normal. Negative for fracture or focal lesion. Sinuses/Orbits: The paranasal sinuses and mastoid air cells are predominantly clear. Prior lens surgery. Other: None CT CERVICAL SPINE FINDINGS Alignment: Normal.  No evidence of traumatic listhesis. Skull base and vertebrae: No acute fracture. No primary bone lesion or focal pathologic process. Soft tissues and spinal canal: No prevertebral fluid or swelling. No visible canal hematoma. Disc levels: Stable multilevel degenerative change of the spine including disc space narrowing, uncovertebral and facet hypertrophy as well as osteophytosis most significant in the lower cervical spine at C5-C6 and C6-C7. Upper chest: Negative. Other: None IMPRESSION: Motion degraded examination, within this context: 1. No evidence of acute intracranial pathology. 2. Stable chronic atrophic and ischemic changes compared to prior examination. 3. No evidence of acute fracture or traumatic listhesis of the cervical spine. 4. Stable multilevel degenerative change of the cervical spine. Electronically Signed   By: Dahlia Bailiff MD   On: 09/25/2020 17:02   CT ABDOMEN PELVIS W CONTRAST  Result Date: 09/25/2020 CLINICAL DATA:  Unwitnessed fall with bruising to the right side of the body and various stages of healing. EXAM: CT CHEST, ABDOMEN, AND PELVIS WITH CONTRAST TECHNIQUE: Multidetector CT imaging of the chest, abdomen and pelvis was performed following the standard protocol during bolus administration of intravenous contrast. CONTRAST:  175mL OMNIPAQUE IOHEXOL 300 MG/ML  SOLN COMPARISON:  09/18/2020 FINDINGS: CT CHEST FINDINGS Cardiovascular: Normal  heart size. No pericardial effusions. Calcification in the aorta, coronary arteries, and mitral valve annulus. Cardiac pacemaker. Normal caliber thoracic aorta with diffuse calcification. Mediastinum/Nodes: No significant lymphadenopathy.  Moderate esophageal hiatal hernia. Esophagus is decompressed. Lungs/Pleura: Small bilateral pleural effusions with basilar atelectasis. Peripheral interstitial pattern to the lungs may represent early edema. No focal consolidation. No pneumothorax. Musculoskeletal: Complete anterior dislocation of the right humeral head with respect to the glenoid, new since prior study. Large right shoulder effusion with edema in the visualized right upper arm. Edema and infiltration in the subcutaneous fat of the right axilla and right chest wall. Acute appearing nondisplaced fractures of the right twelfth rib and left seventh rib. Multiple thoracic vertebral compression deformities at T1, T2, and T4 levels are unchanged since prior study. The sternum appears intact. CT ABDOMEN PELVIS FINDINGS Hepatobiliary: Cholelithiasis without evidence of cholecystitis. No bile duct dilatation. No focal liver lesions. Pancreas: Unremarkable. No pancreatic ductal dilatation or surrounding inflammatory changes. Spleen: No splenic injury or perisplenic hematoma. Adrenals/Urinary Tract: No adrenal hemorrhage or renal injury identified. Bladder is unremarkable. Stomach/Bowel: Stomach, small bowel, and colon are not abnormally distended. Stool fills the colon. Residual contrast material in the rectosigmoid region. Vascular/Lymphatic: Prominent aortic calcification. No aneurysm. No significant lymphadenopathy. Reproductive: Prostate is unremarkable. Other: No free air or free fluid in the abdomen. Abdominal wall musculature appears intact. Mild edema in the subcutaneous fat along the right flank region, likely contusion. Musculoskeletal: Normal alignment of the lumbar spine. Diffuse degenerative change. No vertebral  compression deformities. The sacrum, pelvis, and hips appear intact. IMPRESSION: 1. Complete anterior dislocation of the right humeral head with respect to the glenoid, new since prior study. Large right shoulder effusion with edema in the visualized right upper arm. Edema and infiltration in the subcutaneous fat of the right axilla and right chest wall. 2. Acute appearing nondisplaced fractures of the right twelfth rib and left seventh rib. 3. Multiple thoracic vertebral compression deformities are unchanged since prior study. 4. Small bilateral pleural effusions with basilar atelectasis. Peripheral interstitial pattern to the lungs may represent early edema. 5. Moderate esophageal hiatal hernia. 6. Cholelithiasis without evidence of cholecystitis. 7. Mild edema in the subcutaneous fat along the right flank region, likely contusion. 8. Aortic atherosclerosis. Aortic Atherosclerosis (ICD10-I70.0). Electronically Signed   By: Lucienne Capers M.D.   On: 09/25/2020 20:47   DG Humerus Right  Result Date: 09/25/2020 CLINICAL DATA:  Fall with pain EXAM: RIGHT HUMERUS - 2+ VIEW COMPARISON:  September 18, 2020 FINDINGS: There is no evidence of humerus fracture. Anterior dislocation of the humeral head at the glenoid soft tissues are unremarkable. IMPRESSION: Anterior shoulder dislocation. No fracture of the humerus visualized. Electronically Signed   By: Dahlia Bailiff MD   On: 09/25/2020 19:09   DG Hand Complete Right  Result Date: 09/25/2020 CLINICAL DATA:  Fall with pain EXAM: RIGHT HAND - COMPLETE 3+ VIEW COMPARISON:  None. FINDINGS: There is no evidence of fracture or dislocation. Degenerative change of the interphalangeal joints as well as the first MTP and CPC joints. Soft tissues are unremarkable. IMPRESSION: No fracture or dislocation of the right hand. Degenerative changes as above. Electronically Signed   By: Dahlia Bailiff MD   On: 09/25/2020 19:14   DG Hip Unilat W or Wo Pelvis 2-3 Views Left  Result  Date: 09/25/2020 CLINICAL DATA:  Fall with pain EXAM: DG HIP (WITH OR WITHOUT PELVIS) 2-3V LEFT COMPARISON:  September 18, 2020 FINDINGS: There is no evidence of hip fracture or dislocation. There is no evidence of arthropathy or other focal bone abnormality. Radiopaque contrast in the colon. Vascular calcifications. Pelvic phleboliths. IMPRESSION: Negative. Electronically Signed   By: Dellis Filbert  Nance Pew MD   On: 09/25/2020 19:10    Procedures Reduction of dislocation  Date/Time: 09/25/2020 11:10 PM Performed by: Lorayne Bender, PA-C Authorized by: Lorayne Bender, PA-C  Consent: Verbal consent obtained. Risks and benefits: risks, benefits and alternatives were discussed Consent given by: patient Patient identity confirmed: verbally with patient and provided demographic data Local anesthesia used: no  Anesthesia: Local anesthesia used: no  Sedation: Patient sedated: yes Sedation type: moderate (conscious) sedation Sedatives: propofol Sedation start date/time: 09/25/2020 11:07 PM Sedation end date/time: 09/25/2020 11:20 PM Vitals: Vital signs were monitored during sedation.  Patient tolerance: patient tolerated the procedure well with no immediate complications Comments: Unsuccessful reduction of the right shoulder      Medications Ordered in ED Medications  iohexol (OMNIPAQUE) 300 MG/ML solution 100 mL (100 mLs Intravenous Contrast Given 09/25/20 2027)  propofol (DIPRIVAN) 10 mg/mL bolus/IV push 36.3 mg (20 mg Intravenous Given 09/25/20 2307)  propofol (DIPRIVAN) 10 mg/mL bolus/IV push 20 mg (20 mg Intravenous Given 09/25/20 2312)  propofol (DIPRIVAN) 10 mg/mL bolus/IV push 20 mg (20 mg Intravenous Given 09/25/20 2314)    ED Course  I have reviewed the triage vital signs and the nursing notes.  Pertinent labs & imaging results that were available during my care of the patient were reviewed by me and considered in my medical decision making (see chart for details).  Clinical Course as of  09/26/20 0014  Mon Sep 25, 2020  1631 Hemoglobin(!): 9.4 Noted to be 8.1 on 09/21/20 in Care Everywhere. [SJ]  2233 Spoke with Dr. Amedeo Kinsman, orthopedic surgeon.  He thinks it is reasonable to attempt reduction once here in the ED.  It is also reasonable to admit the patient.  He will see the patient in the morning regardless of the outcome reduction attempt by our team. [SJ]  2345 Spoke with Dr. Josephine Cables, hospitalist.  Agrees to admit the patient. [SJ]    Clinical Course User Index [SJ] Freeman Borba, Helane Gunther, PA-C   MDM Rules/Calculators/A&P                          Patient presents from a nursing facility after sustaining at least 1 fall, reportedly unwitnessed.  Patient was seen at [facility] April 18 and had a right shoulder dislocation at that time.  He underwent reduction and repeat x-ray confirmed reduction of the dislocation.  He has evidence of significant edema and bruising to the right upper extremity. Recurrence of right anterior shoulder dislocation.  Due to the amount of edema, I strongly suspect this shoulder has been dislocated for several days. I reviewed the patient's chart for more information. I personally reviewed and interpreted the patient's labs and imaging studies. CT of the chest shows 2 rib fractures.    Findings and plan of care discussed with attending physician, Milton Ferguson, MD. Dr. Roderic Palau personally evaluated and examined this patient.  Final Clinical Impression(s) / ED Diagnoses Final diagnoses:  Anterior dislocation of right shoulder, initial encounter    Rx / DC Orders ED Discharge Orders    None       Layla Maw 09/26/20 0014    Milton Ferguson, MD 09/26/20 1203

## 2020-09-25 NOTE — ED Notes (Signed)
With Pa present bilateral pressure wraps to arms removed. Significant edema noted above and below the right compression wrap. Left arm has three dressings over skin tears, dressing dry and intact. Bruising noted to upper back and along top to bottom right side of back. Pt denies pain with palpation down spine. C/o pain to left leg with movement. Able to bend/lift leg, no specific shortening or rotation noted. +pedal pulses.

## 2020-09-26 ENCOUNTER — Inpatient Hospital Stay (HOSPITAL_COMMUNITY): Payer: Medicare Other | Admitting: Certified Registered"

## 2020-09-26 ENCOUNTER — Inpatient Hospital Stay (HOSPITAL_COMMUNITY): Payer: Medicare Other

## 2020-09-26 ENCOUNTER — Encounter (HOSPITAL_COMMUNITY)
Admission: EM | Disposition: A | Discharge status: Skilled Nursing Facility | Payer: Self-pay | Source: Skilled Nursing Facility | Attending: Internal Medicine

## 2020-09-26 DIAGNOSIS — E8809 Other disorders of plasma-protein metabolism, not elsewhere classified: Secondary | ICD-10-CM

## 2020-09-26 DIAGNOSIS — J90 Pleural effusion, not elsewhere classified: Secondary | ICD-10-CM

## 2020-09-26 DIAGNOSIS — N189 Chronic kidney disease, unspecified: Secondary | ICD-10-CM

## 2020-09-26 DIAGNOSIS — K449 Diaphragmatic hernia without obstruction or gangrene: Secondary | ICD-10-CM

## 2020-09-26 DIAGNOSIS — H409 Unspecified glaucoma: Secondary | ICD-10-CM

## 2020-09-26 DIAGNOSIS — S43014A Anterior dislocation of right humerus, initial encounter: Secondary | ICD-10-CM | POA: Diagnosis not present

## 2020-09-26 DIAGNOSIS — E039 Hypothyroidism, unspecified: Secondary | ICD-10-CM

## 2020-09-26 DIAGNOSIS — D539 Nutritional anemia, unspecified: Secondary | ICD-10-CM

## 2020-09-26 HISTORY — PX: SHOULDER CLOSED REDUCTION: SHX1051

## 2020-09-26 LAB — COMPREHENSIVE METABOLIC PANEL
ALT: 33 U/L (ref 0–44)
AST: 43 U/L — ABNORMAL HIGH (ref 15–41)
Albumin: 2.7 g/dL — ABNORMAL LOW (ref 3.5–5.0)
Alkaline Phosphatase: 52 U/L (ref 38–126)
Anion gap: 7 (ref 5–15)
BUN: 33 mg/dL — ABNORMAL HIGH (ref 8–23)
CO2: 24 mmol/L (ref 22–32)
Calcium: 8.3 mg/dL — ABNORMAL LOW (ref 8.9–10.3)
Chloride: 109 mmol/L (ref 98–111)
Creatinine, Ser: 1.25 mg/dL — ABNORMAL HIGH (ref 0.61–1.24)
GFR, Estimated: 54 mL/min — ABNORMAL LOW (ref >60)
Glucose, Bld: 87 mg/dL (ref 70–99)
Potassium: 3.5 mmol/L (ref 3.5–5.1)
Sodium: 140 mmol/L (ref 135–145)
Total Bilirubin: 2.1 mg/dL — ABNORMAL HIGH (ref 0.3–1.2)
Total Protein: 5.4 g/dL — ABNORMAL LOW (ref 6.5–8.1)

## 2020-09-26 LAB — MAGNESIUM: Magnesium: 1.9 mg/dL (ref 1.7–2.4)

## 2020-09-26 LAB — PHOSPHORUS: Phosphorus: 3.3 mg/dL (ref 2.5–4.6)

## 2020-09-26 LAB — CBC
HCT: 28.7 % — ABNORMAL LOW (ref 39.0–52.0)
Hemoglobin: 9.1 g/dL — ABNORMAL LOW (ref 13.0–17.0)
MCH: 32.3 pg (ref 26.0–34.0)
MCHC: 31.7 g/dL (ref 30.0–36.0)
MCV: 101.8 fL — ABNORMAL HIGH (ref 80.0–100.0)
Platelets: 238 10*3/uL (ref 150–400)
RBC: 2.82 MIL/uL — ABNORMAL LOW (ref 4.22–5.81)
RDW: 18.6 % — ABNORMAL HIGH (ref 11.5–15.5)
WBC: 8.9 10*3/uL (ref 4.0–10.5)
nRBC: 0 % (ref 0.0–0.2)

## 2020-09-26 LAB — URINALYSIS, ROUTINE W REFLEX MICROSCOPIC
Bacteria, UA: NONE SEEN
Bilirubin Urine: NEGATIVE
Glucose, UA: NEGATIVE mg/dL
Ketones, ur: NEGATIVE mg/dL
Leukocytes,Ua: NEGATIVE
Nitrite: NEGATIVE
Protein, ur: NEGATIVE mg/dL
Specific Gravity, Urine: 1.036 — ABNORMAL HIGH (ref 1.005–1.030)
pH: 6 (ref 5.0–8.0)

## 2020-09-26 LAB — MRSA PCR SCREENING: MRSA by PCR: NEGATIVE

## 2020-09-26 LAB — FOLATE: Folate: 19.8 ng/mL (ref >5.9)

## 2020-09-26 LAB — VITAMIN B12: Vitamin B-12: 1191 pg/mL — ABNORMAL HIGH (ref 180–914)

## 2020-09-26 LAB — SARS CORONAVIRUS 2 BY RT PCR (HOSPITAL ORDER, PERFORMED IN ~~LOC~~ HOSPITAL LAB): SARS Coronavirus 2: NEGATIVE

## 2020-09-26 LAB — APTT: aPTT: 29 seconds (ref 24–36)

## 2020-09-26 LAB — SARS CORONAVIRUS 2 (TAT 6-24 HRS): SARS Coronavirus 2: NEGATIVE

## 2020-09-26 LAB — PROTIME-INR
INR: 1.1 (ref 0.8–1.2)
Prothrombin Time: 14.4 seconds (ref 11.4–15.2)

## 2020-09-26 SURGERY — CLOSED REDUCTION, SHOULDER
Anesthesia: General | Site: Shoulder | Laterality: Right

## 2020-09-26 MED ORDER — FENTANYL CITRATE (PF) 100 MCG/2ML IJ SOLN
INTRAMUSCULAR | Status: AC
  Filled 2020-09-26: qty 2

## 2020-09-26 MED ORDER — ORAL CARE MOUTH RINSE: 15.0000 mL | Freq: Once | OROMUCOSAL | Status: DC

## 2020-09-26 MED ORDER — PHENYLEPHRINE 40 MCG/ML (10ML) SYRINGE FOR IV PUSH (FOR BLOOD PRESSURE SUPPORT)
PREFILLED_SYRINGE | INTRAVENOUS | Status: DC | PRN
  Administered 2020-09-26: 80 ug via INTRAVENOUS

## 2020-09-26 MED ORDER — CHLORHEXIDINE GLUCONATE CLOTH 2 % EX PADS
6.0000 | MEDICATED_PAD | Freq: Every day | CUTANEOUS | Status: DC
  Administered 2020-09-26 – 2020-09-30 (×5): 6 via TOPICAL

## 2020-09-26 MED ORDER — CHLORHEXIDINE GLUCONATE 0.12 % MT SOLN: 15.0000 mL | Freq: Once | OROMUCOSAL | Status: DC

## 2020-09-26 MED ORDER — RESOURCE THICKENUP CLEAR PO POWD
ORAL | Status: DC | PRN
  Filled 2020-09-26 (×2): qty 125

## 2020-09-26 MED ORDER — ETOMIDATE 2 MG/ML IV SOLN
INTRAVENOUS | Status: DC | PRN
  Administered 2020-09-26: 4 mg via INTRAVENOUS

## 2020-09-26 MED ORDER — VASOPRESSIN 20 UNIT/ML IV SOLN
INTRAVENOUS | Status: AC
  Filled 2020-09-26: qty 1

## 2020-09-26 MED ORDER — ENSURE ENLIVE PO LIQD
237.0000 mL | Freq: Two times a day (BID) | ORAL | Status: DC
  Administered 2020-09-27 – 2020-09-30 (×4): 237 mL via ORAL

## 2020-09-26 MED ORDER — LEVOTHYROXINE SODIUM 100 MCG PO TABS
100.0000 ug | ORAL_TABLET | Freq: Every day | ORAL | Status: DC
  Administered 2020-09-27 – 2020-09-30 (×4): 100 ug via ORAL
  Filled 2020-09-26 (×5): qty 1

## 2020-09-26 MED ORDER — QUETIAPINE FUMARATE 25 MG PO TABS
25.0000 mg | ORAL_TABLET | Freq: Every day | ORAL | Status: DC
  Administered 2020-09-26 – 2020-09-29 (×4): 25 mg via ORAL
  Filled 2020-09-26 (×3): qty 1

## 2020-09-26 MED ORDER — SUGAMMADEX SODIUM 200 MG/2ML IV SOLN
INTRAVENOUS | Status: DC | PRN
  Administered 2020-09-26: 300 mg via INTRAVENOUS

## 2020-09-26 MED ORDER — ONDANSETRON HCL 4 MG/2ML IJ SOLN
INTRAMUSCULAR | Status: DC | PRN
  Administered 2020-09-26: 4 mg via INTRAVENOUS

## 2020-09-26 MED ORDER — ONDANSETRON HCL 4 MG/2ML IJ SOLN
INTRAMUSCULAR | Status: AC
  Filled 2020-09-26: qty 2

## 2020-09-26 MED ORDER — ROCURONIUM BROMIDE 10 MG/ML (PF) SYRINGE
PREFILLED_SYRINGE | INTRAVENOUS | Status: DC | PRN
  Administered 2020-09-26: 30 mg via INTRAVENOUS

## 2020-09-26 MED ORDER — FENTANYL CITRATE (PF) 100 MCG/2ML IJ SOLN: 25.0000 ug | INTRAMUSCULAR | Status: DC | PRN

## 2020-09-26 MED ORDER — LACTATED RINGERS IV SOLN: INTRAVENOUS | Status: DC

## 2020-09-26 MED ORDER — HALOPERIDOL LACTATE 5 MG/ML IJ SOLN
2.0000 mg | Freq: Four times a day (QID) | INTRAMUSCULAR | Status: DC | PRN
  Administered 2020-09-26 – 2020-09-29 (×3): 2 mg via INTRAVENOUS
  Filled 2020-09-26 (×3): qty 1

## 2020-09-26 MED ORDER — PANTOPRAZOLE SODIUM 40 MG IV SOLR
40.0000 mg | INTRAVENOUS | Status: DC
  Administered 2020-09-26 – 2020-09-30 (×5): 40 mg via INTRAVENOUS
  Filled 2020-09-26 (×5): qty 40

## 2020-09-26 MED ORDER — FENTANYL CITRATE (PF) 100 MCG/2ML IJ SOLN
INTRAMUSCULAR | Status: DC | PRN
  Administered 2020-09-26 (×2): 50 ug via INTRAVENOUS

## 2020-09-26 MED ORDER — ETOMIDATE 2 MG/ML IV SOLN
INTRAVENOUS | Status: AC
  Filled 2020-09-26: qty 10

## 2020-09-26 MED ORDER — PROPOFOL 10 MG/ML IV BOLUS
INTRAVENOUS | Status: AC
  Filled 2020-09-26: qty 20

## 2020-09-26 MED ORDER — PROPOFOL 10 MG/ML IV BOLUS
INTRAVENOUS | Status: DC | PRN
  Administered 2020-09-26: 50 mg via INTRAVENOUS

## 2020-09-26 MED ORDER — SUCCINYLCHOLINE CHLORIDE 200 MG/10ML IV SOSY
PREFILLED_SYRINGE | INTRAVENOUS | Status: DC | PRN
  Administered 2020-09-26: 100 mg via INTRAVENOUS

## 2020-09-26 MED ORDER — LIDOCAINE HCL (PF) 2 % IJ SOLN
INTRAMUSCULAR | Status: AC
  Filled 2020-09-26: qty 5

## 2020-09-26 MED ORDER — MORPHINE SULFATE (PF) 2 MG/ML IV SOLN
2.0000 mg | INTRAVENOUS | Status: DC | PRN
  Administered 2020-09-26 – 2020-09-28 (×3): 2 mg via INTRAVENOUS
  Filled 2020-09-26 (×3): qty 1

## 2020-09-26 MED ORDER — LIDOCAINE 2% (20 MG/ML) 5 ML SYRINGE
INTRAMUSCULAR | Status: DC | PRN
  Administered 2020-09-26: 60 mg via INTRAVENOUS

## 2020-09-26 MED ORDER — FUROSEMIDE 10 MG/ML IJ SOLN
20.0000 mg | Freq: Two times a day (BID) | INTRAMUSCULAR | Status: DC
  Administered 2020-09-26 – 2020-09-27 (×3): 20 mg via INTRAVENOUS
  Filled 2020-09-26 (×3): qty 2

## 2020-09-26 SURGICAL SUPPLY — 5 items
BNDG GAUZE ELAST 4 BULKY (GAUZE/BANDAGES/DRESSINGS) ×4 IMPLANT
DRSG TELFA 3X8 NADH (GAUZE/BANDAGES/DRESSINGS) ×2 IMPLANT
KIT TURNOVER KIT A (KITS) ×2 IMPLANT
PAD TELFA 3X4 1S STER (GAUZE/BANDAGES/DRESSINGS) ×2 IMPLANT
SLING ARM IMMOBILIZER LRG (SOFTGOODS) ×2 IMPLANT

## 2020-09-26 NOTE — ED Notes (Signed)
Honey thick fluids per nursing home papers

## 2020-09-26 NOTE — Anesthesia Postprocedure Evaluation (Signed)
Anesthesia Post Note  Patient: Tracy Castillo  Procedure(s) Performed: CLOSED REDUCTION SHOULDER DISLOCATION (Right Shoulder)  Patient location during evaluation: PACU Anesthesia Type: General Level of consciousness: awake Pain management: pain level controlled Vital Signs Assessment: post-procedure vital signs reviewed and stable Respiratory status: spontaneous breathing and respiratory function stable Cardiovascular status: blood pressure returned to baseline and stable Postop Assessment: no apparent nausea or vomiting Anesthetic complications: no   No complications documented.   Last Vitals:  Vitals:   09/26/20 1340 09/26/20 1400  BP: (!) 114/51 (!) 117/46  Pulse: 77 77  Resp: 14 15  Temp:  36.5 C  SpO2: 97% 98%    Last Pain:  Vitals:   09/26/20 1224  TempSrc: Oral  PainSc:                  Tracy Castillo

## 2020-09-26 NOTE — Consult Note (Signed)
ORTHOPAEDIC CONSULTATION  REQUESTING PHYSICIAN: Jerald Kief, MD  ASSESSMENT AND PLAN: 87 y.o. male with the following: recurrent right shoulder dislocation; chronicity unclear  Orthopedics recommends admission to a medical service and we will provide consultation and follow along.  Plan for closed reduction in the OR  - Weight Bearing Status/Activity: NWB in sling  - Additional recommended labs/tests: None  -VTE Prophylaxis: As needed based on medical comorbidities  - Pain control: PRN, at discretion of medicine team in elderly patient  - Follow-up plan: To be determined  -Procedures: Closed reduction of right shoulder in OR  09/26/2020, around noon.  Please keep NPO  Anticipate gradual improvement in swelling and weeping of RUE after reduction.  Will also monitor for return of RUE nerve function following reduction.  Patient has dementia, and it is difficult to discern sensation loss, and accurately assess motor function.  Per family, he was complaining of tingling sensations in the right shoulder area following the initial injury.   Briefly discussed the possibility of not being able to reduce the shoulder despite appropriate anesthesia.  If this is the case, we will not plan to attempt an open reduction today.  If an open procedure is necessary, a further workup will be required to assess for a locked dislocation or further damage to the surrounding structures.  As such, we will need to discuss the procedure and recovery in more detail.  Today's procedure will be an attempted closed reduction only.   Chief Complaint: Right shoulder pain  HPI: Tracy Castillo is a 87 y.o. RHD male with a right shoulder dislocation, but unclear when this occurred.  According to medical records, and the patient's stepson was available at the bedside this morning, he sustained a fall and was evaluated in [place] for a right shoulder dislocation.  Documentation demonstrated successful reduction, and  the patient was subsequently discharged to a rehabilitation facility.  He did not continue to wear his sling as recommended.  He did have at least 1 fall within the first 1-2 days of admission to the rehabilitation facility.  However, no imaging was obtained at that time.  It is unclear when, or what, caused his most recent dislocation.  In addition, it is unclear how long the right shoulder has been dislocated at this time.  Due to significant swelling and discoloration of the right upper extremity, there was enough concern to obtain a more recent x-ray, which demonstrated recurrent dislocation.  Therefore, he was brought to [facility] yesterday for further evaluation.  Attempted reduction in the emergency department was unsuccessful.  As a result, he has been admitted for medical clearance and plan to proceed to the operating room for closed reduction.  In addition, family has noted limited use of his right hand and upper extremity over the last week.  Due to the patient's dementia, it is unclear if he has intact sensation.  He reported the complained of some tingling sensations around the right shoulder since the initial injury.  He also has some rib fractures, but all other imaging has been negative thus far.  Past Medical History:  Diagnosis Date  . Complete heart block (HCC)    a. s/p MDT dual chamber His Bundle pacemaker - Dr Ladona Ridgel  . Dementia (HCC)    "maybe a little; never told us exactly" (09/03/2016)  . Hypothyroidism   . Skin cancer of face    Past Surgical History:  Procedure Laterality Date  . APPENDECTOMY    . CATARACT EXTRACTION  Left    "tried to put lens in but couldn't"  . CATARACT EXTRACTION W/ INTRAOCULAR LENS IMPLANT Right   . PACEMAKER IMPLANT N/A 09/04/2016   MDT dual chamber PPM with His Bundle pacing lead implanted by Dr Lovena Le for complete heart block  . PARS PLANA VITRECTOMY N/A 03/01/2016   Procedure: PARS PLANA VITRECTOMY 25 GAUGE FOR ENDOPHTHALMITIS;  Surgeon: Hayden Pedro, MD;  Location: [facility];  Service: Ophthalmology;  Laterality: N/A;  . SKIN CANCER EXCISION Left    face   Social History   Socioeconomic History  . Marital status: Divorced    Spouse name: Not on file  . Number of children: Not on file  . Years of education: Not on file  . Highest education level: Not on file  Occupational History  . Not on file  Tobacco Use  . Smoking status: Former Smoker    Packs/day: 0.50    Years: 10.00    Pack years: 5.00    Types: Cigarettes  . Smokeless tobacco: Never Used  . Tobacco comment: quit smoking "in 1950"  Substance and Sexual Activity  . Alcohol use: Not on file    Comment: "red wine"  . Drug use: No  . Sexual activity: Not on file  Other Topics Concern  . Not on file  Social History Narrative  . Not on file   Social Determinants of Health   Financial Resource Strain: Not on file  Food Insecurity: Not on file  Transportation Needs: Not on file  Physical Activity: Not on file  Stress: Not on file  Social Connections: Not on file   Family History  Problem Relation Age of Onset  . Cancer - Other Sister        breast   No Known Allergies Prior to Admission medications   Medication Sig Start Date End Date Taking? Authorizing Provider  acetaminophen (TYLENOL) 325 MG tablet Take 650 mg by mouth every 6 (six) hours as needed for mild pain.   Yes [provider]  acetaminophen (TYLENOL) 500 MG tablet Take 500 mg by mouth every 6 (six) hours as needed.   Yes [provider]  ferrous sulfate 325 (65 FE) MG tablet Take 1 tablet by mouth daily. 09/22/20 09/22/21 Yes [provider]  levothyroxine (SYNTHROID) 100 MCG tablet Take 100 mcg by mouth daily. 07/19/20  Yes [provider]  QUEtiapine (SEROQUEL) 25 MG tablet Take 25 mg by mouth at bedtime. 06/15/20  Yes [provider]  senna (SENOKOT) 8.6 MG TABS tablet Take 1 tablet by mouth daily as needed for mild constipation.   Yes [provider]  aspirin 81 MG EC tablet Take 81 mg by mouth daily. Patient not taking: Reported on 09/25/2020    [provider]  donepezil (ARICEPT) 5 MG tablet Take 5 mg by mouth at bedtime. Patient not taking: Reported on 09/25/2020 06/06/20   [provider]  dorzolamide (TRUSOPT) 2 % ophthalmic solution Place 1 drop into the left eye 3 (three) times daily. Patient not taking: No sig reported 03/02/16   Hayden Pedro, MD  latanoprost (XALATAN) 0.005 % ophthalmic solution Place 1 drop into the right eye at bedtime. Patient not taking: Reported on 09/25/2020 02/26/16   [provider]  levothyroxine (SYNTHROID, LEVOTHROID) 88 MCG tablet Take 88 mcg by mouth daily. Patient not taking: Reported on 09/25/2020 08/13/16   [provider]  Omega-3 Fatty Acids (OMEGA-3 FISH OIL PO) Take by mouth daily. Patient not  taking: Reported on 09/25/2020    [provider]  Vit B6-Vit B12-Omega 3 Acids (VITAMIN B PLUS+ PO) Take by mouth daily. Patient not taking: Reported on 09/25/2020    [provider]   DG Chest 1 View  Result Date: 09/25/2020 CLINICAL DATA:  Fall with shoulder pain EXAM: CHEST  1 VIEW COMPARISON:  September 21, 2020 FINDINGS: Two lead left chest pacemaker with leads overlying the right atrium and right ventricle. Heart size is upper limits of normal. Moderate size hiatal hernia. No focal consolidation. No significant pleural effusion or visible pneumothorax. Aortic atherosclerosis. Anterior dislocation of the right humeral head at the glenoid. IMPRESSION: 1. Anterior dislocation of the right humeral head at the glenoid. 2. No acute cardiopulmonary abnormality. Electronically Signed   By: Dahlia Bailiff MD   On: 09/25/2020 19:12   DG Shoulder Right  Result Date: 09/25/2020 CLINICAL DATA:  Right shoulder dislocation, attempted reduction EXAM: RIGHT SHOULDER - 2+ VIEW COMPARISON:  None. FINDINGS: Single-view, frontal radiograph of the right shoulder  demonstrates persistent anteroinferior right glenohumeral dislocation. No definite superimposed fracture. Acromioclavicular joint space appears preserved. There is diffuse subcutaneous edema within the soft tissues of the right shoulder and visualized right upper extremity. Limited evaluation of the right hemithorax is unremarkable. IMPRESSION: Persistent anteroinferior right shoulder dislocation. Electronically Signed   By: Fidela Salisbury MD   On: 09/25/2020 23:37   DG Shoulder Right  Result Date: 09/25/2020 CLINICAL DATA:  Fall with pain. EXAM: RIGHT SHOULDER - 2+ VIEW COMPARISON:  September 18, 2020. FINDINGS: Anterior dislocation of the humeral head at the glenoid. Soft tissues are unremarkable. IMPRESSION: Anterior dislocation of the humeral head. Electronically Signed   By: Dahlia Bailiff MD   On: 09/25/2020 19:08   DG Forearm Right  Result Date: 09/25/2020 CLINICAL DATA:  Fall with shoulder pain EXAM: RIGHT FOREARM - 2 VIEW COMPARISON:  September 18, 2020. FINDINGS: There is no evidence of fracture or other focal bone lesions. Soft tissues are unremarkable. IMPRESSION: Negative. Electronically Signed   By: Dahlia Bailiff MD   On: 09/25/2020 19:04   CT Head Wo Contrast  Result Date: 09/25/2020 CLINICAL DATA:  Un witnessed fall on 04/21, head laceration EXAM: CT HEAD WITHOUT CONTRAST CT CERVICAL SPINE WITHOUT CONTRAST TECHNIQUE: Multidetector CT imaging of the head and cervical spine was performed following the standard protocol without intravenous contrast. Multiplanar CT image reconstructions of the cervical spine were also generated. COMPARISON:  09/21/2020 FINDINGS: CT HEAD FINDINGS Brain: No evidence of acute large vascular territory infarction, hemorrhage, hydrocephalus, extra-axial collection or mass lesion/mass effect. Stable global parenchymal volume loss and ischemic change. Vascular: No hyperdense vessel. Atherosclerotic calcifications of the internal carotid arteries and vertebral arteries at  the skull base. Skull: Normal. Negative for fracture or focal lesion. Sinuses/Orbits: The paranasal sinuses and mastoid air cells are predominantly clear. Prior lens surgery. Other: None CT CERVICAL SPINE FINDINGS Alignment: Normal.  No evidence of traumatic listhesis. Skull base and vertebrae: No acute fracture. No primary bone lesion or focal pathologic process. Soft tissues and spinal canal: No prevertebral fluid or swelling. No visible canal hematoma. Disc levels: Stable multilevel degenerative change of the spine including disc space narrowing, uncovertebral and facet hypertrophy as well as osteophytosis most significant in the lower cervical spine at C5-C6 and C6-C7. Upper chest: Negative. Other: None IMPRESSION: Motion degraded examination, within this context: 1. No evidence of acute intracranial pathology. 2. Stable chronic atrophic and ischemic changes compared to prior examination. 3. No evidence of  acute fracture or traumatic listhesis of the cervical spine. 4. Stable multilevel degenerative change of the cervical spine. Electronically Signed   By: Dahlia Bailiff MD   On: 09/25/2020 17:02   CT Chest W Contrast  Result Date: 09/25/2020 CLINICAL DATA:  Unwitnessed fall with bruising to the right side of the body and various stages of healing. EXAM: CT CHEST, ABDOMEN, AND PELVIS WITH CONTRAST TECHNIQUE: Multidetector CT imaging of the chest, abdomen and pelvis was performed following the standard protocol during bolus administration of intravenous contrast. CONTRAST:  131mL OMNIPAQUE IOHEXOL 300 MG/ML  SOLN COMPARISON:  09/18/2020 FINDINGS: CT CHEST FINDINGS Cardiovascular: Normal heart size. No pericardial effusions. Calcification in the aorta, coronary arteries, and mitral valve annulus. Cardiac pacemaker. Normal caliber thoracic aorta with diffuse calcification. Mediastinum/Nodes: No significant lymphadenopathy. Moderate esophageal hiatal hernia. Esophagus is decompressed. Lungs/Pleura: Small  bilateral pleural effusions with basilar atelectasis. Peripheral interstitial pattern to the lungs may represent early edema. No focal consolidation. No pneumothorax. Musculoskeletal: Complete anterior dislocation of the right humeral head with respect to the glenoid, new since prior study. Large right shoulder effusion with edema in the visualized right upper arm. Edema and infiltration in the subcutaneous fat of the right axilla and right chest wall. Acute appearing nondisplaced fractures of the right twelfth rib and left seventh rib. Multiple thoracic vertebral compression deformities at T1, T2, and T4 levels are unchanged since prior study. The sternum appears intact. CT ABDOMEN PELVIS FINDINGS Hepatobiliary: Cholelithiasis without evidence of cholecystitis. No bile duct dilatation. No focal liver lesions. Pancreas: Unremarkable. No pancreatic ductal dilatation or surrounding inflammatory changes. Spleen: No splenic injury or perisplenic hematoma. Adrenals/Urinary Tract: No adrenal hemorrhage or renal injury identified. Bladder is unremarkable. Stomach/Bowel: Stomach, small bowel, and colon are not abnormally distended. Stool fills the colon. Residual contrast material in the rectosigmoid region. Vascular/Lymphatic: Prominent aortic calcification. No aneurysm. No significant lymphadenopathy. Reproductive: Prostate is unremarkable. Other: No free air or free fluid in the abdomen. Abdominal wall musculature appears intact. Mild edema in the subcutaneous fat along the right flank region, likely contusion. Musculoskeletal: Normal alignment of the lumbar spine. Diffuse degenerative change. No vertebral compression deformities. The sacrum, pelvis, and hips appear intact. IMPRESSION: 1. Complete anterior dislocation of the right humeral head with respect to the glenoid, new since prior study. Large right shoulder effusion with edema in the visualized right upper arm. Edema and infiltration in the subcutaneous fat of  the right axilla and right chest wall. 2. Acute appearing nondisplaced fractures of the right twelfth rib and left seventh rib. 3. Multiple thoracic vertebral compression deformities are unchanged since prior study. 4. Small bilateral pleural effusions with basilar atelectasis. Peripheral interstitial pattern to the lungs may represent early edema. 5. Moderate esophageal hiatal hernia. 6. Cholelithiasis without evidence of cholecystitis. 7. Mild edema in the subcutaneous fat along the right flank region, likely contusion. 8. Aortic atherosclerosis. Aortic Atherosclerosis (ICD10-I70.0). Electronically Signed   By: Lucienne Capers M.D.   On: 09/25/2020 20:47   CT Cervical Spine Wo Contrast  Result Date: 09/25/2020 CLINICAL DATA:  Un witnessed fall on 04/21, head laceration EXAM: CT HEAD WITHOUT CONTRAST CT CERVICAL SPINE WITHOUT CONTRAST TECHNIQUE: Multidetector CT imaging of the head and cervical spine was performed following the standard protocol without intravenous contrast. Multiplanar CT image reconstructions of the cervical spine were also generated. COMPARISON:  09/21/2020 FINDINGS: CT HEAD FINDINGS Brain: No evidence of acute large vascular territory infarction, hemorrhage, hydrocephalus, extra-axial collection or mass lesion/mass effect. Stable global parenchymal volume  loss and ischemic change. Vascular: No hyperdense vessel. Atherosclerotic calcifications of the internal carotid arteries and vertebral arteries at the skull base. Skull: Normal. Negative for fracture or focal lesion. Sinuses/Orbits: The paranasal sinuses and mastoid air cells are predominantly clear. Prior lens surgery. Other: None CT CERVICAL SPINE FINDINGS Alignment: Normal.  No evidence of traumatic listhesis. Skull base and vertebrae: No acute fracture. No primary bone lesion or focal pathologic process. Soft tissues and spinal canal: No prevertebral fluid or swelling. No visible canal hematoma. Disc levels: Stable multilevel  degenerative change of the spine including disc space narrowing, uncovertebral and facet hypertrophy as well as osteophytosis most significant in the lower cervical spine at C5-C6 and C6-C7. Upper chest: Negative. Other: None IMPRESSION: Motion degraded examination, within this context: 1. No evidence of acute intracranial pathology. 2. Stable chronic atrophic and ischemic changes compared to prior examination. 3. No evidence of acute fracture or traumatic listhesis of the cervical spine. 4. Stable multilevel degenerative change of the cervical spine. Electronically Signed   By: Dahlia Bailiff MD   On: 09/25/2020 17:02   CT ABDOMEN PELVIS W CONTRAST  Result Date: 09/25/2020 CLINICAL DATA:  Unwitnessed fall with bruising to the right side of the body and various stages of healing. EXAM: CT CHEST, ABDOMEN, AND PELVIS WITH CONTRAST TECHNIQUE: Multidetector CT imaging of the chest, abdomen and pelvis was performed following the standard protocol during bolus administration of intravenous contrast. CONTRAST:  156mL OMNIPAQUE IOHEXOL 300 MG/ML  SOLN COMPARISON:  09/18/2020 FINDINGS: CT CHEST FINDINGS Cardiovascular: Normal heart size. No pericardial effusions. Calcification in the aorta, coronary arteries, and mitral valve annulus. Cardiac pacemaker. Normal caliber thoracic aorta with diffuse calcification. Mediastinum/Nodes: No significant lymphadenopathy. Moderate esophageal hiatal hernia. Esophagus is decompressed. Lungs/Pleura: Small bilateral pleural effusions with basilar atelectasis. Peripheral interstitial pattern to the lungs may represent early edema. No focal consolidation. No pneumothorax. Musculoskeletal: Complete anterior dislocation of the right humeral head with respect to the glenoid, new since prior study. Large right shoulder effusion with edema in the visualized right upper arm. Edema and infiltration in the subcutaneous fat of the right axilla and right chest wall. Acute appearing nondisplaced  fractures of the right twelfth rib and left seventh rib. Multiple thoracic vertebral compression deformities at T1, T2, and T4 levels are unchanged since prior study. The sternum appears intact. CT ABDOMEN PELVIS FINDINGS Hepatobiliary: Cholelithiasis without evidence of cholecystitis. No bile duct dilatation. No focal liver lesions. Pancreas: Unremarkable. No pancreatic ductal dilatation or surrounding inflammatory changes. Spleen: No splenic injury or perisplenic hematoma. Adrenals/Urinary Tract: No adrenal hemorrhage or renal injury identified. Bladder is unremarkable. Stomach/Bowel: Stomach, small bowel, and colon are not abnormally distended. Stool fills the colon. Residual contrast material in the rectosigmoid region. Vascular/Lymphatic: Prominent aortic calcification. No aneurysm. No significant lymphadenopathy. Reproductive: Prostate is unremarkable. Other: No free air or free fluid in the abdomen. Abdominal wall musculature appears intact. Mild edema in the subcutaneous fat along the right flank region, likely contusion. Musculoskeletal: Normal alignment of the lumbar spine. Diffuse degenerative change. No vertebral compression deformities. The sacrum, pelvis, and hips appear intact. IMPRESSION: 1. Complete anterior dislocation of the right humeral head with respect to the glenoid, new since prior study. Large right shoulder effusion with edema in the visualized right upper arm. Edema and infiltration in the subcutaneous fat of the right axilla and right chest wall. 2. Acute appearing nondisplaced fractures of the right twelfth rib and left seventh rib. 3. Multiple thoracic vertebral compression deformities are unchanged since  prior study. 4. Small bilateral pleural effusions with basilar atelectasis. Peripheral interstitial pattern to the lungs may represent early edema. 5. Moderate esophageal hiatal hernia. 6. Cholelithiasis without evidence of cholecystitis. 7. Mild edema in the subcutaneous fat along  the right flank region, likely contusion. 8. Aortic atherosclerosis. Aortic Atherosclerosis (ICD10-I70.0). Electronically Signed   By: Lucienne Capers M.D.   On: 09/25/2020 20:47   DG Humerus Right  Result Date: 09/25/2020 CLINICAL DATA:  Fall with pain EXAM: RIGHT HUMERUS - 2+ VIEW COMPARISON:  September 18, 2020 FINDINGS: There is no evidence of humerus fracture. Anterior dislocation of the humeral head at the glenoid soft tissues are unremarkable. IMPRESSION: Anterior shoulder dislocation. No fracture of the humerus visualized. Electronically Signed   By: Dahlia Bailiff MD   On: 09/25/2020 19:09   DG Hand Complete Right  Result Date: 09/25/2020 CLINICAL DATA:  Fall with pain EXAM: RIGHT HAND - COMPLETE 3+ VIEW COMPARISON:  None. FINDINGS: There is no evidence of fracture or dislocation. Degenerative change of the interphalangeal joints as well as the first MTP and CPC joints. Soft tissues are unremarkable. IMPRESSION: No fracture or dislocation of the right hand. Degenerative changes as above. Electronically Signed   By: Dahlia Bailiff MD   On: 09/25/2020 19:14   DG Hip Unilat W or Wo Pelvis 2-3 Views Left  Result Date: 09/25/2020 CLINICAL DATA:  Fall with pain EXAM: DG HIP (WITH OR WITHOUT PELVIS) 2-3V LEFT COMPARISON:  September 18, 2020 FINDINGS: There is no evidence of hip fracture or dislocation. There is no evidence of arthropathy or other focal bone abnormality. Radiopaque contrast in the colon. Vascular calcifications. Pelvic phleboliths. IMPRESSION: Negative. Electronically Signed   By: Dahlia Bailiff MD   On: 09/25/2020 19:10   Family History Reviewed and non-contributory, no pertinent history of problems with bleeding or anesthesia    Review of Systems Unable to assess due to the patient's dementia    OBJECTIVE  Vitals: Patient Vitals for the past 8 hrs:  Temp Temp src Pulse SpO2 Height Weight  09/26/20 0439 98.1 F (36.7 C) Axillary -- -- -- --  09/26/20 0400 -- -- 84 99 % -- --   09/26/20 0300 -- -- 73 98 % -- --  09/26/20 0200 -- -- 86 96 % -- --  09/26/20 0152 (!) 97.5 F (36.4 C) Oral -- -- $Rem'5\' 6"'CRAF$  (1.676 m) 64.9 kg   General: Patient is in no obvious discomfort. Cardiovascular: Warm extremities noted Respiratory: No cyanosis, no use of accessory musculature Skin: Right arm is edematous, with significant ecchymosis.  He also has significant discoloration of the forearm and hand. Neurologic: Unable to assess sensation Psychiatric: Patient is demented.  He acknowledges questions, but answers are not understandable. Lymphatic: Significant swelling in the right upper extremity Extremities  RUE:  in a sling.  He has dressings on the forearm with serous drainage.  Limited motion tolerated of his right shoulder.  No active motion demonstrated in his fingers.  Passively can make a full fist.  2+ DP pulse.  No tenderness to palpation over the anterior shoulder.  He is moving bilateral lower extremities spontaneously.  Does not appear to be in discomfort.        Test Results Imaging X-ray of the right shoulder demonstrates an anterior dislocation.  Following reduction, there is no improvement in alignment.  Right shoulder is partially visualized on chest CT scans and demonstrates a shoulder dislocation, without obvious bony abnormality.  No acute fractures are noted around  the shoulder.  Labs cbc Recent Labs    09/25/20 1528 09/26/20 0623  WBC 9.8 8.9  HGB 9.4* 9.1*  HCT 29.1* 28.7*  PLT 236 238    Labs inflam No results for input(s): CRP in the last 72 hours.  Invalid input(s): ESR  Labs coag Recent Labs    09/26/20 0623  INR 1.1    Recent Labs    09/25/20 1528 09/26/20 0623  NA 137 140  K 3.6 3.5  CL 107 109  CO2 23 24  GLUCOSE 122* 87  BUN 38* 33*  CREATININE 1.35* 1.25*  CALCIUM 8.4* 8.3*

## 2020-09-26 NOTE — Anesthesia Procedure Notes (Addendum)
Procedure Name: Intubation Date/Time: 09/26/2020 12:40 PM Performed by: Orlie Dakin, CRNA Pre-anesthesia Checklist: Patient identified, Emergency Drugs available, Suction available and Patient being monitored Patient Re-evaluated:Patient Re-evaluated prior to induction Oxygen Delivery Method: Circle system utilized Preoxygenation: Pre-oxygenation with 100% oxygen Induction Type: IV induction Ventilation: Mask ventilation without difficulty Laryngoscope Size: Miller and 3 Grade View: Grade II Tube type: Oral Tube size: 7.5 mm Number of attempts: 1 Airway Equipment and Method: Stylet Placement Confirmation: ETT inserted through vocal cords under direct vision,  positive ETCO2 and breath sounds checked- equal and bilateral Secured at: 23 cm Tube secured with: Tape Dental Injury: Teeth and Oropharynx as per pre-operative assessment  Comments: 4x4s bite block used

## 2020-09-26 NOTE — ED Notes (Signed)
Right arm completely bruised with weeping edema

## 2020-09-26 NOTE — Progress Notes (Signed)
Patient found naked with his sling off and bandages soaked with blood and urine. MD notified.

## 2020-09-26 NOTE — Progress Notes (Signed)
OT Cancellation Note  Patient Details Name: Tracy Castillo MRN: [id number] DOB: Feb 14, 1925   Cancelled Treatment:    Reason Eval/Treat Not Completed: Patient not medically ready. Per chart review, pt with anterior dislocation of R humeral head and unsuccessful relocation in ED. Pt with ortho consult today. Will check back when pt appropriate for eval.  Larey Seat OT, MOT  Larey Seat 09/26/2020, 4:05 PM

## 2020-09-26 NOTE — Progress Notes (Signed)
Patient refused to eat any dinner

## 2020-09-26 NOTE — Transfer of Care (Signed)
Immediate Anesthesia Transfer of Care Note  Patient: Tracy Castillo  Procedure(s) Performed: CLOSED REDUCTION SHOULDER DISLOCATION (Right Shoulder)  Patient Location: PACU  Anesthesia Type:General  Level of Consciousness: awake  Airway & Oxygen Therapy: Patient Spontanous Breathing and Patient connected to nasal cannula oxygen  Post-op Assessment: Report given to RN and Post -op Vital signs reviewed and stable  Post vital signs: Reviewed and stable  Last Vitals:  Vitals Value Taken Time  BP 123/44 09/26/20 1321  Temp    Pulse 80 09/26/20 1324  Resp 22 09/26/20 1324  SpO2 98 % 09/26/20 1324  Vitals shown include unvalidated device data.  Last Pain:  Vitals:   09/26/20 1224  TempSrc: Oral  PainSc:          Complications: No complications documented.

## 2020-09-26 NOTE — Anesthesia Preprocedure Evaluation (Addendum)
Anesthesia Evaluation  Patient identified by MRN, date of birth, ID band Patient awake    Reviewed: Allergy & Precautions, NPO status , Patient's Chart, lab work & pertinent test results  History of Anesthesia Complications Negative for: history of anesthetic complications  Airway Mallampati: III  TM Distance: >3 FB Neck ROM: Full    Dental  (+) Dental Advisory Given, Missing   Pulmonary former smoker,    Pulmonary exam normal breath sounds clear to auscultation       Cardiovascular Exercise Tolerance: Poor + dysrhythmias (complete heart block) + pacemaker  Rhythm:Irregular Rate:Abnormal     Neuro/Psych PSYCHIATRIC DISORDERS Dementia    GI/Hepatic Neg liver ROS, hiatal hernia,   Endo/Other  Hypothyroidism   Renal/GU Renal InsufficiencyRenal disease     Musculoskeletal   Abdominal   Peds  Hematology  (+) anemia ,   Anesthesia Other Findings   Reproductive/Obstetrics                             Anesthesia Physical Anesthesia Plan  ASA: IV  Anesthesia Plan: General   Post-op Pain Management:    Induction: Intravenous  PONV Risk Score and Plan: Dexamethasone and Ondansetron  Airway Management Planned: Oral ETT  Additional Equipment:   Intra-op Plan:   Post-operative Plan: Extubation in OR  Informed Consent: I have reviewed the patients History and Physical, chart, labs and discussed the procedure including the risks, benefits and alternatives for the proposed anesthesia with the patient or authorized representative who has indicated his/her understanding and acceptance.     Dental advisory given and Consent reviewed with POA  Plan Discussed with: CRNA and Surgeon  Anesthesia Plan Comments:         Anesthesia Quick Evaluation

## 2020-09-26 NOTE — Op Note (Signed)
Orthopaedic Surgery Operative Note (CSN: [id number])  Tracy Castillo  29-Apr-1925 Date of Surgery: 09/26/2020   Diagnoses:  Right shoulder dislocation, unknown chronicity  Procedure: Closed right shoulder dislocation   Operative Finding Successful completion of the planned procedure.  Intraoperative fluoroscopic AP and Grashey views demonstrated appropriate reduction of right glenohumeral joint.  Multiple skin tears on the right forearm were dressed with telfa and Kerlix.    Post-Op Diagnosis: Same Surgeons:Primary: Mordecai Rasmussen, MD Assistants:  None Location: [facility] OR ROOM 4 Anesthesia: General Antibiotics: None indicated in closed reduction procedure Tourniquet time: * No tourniquets in log * Estimated Blood Loss: None Complications: None Specimens: None Implants: * No implants in log *  Indications for Surgery:   Tracy Castillo is a 87 y.o. male with a recurrent right shoulder dislocation.  He previously fell April 18 and underwent a closed reduction at an outside ED.  He was subsequently discharged to a rehabilitation facility and reportedly had multiple falls.  He was not wearing a sling as recommended.  Since admission to the rehab facility and presentation to the ED yesterday, he dislocated his right shoulder again.  Attempted reduction under sedation in the ED was unsuccessful.  In addition, he has significant edema and weeping skin tears in his right arm.  He was admitted for planned closed reduction attempt.  Due tot he patient's dementia, it is unclear how much function he has in the right arm secondary to dislocation.   Benefits and risks of operative and nonoperative management were discussed prior to surgery with the patient's HCPOA and informed consent form was completed.  Specific risks including infection, need for additional surgery, pain, stiffness, need for an open procedure and more severe complications associated with anesthesia.    Procedure:   The patient was  identified properly. Informed consent was obtained and the surgical site was marked. The patient was taken to the OR where general anesthesia was induced.  The patient was positioned supine.  Timeout was performed before the beginning of the case.  Fluoroscopy was readily available in the OR.  Once paralysis had been confirmed, we used a combination of traction and countertraction to reduce the right glenohumeral joint.  The humeral head was palpated and manually reduced.  AP and Grashey views demonstrated a reduced glenohumeral joint.  The contour of his shoulder was improved.   A new skin tear was created on the dorsum of his hand. The skin tears on the forearm and right hand were dressed with telfa and Kerlix.  A new sling immobilizer was placed.    There were no complications.   Patient was awoken taken to PACU in stable condition.   Post-operative plan:  The patient will be NWB in a sling at all times. He will be returned to the floor.   DVT prophylaxis per primary team, no orthopedic contraindications.    Pain control with PRN pain medication preferring oral medicines.   Follow up plan will be scheduled in approximately 14 days for skin check and XR.

## 2020-09-26 NOTE — Plan of Care (Signed)
  Problem: Clinical Measurements: Goal: Will remain free from infection Outcome: Progressing Goal: Diagnostic test results will improve Outcome: Progressing Goal: Respiratory complications will improve Outcome: Progressing   Problem: Pain Managment: Goal: General experience of comfort will improve Outcome: Progressing   Problem: Safety: Goal: Ability to remain free from injury will improve Outcome: Progressing   Problem: Skin Integrity: Goal: Risk for impaired skin integrity will decrease Outcome: Progressing   

## 2020-09-26 NOTE — Progress Notes (Signed)
Administrative Coordinator was notified that patient needs a new sling due to the old sling being soiled.

## 2020-09-26 NOTE — Progress Notes (Signed)
PROGRESS NOTE    Tracy Castillo  ZDG:[id number] DOB: May 21, 1925 DOA: 09/25/2020 PCP: Waldon Reining, NP    Brief Narrative:   87 y.o. male with medical history significant for complete heart block with pacemaker, dementia, hypothyroidism, glaucoma who presented to the emergency department via EMS from [facility] rehab facility due to right shoulder dislocation.  Patient was unable to provide history possibly due to underlying dementia, history was obtained from ED PA and ED medical record.  Per report, patient was reported to be admitted to the facility on 4/21 due to right shoulder dislocation and subsequent reduction.  Patient was reported to be discharged from this facility because patient was not keeping the sling on his right arm.  Patient was reported to have falling about 2 days ago and he was complaining of right shoulder right arm right flank and left hip pain.  Patient denies loss of breath, chest pain, abdominal pain at bedside. Orthopedic Surgery consulted at time of presentation  Assessment & Plan:   Active Problems:   Complete heart block (HCC)   Anterior dislocation of right shoulder   Esophageal hiatal hernia   Bilateral pleural effusion   Hypothyroidism   Glaucoma   Macrocytic anemia   Hypoalbuminemia   Chronic kidney disease    Anterior dislocation of right shoulder Continue with analgesia as needed Pt seen by Orthopedic Surgery, now s/p surgery 4/26 PT/OT consulted, will f/u with recs  Macrocytic anemia MCV 100.7, H/H 9.4/29.1 Folate and vitamin B12 levels reviewed, unremarkable  Hypoalbuminemia Albumin 2.8 Cont with supplemental nutrition as needed  Chronic kidney disease stage IIIA BUN/creatinine 38/1.35 (creatinine is within baseline range) Repeat bmet in AM  Complete heart block Stable, patient has a pacemaker Stable at present  Esophageal hiatal hernia Continue Protonix as tolerated  Bilateral pleural effusion CT of the chest  showed Small bilateral pleural effusions with basilar atelectasis. Peripheral interstitial pattern to the lungs may represent early edema.  Continue IV Lasix 20 Mg twice daily Recheck bmet in AM  Hypothyroidism Continue Synthroid Will check TSH in AM  Glaucoma Patient was not taking home latanoprost per med rec   DVT prophylaxis: SCD's Code Status: Full Family Communication: Pt in room, family not at bedside  Status is: Inpatient  Remains inpatient appropriate because:IV treatments appropriate due to intensity of illness or inability to take PO and Inpatient level of care appropriate due to severity of illness   Dispo: The patient is from: Home              Anticipated d/c is to: Home              Patient currently is not medically stable to d/c.   Difficult to place patient Yes       Consultants:     Procedures:     Antimicrobials: Anti-infectives (From admission, onward)   None       Subjective: Without complaints  Objective: Vitals:   09/26/20 1321 09/26/20 1330 09/26/20 1340 09/26/20 1400  BP: (!) 123/44  (!) 114/51 (!) 117/46  Pulse: 87 94 77 77  Resp: 15 16 14 15   Temp: (!) 97.5 F (36.4 C)   97.7 F (36.5 C)  TempSrc:      SpO2: 100% 100% 97% 98%  Weight:      Height:        Intake/Output Summary (Last 24 hours) at 09/26/2020 1729 Last data filed at 09/26/2020 1328 Gross per 24 hour  Intake 1100 ml  Output  100 ml  Net 1000 ml   Filed Weights   09/25/20 1442 09/26/20 0152  Weight: 72.6 kg 64.9 kg    Examination: General exam: Awake, laying in bed, in nad Respiratory system: Normal respiratory effort, no wheezing Cardiovascular system: regular rate, s1, s2 Gastrointestinal system: Soft, nondistended, positive BS Central nervous system: CN2-12 grossly intact, strength intact Extremities: Perfused, no clubbing Skin: Normal skin turgor, no notable skin lesions seen Psychiatry: Mood normal // no visual hallucinations   Data  Reviewed: I have personally reviewed following labs and imaging studies  CBC: Recent Labs  Lab 09/25/20 1528 09/26/20 0623  WBC 9.8 8.9  NEUTROABS 6.5  --   HGB 9.4* 9.1*  HCT 29.1* 28.7*  MCV 100.7* 101.8*  PLT 236 295   Basic Metabolic Panel: Recent Labs  Lab 09/25/20 1528 09/26/20 0623  NA 137 140  K 3.6 3.5  CL 107 109  CO2 23 24  GLUCOSE 122* 87  BUN 38* 33*  CREATININE 1.35* 1.25*  CALCIUM 8.4* 8.3*  MG  --  1.9  PHOS  --  3.3   GFR: Estimated Creatinine Clearance: 33.3 mL/min (A) (by C-G formula based on SCr of 1.25 mg/dL (H)). Liver Function Tests: Recent Labs  Lab 09/25/20 1528 09/26/20 0623  AST 48* 43*  ALT 34 33  ALKPHOS 59 52  BILITOT 1.7* 2.1*  PROT 5.6* 5.4*  ALBUMIN 2.8* 2.7*   No results for input(s): LIPASE, AMYLASE in the last 168 hours. No results for input(s): AMMONIA in the last 168 hours. Coagulation Profile: Recent Labs  Lab 09/26/20 0623  INR 1.1   Cardiac Enzymes: No results for input(s): CKTOTAL, CKMB, CKMBINDEX, TROPONINI in the last 168 hours. BNP (last 3 results) No results for input(s): PROBNP in the last 8760 hours. HbA1C: No results for input(s): HGBA1C in the last 72 hours. CBG: No results for input(s): GLUCAP in the last 168 hours. Lipid Profile: No results for input(s): CHOL, HDL, LDLCALC, TRIG, CHOLHDL, LDLDIRECT in the last 72 hours. Thyroid Function Tests: No results for input(s): TSH, T4TOTAL, FREET4, T3FREE, THYROIDAB in the last 72 hours. Anemia Panel: Recent Labs    09/26/20 0623  VITAMINB12 1,191*  FOLATE 19.8   Sepsis Labs: No results for input(s): PROCALCITON, LATICACIDVEN in the last 168 hours.  Recent Results (from the past 240 hour(s))  MRSA PCR Screening     Status: None   Collection Time: 09/26/20  1:45 AM   Specimen: Nasal Mucosa; Nasopharyngeal  Result Value Ref Range Status   MRSA by PCR NEGATIVE NEGATIVE Final    Comment:        The GeneXpert MRSA Assay (FDA approved for NASAL  specimens only), is one component of a comprehensive MRSA colonization surveillance program. It is not intended to diagnose MRSA infection nor to guide or monitor treatment for MRSA infections. Performed at [facility], [address], [place], [place] [address]   SARS Coronavirus 2 by RT PCR (hospital order, performed in [facility] hospital lab) Nasopharyngeal Nasopharyngeal Swab     Status: None   Collection Time: 09/26/20 10:01 AM   Specimen: Nasopharyngeal Swab  Result Value Ref Range Status   SARS Coronavirus 2 NEGATIVE NEGATIVE Final    Comment: (NOTE) SARS-CoV-2 target nucleic acids are NOT DETECTED.  The SARS-CoV-2 RNA is generally detectable in upper and lower respiratory specimens during the acute phase of infection. The lowest concentration of SARS-CoV-2 viral copies this assay can detect is 250 copies / mL. A negative result  does not preclude SARS-CoV-2 infection and should not be used as the sole basis for treatment or other patient management decisions.  A negative result may occur with improper specimen collection / handling, submission of specimen other than nasopharyngeal swab, presence of viral mutation(s) within the areas targeted by this assay, and inadequate number of viral copies (<250 copies / mL). A negative result must be combined with clinical observations, patient history, and epidemiological information.  Fact Sheet for Patients:   StrictlyIdeas.no  Fact Sheet for Healthcare Providers: BankingDealers.co.za  This test is not yet approved or  cleared by the [place] FDA and has been authorized for detection and/or diagnosis of SARS-CoV-2 by FDA under an Emergency Use Authorization (EUA).  This EUA will remain in effect (meaning this test can be used) for the duration of the COVID-19 declaration under Section 564(b)(1) of the Act, 21 U.S.C. section 360bbb-3(b)(1), unless the authorization is  terminated or revoked sooner.  Performed at [facility], [address], [place], [place] [address]      Radiology Studies: DG Chest 1 View  Result Date: 09/25/2020 CLINICAL DATA:  Fall with shoulder pain EXAM: CHEST  1 VIEW COMPARISON:  September 21, 2020 FINDINGS: Two lead left chest pacemaker with leads overlying the right atrium and right ventricle. Heart size is upper limits of normal. Moderate size hiatal hernia. No focal consolidation. No significant pleural effusion or visible pneumothorax. Aortic atherosclerosis. Anterior dislocation of the right humeral head at the glenoid. IMPRESSION: 1. Anterior dislocation of the right humeral head at the glenoid. 2. No acute cardiopulmonary abnormality. Electronically Signed   By: Dahlia Bailiff MD   On: 09/25/2020 19:12   DG Shoulder Right  Result Date: 09/26/2020 CLINICAL DATA:  Post reduction shoulder. EXAM: DG C-ARM 1-60 MIN; RIGHT SHOULDER - 2+ VIEW FLUOROSCOPY TIME:  Fluoroscopy Time:  24 seconds. Number of Acquired Spot Images: 2 COMPARISON:  None. FINDINGS: Two C-arm fluoroscopic images were obtained intraoperatively and submitted for post operative interpretation. These images demonstrate reduction of the patient's previously seen shoulder dislocation. Although evaluation is limited on these 2 images, the shoulder appears to be located. Postoperative radiographs could further evaluate if clinically indicated. Please see the performing provider's procedural report for further detail. IMPRESSION: Intraoperative fluoroscopy, as detailed above. Electronically Signed   By: Margaretha Sheffield MD   On: 09/26/2020 14:20   DG Shoulder Right  Result Date: 09/25/2020 CLINICAL DATA:  Right shoulder dislocation, attempted reduction EXAM: RIGHT SHOULDER - 2+ VIEW COMPARISON:  None. FINDINGS: Single-view, frontal radiograph of the right shoulder demonstrates persistent anteroinferior right glenohumeral dislocation. No definite superimposed fracture.  Acromioclavicular joint space appears preserved. There is diffuse subcutaneous edema within the soft tissues of the right shoulder and visualized right upper extremity. Limited evaluation of the right hemithorax is unremarkable. IMPRESSION: Persistent anteroinferior right shoulder dislocation. Electronically Signed   By: Fidela Salisbury MD   On: 09/25/2020 23:37   DG Shoulder Right  Result Date: 09/25/2020 CLINICAL DATA:  Fall with pain. EXAM: RIGHT SHOULDER - 2+ VIEW COMPARISON:  September 18, 2020. FINDINGS: Anterior dislocation of the humeral head at the glenoid. Soft tissues are unremarkable. IMPRESSION: Anterior dislocation of the humeral head. Electronically Signed   By: Dahlia Bailiff MD   On: 09/25/2020 19:08   DG Forearm Right  Result Date: 09/25/2020 CLINICAL DATA:  Fall with shoulder pain EXAM: RIGHT FOREARM - 2 VIEW COMPARISON:  September 18, 2020. FINDINGS: There is no evidence of fracture or other focal bone lesions.  Soft tissues are unremarkable. IMPRESSION: Negative. Electronically Signed   By: Dahlia Bailiff MD   On: 09/25/2020 19:04   CT Head Wo Contrast  Result Date: 09/25/2020 CLINICAL DATA:  Un witnessed fall on 04/21, head laceration EXAM: CT HEAD WITHOUT CONTRAST CT CERVICAL SPINE WITHOUT CONTRAST TECHNIQUE: Multidetector CT imaging of the head and cervical spine was performed following the standard protocol without intravenous contrast. Multiplanar CT image reconstructions of the cervical spine were also generated. COMPARISON:  09/21/2020 FINDINGS: CT HEAD FINDINGS Brain: No evidence of acute large vascular territory infarction, hemorrhage, hydrocephalus, extra-axial collection or mass lesion/mass effect. Stable global parenchymal volume loss and ischemic change. Vascular: No hyperdense vessel. Atherosclerotic calcifications of the internal carotid arteries and vertebral arteries at the skull base. Skull: Normal. Negative for fracture or focal lesion. Sinuses/Orbits: The paranasal sinuses  and mastoid air cells are predominantly clear. Prior lens surgery. Other: None CT CERVICAL SPINE FINDINGS Alignment: Normal.  No evidence of traumatic listhesis. Skull base and vertebrae: No acute fracture. No primary bone lesion or focal pathologic process. Soft tissues and spinal canal: No prevertebral fluid or swelling. No visible canal hematoma. Disc levels: Stable multilevel degenerative change of the spine including disc space narrowing, uncovertebral and facet hypertrophy as well as osteophytosis most significant in the lower cervical spine at C5-C6 and C6-C7. Upper chest: Negative. Other: None IMPRESSION: Motion degraded examination, within this context: 1. No evidence of acute intracranial pathology. 2. Stable chronic atrophic and ischemic changes compared to prior examination. 3. No evidence of acute fracture or traumatic listhesis of the cervical spine. 4. Stable multilevel degenerative change of the cervical spine. Electronically Signed   By: Dahlia Bailiff MD   On: 09/25/2020 17:02   CT Chest W Contrast  Result Date: 09/25/2020 CLINICAL DATA:  Unwitnessed fall with bruising to the right side of the body and various stages of healing. EXAM: CT CHEST, ABDOMEN, AND PELVIS WITH CONTRAST TECHNIQUE: Multidetector CT imaging of the chest, abdomen and pelvis was performed following the standard protocol during bolus administration of intravenous contrast. CONTRAST:  141mL OMNIPAQUE IOHEXOL 300 MG/ML  SOLN COMPARISON:  09/18/2020 FINDINGS: CT CHEST FINDINGS Cardiovascular: Normal heart size. No pericardial effusions. Calcification in the aorta, coronary arteries, and mitral valve annulus. Cardiac pacemaker. Normal caliber thoracic aorta with diffuse calcification. Mediastinum/Nodes: No significant lymphadenopathy. Moderate esophageal hiatal hernia. Esophagus is decompressed. Lungs/Pleura: Small bilateral pleural effusions with basilar atelectasis. Peripheral interstitial pattern to the lungs may represent  early edema. No focal consolidation. No pneumothorax. Musculoskeletal: Complete anterior dislocation of the right humeral head with respect to the glenoid, new since prior study. Large right shoulder effusion with edema in the visualized right upper arm. Edema and infiltration in the subcutaneous fat of the right axilla and right chest wall. Acute appearing nondisplaced fractures of the right twelfth rib and left seventh rib. Multiple thoracic vertebral compression deformities at T1, T2, and T4 levels are unchanged since prior study. The sternum appears intact. CT ABDOMEN PELVIS FINDINGS Hepatobiliary: Cholelithiasis without evidence of cholecystitis. No bile duct dilatation. No focal liver lesions. Pancreas: Unremarkable. No pancreatic ductal dilatation or surrounding inflammatory changes. Spleen: No splenic injury or perisplenic hematoma. Adrenals/Urinary Tract: No adrenal hemorrhage or renal injury identified. Bladder is unremarkable. Stomach/Bowel: Stomach, small bowel, and colon are not abnormally distended. Stool fills the colon. Residual contrast material in the rectosigmoid region. Vascular/Lymphatic: Prominent aortic calcification. No aneurysm. No significant lymphadenopathy. Reproductive: Prostate is unremarkable. Other: No free air or free fluid in the abdomen. Abdominal  wall musculature appears intact. Mild edema in the subcutaneous fat along the right flank region, likely contusion. Musculoskeletal: Normal alignment of the lumbar spine. Diffuse degenerative change. No vertebral compression deformities. The sacrum, pelvis, and hips appear intact. IMPRESSION: 1. Complete anterior dislocation of the right humeral head with respect to the glenoid, new since prior study. Large right shoulder effusion with edema in the visualized right upper arm. Edema and infiltration in the subcutaneous fat of the right axilla and right chest wall. 2. Acute appearing nondisplaced fractures of the right twelfth rib and left  seventh rib. 3. Multiple thoracic vertebral compression deformities are unchanged since prior study. 4. Small bilateral pleural effusions with basilar atelectasis. Peripheral interstitial pattern to the lungs may represent early edema. 5. Moderate esophageal hiatal hernia. 6. Cholelithiasis without evidence of cholecystitis. 7. Mild edema in the subcutaneous fat along the right flank region, likely contusion. 8. Aortic atherosclerosis. Aortic Atherosclerosis (ICD10-I70.0). Electronically Signed   By: Lucienne Capers M.D.   On: 09/25/2020 20:47   CT Cervical Spine Wo Contrast  Result Date: 09/25/2020 CLINICAL DATA:  Un witnessed fall on 04/21, head laceration EXAM: CT HEAD WITHOUT CONTRAST CT CERVICAL SPINE WITHOUT CONTRAST TECHNIQUE: Multidetector CT imaging of the head and cervical spine was performed following the standard protocol without intravenous contrast. Multiplanar CT image reconstructions of the cervical spine were also generated. COMPARISON:  09/21/2020 FINDINGS: CT HEAD FINDINGS Brain: No evidence of acute large vascular territory infarction, hemorrhage, hydrocephalus, extra-axial collection or mass lesion/mass effect. Stable global parenchymal volume loss and ischemic change. Vascular: No hyperdense vessel. Atherosclerotic calcifications of the internal carotid arteries and vertebral arteries at the skull base. Skull: Normal. Negative for fracture or focal lesion. Sinuses/Orbits: The paranasal sinuses and mastoid air cells are predominantly clear. Prior lens surgery. Other: None CT CERVICAL SPINE FINDINGS Alignment: Normal.  No evidence of traumatic listhesis. Skull base and vertebrae: No acute fracture. No primary bone lesion or focal pathologic process. Soft tissues and spinal canal: No prevertebral fluid or swelling. No visible canal hematoma. Disc levels: Stable multilevel degenerative change of the spine including disc space narrowing, uncovertebral and facet hypertrophy as well as  osteophytosis most significant in the lower cervical spine at C5-C6 and C6-C7. Upper chest: Negative. Other: None IMPRESSION: Motion degraded examination, within this context: 1. No evidence of acute intracranial pathology. 2. Stable chronic atrophic and ischemic changes compared to prior examination. 3. No evidence of acute fracture or traumatic listhesis of the cervical spine. 4. Stable multilevel degenerative change of the cervical spine. Electronically Signed   By: Dahlia Bailiff MD   On: 09/25/2020 17:02   CT ABDOMEN PELVIS W CONTRAST  Result Date: 09/25/2020 CLINICAL DATA:  Unwitnessed fall with bruising to the right side of the body and various stages of healing. EXAM: CT CHEST, ABDOMEN, AND PELVIS WITH CONTRAST TECHNIQUE: Multidetector CT imaging of the chest, abdomen and pelvis was performed following the standard protocol during bolus administration of intravenous contrast. CONTRAST:  152mL OMNIPAQUE IOHEXOL 300 MG/ML  SOLN COMPARISON:  09/18/2020 FINDINGS: CT CHEST FINDINGS Cardiovascular: Normal heart size. No pericardial effusions. Calcification in the aorta, coronary arteries, and mitral valve annulus. Cardiac pacemaker. Normal caliber thoracic aorta with diffuse calcification. Mediastinum/Nodes: No significant lymphadenopathy. Moderate esophageal hiatal hernia. Esophagus is decompressed. Lungs/Pleura: Small bilateral pleural effusions with basilar atelectasis. Peripheral interstitial pattern to the lungs may represent early edema. No focal consolidation. No pneumothorax. Musculoskeletal: Complete anterior dislocation of the right humeral head with respect to the glenoid, new since  prior study. Large right shoulder effusion with edema in the visualized right upper arm. Edema and infiltration in the subcutaneous fat of the right axilla and right chest wall. Acute appearing nondisplaced fractures of the right twelfth rib and left seventh rib. Multiple thoracic vertebral compression deformities at T1,  T2, and T4 levels are unchanged since prior study. The sternum appears intact. CT ABDOMEN PELVIS FINDINGS Hepatobiliary: Cholelithiasis without evidence of cholecystitis. No bile duct dilatation. No focal liver lesions. Pancreas: Unremarkable. No pancreatic ductal dilatation or surrounding inflammatory changes. Spleen: No splenic injury or perisplenic hematoma. Adrenals/Urinary Tract: No adrenal hemorrhage or renal injury identified. Bladder is unremarkable. Stomach/Bowel: Stomach, small bowel, and colon are not abnormally distended. Stool fills the colon. Residual contrast material in the rectosigmoid region. Vascular/Lymphatic: Prominent aortic calcification. No aneurysm. No significant lymphadenopathy. Reproductive: Prostate is unremarkable. Other: No free air or free fluid in the abdomen. Abdominal wall musculature appears intact. Mild edema in the subcutaneous fat along the right flank region, likely contusion. Musculoskeletal: Normal alignment of the lumbar spine. Diffuse degenerative change. No vertebral compression deformities. The sacrum, pelvis, and hips appear intact. IMPRESSION: 1. Complete anterior dislocation of the right humeral head with respect to the glenoid, new since prior study. Large right shoulder effusion with edema in the visualized right upper arm. Edema and infiltration in the subcutaneous fat of the right axilla and right chest wall. 2. Acute appearing nondisplaced fractures of the right twelfth rib and left seventh rib. 3. Multiple thoracic vertebral compression deformities are unchanged since prior study. 4. Small bilateral pleural effusions with basilar atelectasis. Peripheral interstitial pattern to the lungs may represent early edema. 5. Moderate esophageal hiatal hernia. 6. Cholelithiasis without evidence of cholecystitis. 7. Mild edema in the subcutaneous fat along the right flank region, likely contusion. 8. Aortic atherosclerosis. Aortic Atherosclerosis (ICD10-I70.0).  Electronically Signed   By: Lucienne Capers M.D.   On: 09/25/2020 20:47   DG Shoulder Right Port  Result Date: 09/26/2020 CLINICAL DATA:  The patient suffered an anterior right shoulder dislocation in a fall today. Status post reduction. EXAM: PORTABLE RIGHT SHOULDER COMPARISON:  Plain films right shoulder earlier today. FINDINGS: The shoulder is now located.  No new abnormality. IMPRESSION: Successful reduction of dislocation. Electronically Signed   By: Inge Rise M.D.   On: 09/26/2020 14:02   DG Humerus Right  Result Date: 09/25/2020 CLINICAL DATA:  Fall with pain EXAM: RIGHT HUMERUS - 2+ VIEW COMPARISON:  September 18, 2020 FINDINGS: There is no evidence of humerus fracture. Anterior dislocation of the humeral head at the glenoid soft tissues are unremarkable. IMPRESSION: Anterior shoulder dislocation. No fracture of the humerus visualized. Electronically Signed   By: Dahlia Bailiff MD   On: 09/25/2020 19:09   DG Hand Complete Right  Result Date: 09/25/2020 CLINICAL DATA:  Fall with pain EXAM: RIGHT HAND - COMPLETE 3+ VIEW COMPARISON:  None. FINDINGS: There is no evidence of fracture or dislocation. Degenerative change of the interphalangeal joints as well as the first MTP and CPC joints. Soft tissues are unremarkable. IMPRESSION: No fracture or dislocation of the right hand. Degenerative changes as above. Electronically Signed   By: Dahlia Bailiff MD   On: 09/25/2020 19:14   DG C-Arm 1-60 Min  Result Date: 09/26/2020 CLINICAL DATA:  Post reduction shoulder. EXAM: DG C-ARM 1-60 MIN; RIGHT SHOULDER - 2+ VIEW FLUOROSCOPY TIME:  Fluoroscopy Time:  24 seconds. Number of Acquired Spot Images: 2 COMPARISON:  None. FINDINGS: Two C-arm fluoroscopic images were obtained intraoperatively  and submitted for post operative interpretation. These images demonstrate reduction of the patient's previously seen shoulder dislocation. Although evaluation is limited on these 2 images, the shoulder appears to be  located. Postoperative radiographs could further evaluate if clinically indicated. Please see the performing provider's procedural report for further detail. IMPRESSION: Intraoperative fluoroscopy, as detailed above. Electronically Signed   By: Margaretha Sheffield MD   On: 09/26/2020 14:20   DG Hip Unilat W or Wo Pelvis 2-3 Views Left  Result Date: 09/25/2020 CLINICAL DATA:  Fall with pain EXAM: DG HIP (WITH OR WITHOUT PELVIS) 2-3V LEFT COMPARISON:  September 18, 2020 FINDINGS: There is no evidence of hip fracture or dislocation. There is no evidence of arthropathy or other focal bone abnormality. Radiopaque contrast in the colon. Vascular calcifications. Pelvic phleboliths. IMPRESSION: Negative. Electronically Signed   By: Dahlia Bailiff MD   On: 09/25/2020 19:10    Scheduled Meds: . Chlorhexidine Gluconate Cloth  6 each Topical Daily  . feeding supplement  237 mL Oral BID BM  . furosemide  20 mg Intravenous Q12H  . levothyroxine  100 mcg Oral Daily  . pantoprazole (PROTONIX) IV  40 mg Intravenous Q24H   Continuous Infusions:   LOS: 1 day   Marylu Lund, MD [facility] Pager On Amion  If 7PM-7AM, please contact night-coverage 09/26/2020, 5:29 PM

## 2020-09-26 NOTE — Progress Notes (Signed)
PT Cancellation Note  Patient Details Name: Tracy Castillo MRN: [id number] DOB: [birth year]   Cancelled Treatment:    Reason Eval/Treat Not Completed: Patient not medically ready. Per chart review, pt with anterior dislocation of R humeral head and unsuccessful relocation in ED. Pt with ortho consult today. Will check back when pt appropriate for eval.   Tori Karenann Mcgrory PT, DPT 09/26/20, 9:06 AM

## 2020-09-27 ENCOUNTER — Inpatient Hospital Stay (HOSPITAL_COMMUNITY): Payer: Medicare Other

## 2020-09-27 ENCOUNTER — Encounter (HOSPITAL_COMMUNITY): Payer: Self-pay | Admitting: Orthopedic Surgery

## 2020-09-27 LAB — CBC
HCT: 29.9 % — ABNORMAL LOW (ref 39.0–52.0)
Hemoglobin: 9.5 g/dL — ABNORMAL LOW (ref 13.0–17.0)
MCH: 32.8 pg (ref 26.0–34.0)
MCHC: 31.8 g/dL (ref 30.0–36.0)
MCV: 103.1 fL — ABNORMAL HIGH (ref 80.0–100.0)
Platelets: 250 10*3/uL (ref 150–400)
RBC: 2.9 MIL/uL — ABNORMAL LOW (ref 4.22–5.81)
RDW: 18.7 % — ABNORMAL HIGH (ref 11.5–15.5)
WBC: 8.2 10*3/uL (ref 4.0–10.5)
nRBC: 0 % (ref 0.0–0.2)

## 2020-09-27 LAB — COMPREHENSIVE METABOLIC PANEL
ALT: 30 U/L (ref 0–44)
AST: 46 U/L — ABNORMAL HIGH (ref 15–41)
Albumin: 2.7 g/dL — ABNORMAL LOW (ref 3.5–5.0)
Alkaline Phosphatase: 49 U/L (ref 38–126)
Anion gap: 10 (ref 5–15)
BUN: 39 mg/dL — ABNORMAL HIGH (ref 8–23)
CO2: 26 mmol/L (ref 22–32)
Calcium: 8.3 mg/dL — ABNORMAL LOW (ref 8.9–10.3)
Chloride: 103 mmol/L (ref 98–111)
Creatinine, Ser: 1.52 mg/dL — ABNORMAL HIGH (ref 0.61–1.24)
GFR, Estimated: 42 mL/min — ABNORMAL LOW (ref >60)
Glucose, Bld: 82 mg/dL (ref 70–99)
Potassium: 3.7 mmol/L (ref 3.5–5.1)
Sodium: 139 mmol/L (ref 135–145)
Total Bilirubin: 2.2 mg/dL — ABNORMAL HIGH (ref 0.3–1.2)
Total Protein: 5.4 g/dL — ABNORMAL LOW (ref 6.5–8.1)

## 2020-09-27 LAB — MAGNESIUM: Magnesium: 1.9 mg/dL (ref 1.7–2.4)

## 2020-09-27 LAB — TSH: TSH: 13.817 u[IU]/mL — ABNORMAL HIGH (ref 0.350–4.500)

## 2020-09-27 NOTE — Plan of Care (Signed)
  Problem: Acute Rehab OT Goals (only OT should resolve) Goal: Pt. Will Perform Eating Flowsheets (Taken 09/27/2020 1118) Pt Will Perform Eating:  with min assist  sitting Goal: Pt. Will Perform Grooming Flowsheets (Taken 09/27/2020 1118) Pt Will Perform Grooming:  with min assist  sitting  bed level Goal: Pt. Will Perform Upper Body Dressing Flowsheets (Taken 09/27/2020 1118) Pt Will Perform Upper Body Dressing:  with mod assist  sitting  bed level Goal: Pt. Will Perform Lower Body Dressing Flowsheets (Taken 09/27/2020 1118) Pt Will Perform Lower Body Dressing:  with mod assist  sitting/lateral leans  with max assist  bed level  with adaptive equipment Goal: Pt. Will Transfer To Toilet Flowsheets (Taken 09/27/2020 1118) Pt Will Transfer to Toilet:  with mod assist  stand pivot transfer  grab bars Goal: Pt. Will Perform Toileting-Clothing Manipulation Flowsheets (Taken 09/27/2020 1118) Pt Will Perform Toileting - Clothing Manipulation and hygiene:  with mod assist  with adaptive equipment  sitting/lateral leans  bed level Goal: Pt/Caregiver Will Perform Home Exercise Program Flowsheets (Taken 09/27/2020 1118) Pt/caregiver will Perform Home Exercise Program:  Left upper extremity  Increased strength  With Supervision  Mahaley Schwering OT, MOT

## 2020-09-27 NOTE — TOC Initial Note (Signed)
Transition of Care [facility]) - Initial/Assessment Note    Patient Details  Name: Tracy Castillo MRN: [id number] Date of Birth: 1925-01-22  Transition of Care [facility]) CM/SW Contact:    Tracy Castillo, Tracy Castillo Phone Number: 09/27/2020, 9:39 AM  Clinical Narrative:                 During chart review CSW noticed pt was brought to ED from SNF at [facility]. CSW reached out to pts contact Tracy Castillo [phone] to inquire if pt has a legal guardian or POA. Ms. Tracy Castillo states that her son Tracy Castillo [phone] is pts POA. CSW informed that I will reach out to Mr. Tracy Castillo to speak about pt, Ms. Dancy ask that CSW wait a while to call him as he cannot answer now. CSW to call Mr. Tracy Castillo this afternoon.   CSW spoke to [place] in admissions at [facility] who states pt was admitted to SNF at [facility] on 4/21. Per Tracy Castillo as long as pts POA is agreeable pt can return for SNF at d/c to [facility]. TOC to speak with Mr. Tracy Castillo this afternoon to f/u.   Expected Discharge Plan: Skilled Nursing Facility Barriers to Discharge: Continued Medical Work up   Patient Goals and CMS Choice Patient states their goals for this hospitalization and ongoing recovery are:: Go to SNF CMS Medicare.gov Compare Post Acute Care list provided to:: Patient Represenative (must comment) Choice offered to / list presented to : Tracy Castillo / Guardian  Expected Discharge Plan and Services Expected Discharge Plan: [place] In-house Referral: Clinical Social Work Discharge Planning Services: CM Consult Post Acute Care Choice: [place] Living arrangements for the past 2 months: [place]                                      Prior Living Arrangements/Services Living arrangements for the past 2 months: [place] Lives with:: Facility Resident Patient language and need for interpreter reviewed:: Yes Do you feel safe going back to the place where you live?: Yes      Need for Family  Participation in Patient Care: Yes (Comment) Care giver support system in place?: Yes (comment)   Criminal Activity/Legal Involvement Pertinent to Current Situation/Hospitalization: No - Comment as needed  Activities of Daily Living Home Assistive Devices/Equipment: None ADL Screening (condition at time of admission) Patient's cognitive ability adequate to safely complete daily activities?: Yes Is the patient deaf or have difficulty hearing?: Yes Does the patient have difficulty seeing, even when wearing glasses/contacts?: Yes Does the patient have difficulty concentrating, remembering, or making decisions?: Yes Patient able to express need for assistance with ADLs?: No Does the patient have difficulty dressing or bathing?: Yes Independently performs ADLs?: No Communication: Needs assistance Is this a change from baseline?: Pre-admission baseline Dressing (OT): Needs assistance Is this a change from baseline?: Pre-admission baseline Grooming: Needs assistance Is this a change from baseline?: Pre-admission baseline Feeding: Appropriate for developmental age Bathing: Needs assistance Is this a change from baseline?: Pre-admission baseline Toileting: Needs assistance Is this a change from baseline?: Pre-admission baseline In/Out Bed: Needs assistance Is this a change from baseline?: Pre-admission baseline Walks in Home: Needs assistance Is this a change from baseline?: Pre-admission baseline Does the patient have difficulty walking or climbing stairs?: Yes Weakness of Legs: Both Weakness of Arms/Hands: Both  Permission Sought/Granted  Emotional Assessment Appearance:: Appears stated age Attitude/Demeanor/Rapport: Unable to Assess Affect (typically observed): Unable to Assess Orientation: : Fluctuating Orientation (Suspected and/or reported [place]) Alcohol / Substance Use: Not Applicable Psych Involvement: No (comment)  Admission diagnosis:  Fall  [W19.XXXA] Anterior dislocation of right shoulder [S43.014A] Anterior dislocation of right shoulder, initial encounter [S43.014A] Patient Active Problem List   Diagnosis Date Noted  . Esophageal hiatal hernia 09/26/2020  . Bilateral pleural effusion 09/26/2020  . Hypothyroidism 09/26/2020  . Glaucoma 09/26/2020  . Macrocytic anemia 09/26/2020  . Hypoalbuminemia 09/26/2020  . Chronic kidney disease 09/26/2020  . Anterior dislocation of right shoulder 09/25/2020  . Complete heart block ([place]) 09/04/2016  . Symptomatic bradycardia 09/03/2016  . Endophthalmitis, left eye 03/01/2016  . Endophthalmitis purulent 03/01/2016   PCP:  Waldon Reining, NP Pharmacy:   [id number] [place], [address] [place] [address] Phone: [phone] Fax: [phone]     Social Determinants of Health (SDOH) Interventions    Readmission Risk Interventions Readmission Risk Prevention Plan 09/27/2020  Transportation Screening Complete  Home Care Screening Complete  Medication Review (RN CM) Complete  Some recent data might be hidden

## 2020-09-27 NOTE — Progress Notes (Signed)
   ORTHOPAEDIC PROGRESS NOTE  s/p Procedure(s): CLOSED REDUCTION SHOULDER DISLOCATION  DOS: 09/26/2020  SUBJECTIVE: Patient relaxing, no family at bedside.  Appears comfortable.  He is not wearing a sling  OBJECTIVE: PE:  Sleeping.  No obvious discomfort.  Shoulder is swollen.  No obvious deformity.  Persistent bruising swelling and bruising in the right arm.  Dressings in place.  Some saturation of the dressings.   Vitals:   09/26/20 2201 09/27/20 0421  BP: (!) 116/98 136/65  Pulse: 76 80  Resp: 19 19  Temp: 98.1 F (36.7 C) 98.2 F (36.8 C)  SpO2: 99% 96%     ASSESSMENT: Tracy Castillo is a 87 y.o. male stable postop; does not tolerate the sling.   PLAN: Weightbearing: NWB RUE Insicional and dressing care: Reinforce dressings as needed Orthopedic device(s): Sling to RUE as much as possible VTE prophylaxis: discretion of primary team.  No orthpaedic contraindications  Pain control: as needed Follow - up plan: 2 weeks   Contact information:     Ignacia Gentzler A. Amedeo Kinsman, MD [place] [address] [place],  [place]  [address] Phone: [phone] Fax: [phone]

## 2020-09-27 NOTE — Evaluation (Signed)
Physical Therapy Evaluation Patient Details Name: Tracy Castillo MRN: [id number] DOB: 17-Mar-1925 Today's Date: 09/27/2020   History of Present Illness  HPI: Tracy Castillo is a 87 y.o. male with medical history significant for complete heart block with pacemaker, dementia, hypothyroidism, glaucoma who presented to the emergency department via EMS from [facility] rehab facility due to right shoulder dislocation.  Patient was unable to provide history possibly due to underlying dementia, history was obtained from ED PA and ED medical record.  Per report, patient was reported to be admitted to the facility on 4/21 due to right shoulder dislocation and subsequent reduction.  Patient was reported to be discharged from this facility because patient was not keeping the sling on his right arm.  Patient was reported to have falling about 2 days ago and he was complaining of right shoulder right arm right flank and left hip pain.  Patient denies loss of breath, chest pain, abdominal pain at bedside.    Clinical Impression  Patient present with skin tears to RUE and dressed with absorbent pads and kerlix dressing before apply sling requiring Max assist to don.  Patient also have severe bruising to right side and unsafe to use gait belt or put pressure on right side.  Patient demonstrates slow labored movement for sitting up at bedside, able to complete a couple of sit stands with Max hand held assist, at high risk for falls and unable to take steps or stand pivot to transfer to chair due to weakness.  Patient required 2 person assist to reposition when put back to bed.  Patient will benefit from continued physical therapy in hospital and recommended venue below to increase strength, balance, endurance for safe ADLs and gait.      Follow Up Recommendations SNF    Equipment Recommendations  None recommended by PT    Recommendations for Other Services       Precautions / Restrictions  Precautions Precautions: Fall;ICD/Pacemaker;Shoulder Type of Shoulder Precautions: NWB Shoulder Interventions: Shoulder sling/immobilizer Precaution Booklet Issued: Yes (comment) Precaution Comments: NWB R UE; prone to skin tearing Restrictions Weight Bearing Restrictions: Yes RUE Weight Bearing: Non weight bearing      Mobility  Bed Mobility Overal bed mobility: Needs Assistance Bed Mobility: Supine to Sit;Sit to Supine     Supine to sit: Mod assist;Max assist Sit to supine: Mod assist;Max assist   General bed mobility comments: slow labored movement, requires Max verbal/tactile cueing    Transfers Overall transfer level: Needs assistance Equipment used: 1 person hand held assist;Hemi-walker Transfers: Sit to/from Stand Sit to Stand: Max assist         General transfer comment: Patient demonstrates poor carryover for using Hemi-walker, able to stand with left hand held assist, but unable to take steps due to poor standing balance  Ambulation/Gait                Stairs            Wheelchair Mobility    Modified Rankin (Stroke Patients Only)       Balance Overall balance assessment: Needs assistance Sitting-balance support: Feet supported;Single extremity supported Sitting balance-Leahy Scale: Fair Sitting balance - Comments: EOB   Standing balance support: During functional activity;Single extremity supported Standing balance-Leahy Scale: Poor Standing balance comment: Hand held assist                             Pertinent Vitals/Pain Pain Assessment: Faces Faces Pain  Scale: Hurts even more Pain Location: R shoulder Pain Descriptors / Indicators: Grimacing;Guarding Pain Intervention(s): Limited activity within patient's tolerance;Monitored during session;Repositioned    Home Living Family/patient expects to be discharged to:: Skilled nursing facility                 Additional Comments: Pt came from [facility]  per document review.    Prior Function Level of Independence: Needs assistance   Gait / Transfers Assistance Needed: Pt unable to report history. Assist likely needed for transfers and ambulation likely at prior rehab facility.  ADL's / Homemaking Assistance Needed: Likely needed assist at prior facility.        Hand Dominance   Dominant Hand:  (Unable to report.)    Extremity/Trunk Assessment   Upper Extremity Assessment Upper Extremity Assessment: Defer to OT evaluation RUE Deficits / Details: Post Closed right shoulder dislocation; NWB in R UE; pt R UE place in sling.    Lower Extremity Assessment Lower Extremity Assessment: Generalized weakness    Cervical / Trunk Assessment Cervical / Trunk Assessment: Kyphotic  Communication   Communication: Expressive difficulties;Receptive difficulties  Cognition Arousal/Alertness: Awake/alert Behavior During Therapy: Impulsive;Flat affect Overall Cognitive Status: History of cognitive impairments - at baseline                                        General Comments General comments (skin integrity, edema, etc.): Poor skin integrity. 3 skin tears noted upon arrival to R forearm. PT applied bandage during session and notified nursing.    Exercises     Assessment/Plan    PT Assessment Patient needs continued PT services  PT Problem List Decreased strength;Decreased activity tolerance;Decreased balance;Decreased mobility       PT Treatment Interventions DME instruction;Gait training;Stair training;Functional mobility training;Therapeutic activities;Therapeutic exercise;Balance training;Patient/family education    PT Goals (Current goals can be found in the Care Plan section)  Acute Rehab PT Goals Patient Stated Goal: no goal stated PT Goal Formulation: With patient Time For Goal Achievement: 10/11/20 Potential to Achieve Goals: Fair    Frequency Min 2X/week   Barriers to discharge         Co-evaluation PT/OT/SLP Co-Evaluation/Treatment: Yes Reason for Co-Treatment: Complexity of the patient's impairments (multi-system involvement);For patient/therapist safety;To address functional/ADL transfers PT goals addressed during session: Mobility/safety with mobility;Balance;Strengthening/ROM;Proper use of DME OT goals addressed during session: ADL's and self-care       AM-PAC PT "6 Clicks" Mobility  Outcome Measure Help needed turning from your back to your side while in a flat bed without using bedrails?: A Lot Help needed moving from lying on your back to sitting on the side of a flat bed without using bedrails?: A Lot Help needed moving to and from a bed to a chair (including a wheelchair)?: Total Help needed standing up from a chair using your arms (e.g., wheelchair or bedside chair)?: A Lot Help needed to walk in hospital room?: Total Help needed climbing 3-5 steps with a railing? : Total 6 Click Score: 9    End of Session   Activity Tolerance: Patient tolerated treatment well;Patient limited by fatigue;Patient limited by lethargy Patient left: in bed;with call bell/phone within reach;with bed alarm set Nurse Communication: Mobility status PT Visit Diagnosis: Unsteadiness on feet (R26.81);Other abnormalities of gait and mobility (R26.89);Muscle weakness (generalized) (M62.81)    Time: 8338-2505 PT Time Calculation (min) (ACUTE ONLY): 32 min  Charges:   PT Evaluation $PT Eval Moderate Complexity: 1 Mod PT Treatments $Therapeutic Activity: 23-37 mins        1:40 PM, 09/27/20 Lonell Grandchild, MPT Physical Therapist with [facility] [phone] office [phone] mobile phone

## 2020-09-27 NOTE — Progress Notes (Signed)
Patient ate a few bites of breakfast and drank some fluids. Patient was falling asleep while eating.

## 2020-09-27 NOTE — TOC Progression Note (Signed)
CSW spoke with pts POA Tracy Castillo about PT/OT recommending pt return to SNF for rehab. Mr. Tracy Castillo is understanding. CSW informed Mr. Tracy Castillo that [name] is agreeable to pt returning, he is agreeable to pt returning to [facility] when he is medically cleared for d/c.

## 2020-09-27 NOTE — Evaluation (Signed)
Occupational Therapy Evaluation Patient Details Name: Arkin Imran MRN: [id number] DOB: Nov 28, 1924 Today's Date: 09/27/2020    History of Present Illness HPI: Jermone Geister is a 87 y.o. male with medical history significant for complete heart block with pacemaker, dementia, hypothyroidism, glaucoma who presented to the emergency department via EMS from [facility] rehab facility due to right shoulder dislocation.  Patient was unable to provide history possibly due to underlying dementia, history was obtained from ED PA and ED medical record.  Per report, patient was reported to be admitted to the facility on 4/21 due to right shoulder dislocation and subsequent reduction.  Patient was reported to be discharged from this facility because patient was not keeping the sling on his right arm.  Patient was reported to have falling about 2 days ago and he was complaining of right shoulder right arm right flank and left hip pain.  Patient denies loss of breath, chest pain, abdominal pain at bedside.   Clinical Impression   Pt impulsive and struggled to follow one step directions this date. Upon arrival to pt's room 3 skin tears were noted on pt R forearm. PT applied bandage prior to continuing evaluation. Pt required mod to max A for bed mobility and Max A for partial sit to stand from EOB. R sling placed on pt's R UE per post op requirements. Pt may benefit from R UE immobilization sling due to pt impulsivity and likely attempts to remove sling. Pt will benefit from continued OT in the hospital and recommended venue below to increase strength, balance, and endurance for safe ADL's.     Follow Up Recommendations  SNF;Supervision/Assistance - 24 hour    Equipment Recommendations  None recommended by OT           Precautions / Restrictions Precautions Precautions: Fall;ICD/Pacemaker;Shoulder Type of Shoulder Precautions: NWB Shoulder Interventions: Shoulder sling/immobilizer Precaution Booklet  Issued: No Precaution Comments: NWB R UE; prone to skin tearing Restrictions Weight Bearing Restrictions: Yes RUE Weight Bearing: Non weight bearing      Mobility Bed Mobility Overal bed mobility: Needs Assistance Bed Mobility: Supine to Sit;Sit to Supine     Supine to sit: Mod assist;Max assist Sit to supine: Mod assist;Max assist   General bed mobility comments: slow labored movement    Transfers Overall transfer level: Needs assistance   Transfers: Sit to/from Stand Sit to Stand: Max assist         General transfer comment: Partial stand completed with max A provided by PT prior to return to sitting at EOB.    Balance Overall balance assessment: Needs assistance Sitting-balance support: Feet supported Sitting balance-Leahy Scale: Fair (poor/fiar) Sitting balance - Comments: EOB   Standing balance support: Bilateral upper extremity supported;During functional activity Standing balance-Leahy Scale: Poor Standing balance comment: Poor in brief partial stand with support.                           ADL either performed or assessed with clinical judgement   ADL Overall ADL's : Needs assistance/impaired                     Lower Body Dressing: Total assistance;Bed level Lower Body Dressing Details (indicate cue type and reason): total assist to don socks at bed level                     Vision Patient Visual Report: Other (comment) (Unable to report visual history.)  Pertinent Vitals/Pain Pain Assessment: Faces Faces Pain Scale: Hurts little more Pain Location: R shoulder Pain Descriptors / Indicators: Grimacing Pain Intervention(s): Limited activity within patient's tolerance;Monitored during session;Repositioned     Hand Dominance  (Unable to report.)   Extremity/Trunk Assessment Upper Extremity Assessment Upper Extremity Assessment: Generalized weakness;RUE deficits/detail RUE Deficits / Details: Post  Closed right shoulder dislocation; NWB in R UE; pt R UE place in sling.   Lower Extremity Assessment Lower Extremity Assessment: Defer to PT evaluation   Cervical / Trunk Assessment Cervical / Trunk Assessment: Kyphotic   Communication Communication Communication: Expressive difficulties;Receptive difficulties   Cognition Arousal/Alertness: Lethargic Behavior During Therapy: Impulsive (attempting to remove condom catheter; upon arrival pt bandage to R forearm was removed and pt had torn his skin in 3 areas on R forearm.) Overall Cognitive Status: History of cognitive impairments - at baseline                                     General Comments  Poor skin integrity. 3 skin tears noted upon arrival to R forearm. PT applied bandage during session and notified nursing.               Home Living Family/patient expects to be discharged to:: Skilled nursing facility                                 Additional Comments: Pt came from [facility] per document review.      Prior Functioning/Environment Level of Independence: Needs assistance  Gait / Transfers Assistance Needed: Pt unable to report history. Assist likely needed for transfers and ambulation likely at prior rehab facility. ADL's / Homemaking Assistance Needed: Likely needed assist at prior facility.            OT Problem List: Decreased strength;Decreased range of motion;Decreased activity tolerance;Impaired balance (sitting and/or standing);Decreased coordination;Impaired UE functional use      OT Treatment/Interventions: Self-care/ADL training;Therapeutic exercise;Therapeutic activities;Cognitive remediation/compensation;Patient/family education;Balance training    OT Goals(Current goals can be found in the care plan section) Acute Rehab OT Goals Patient Stated Goal: no goal stated OT Goal Formulation: Patient unable to participate in goal setting Time For Goal Achievement:  10/11/20 Potential to Achieve Goals: Fair  OT Frequency: Min 2X/week   Barriers to D/C:            Co-evaluation PT/OT/SLP Co-Evaluation/Treatment: Yes     OT goals addressed during session: ADL's and self-care                       End of Session Equipment Utilized During Treatment: Rolling walker Nurse Communication: Other (comment) (PT notified nursing of skin tears.)  Activity Tolerance: Other (comment) (Pt impulsive; stuggled to follow 1 step directions) Patient left: in bed;with bed alarm set;with call bell/phone within reach  OT Visit Diagnosis: Unsteadiness on feet (R26.81);Other abnormalities of gait and mobility (R26.89);Repeated falls (R29.6);Muscle weakness (generalized) (M62.81);History of falling (Z91.81);Cognitive communication deficit (R41.841)                Time: 5573-2202 OT Time Calculation (min): 45 min Charges:  OT General Charges $OT Visit: 1 Visit OT Evaluation $OT Eval Moderate Complexity: 1 Mod  Shanie Mauzy OT, MOT   Larey Seat 09/27/2020, 11:02 AM

## 2020-09-27 NOTE — Plan of Care (Signed)
  Problem: Acute Rehab PT Goals(only PT should resolve) Goal: Pt Will Go Supine/Side To Sit Outcome: Progressing Flowsheets (Taken 09/27/2020 1342) Pt will go Supine/Side to Sit: with moderate assist Goal: Patient Will Transfer Sit To/From Stand Outcome: Progressing Flowsheets (Taken 09/27/2020 1342) Patient will transfer sit to/from stand: with moderate assist Goal: Pt Will Transfer Bed To Chair/Chair To Bed Outcome: Progressing Flowsheets (Taken 09/27/2020 1342) Pt will Transfer Bed to Chair/Chair to Bed: with mod assist Goal: Pt Will Ambulate Outcome: Progressing Flowsheets (Taken 09/27/2020 1342) Pt will Ambulate:  10 feet  with moderate assist  with cane Note: Hemi-walker or wide based quad cane   1:43 PM, 09/27/20 Lonell Grandchild, MPT Physical Therapist with [facility] [phone] office [phone] mobile phone

## 2020-09-27 NOTE — Progress Notes (Signed)
PROGRESS NOTE    Tracy Castillo  [id number] DOB: 11/24/1924 DOA: 09/25/2020 PCP: Waldon Reining, NP    Brief Narrative:   87 y.o. male with medical history significant for complete heart block with pacemaker, dementia, hypothyroidism, glaucoma who presented to the emergency department via EMS from [facility] rehab facility due to right shoulder dislocation.  Patient was unable to provide history possibly due to underlying dementia, history was obtained from ED PA and ED medical record.  Per report, patient was reported to be admitted to the facility on 4/21 due to right shoulder dislocation and subsequent reduction.  Patient was reported to be discharged from this facility because patient was not keeping the sling on his right arm.  Patient was reported to have falling about 2 days ago and he was complaining of right shoulder right arm right flank and left hip pain.  Patient denies loss of breath, chest pain, abdominal pain at bedside. Orthopedic Surgery consulted at time of presentation   Assessment & Plan:    Anterior dislocation of right shoulder Patient was seen by the orthopedic service.  Underwent close reduction.  Pain control.  Other management per orthopedics.    Macrocytic anemia MCV 100.7, H/H 9.4/29.1 Folate and vitamin B12 levels reviewed, unremarkable TSH noted to be quite elevated which could be contributing.  See below.  Hypoalbuminemia Albumin 2.8 Cont with supplemental nutrition as needed  Chronic kidney disease stage IIIA BUN/creatinine 38/1.35.  Rise in creatinine noted this morning.  Patient noted to be on furosemide.  He has had poor oral intake.  Could be somewhat volume depleted.  Hold his Lasix today encourage oral intake.  Recheck labs tomorrow.  Monitor urine output.  Complete heart block Stable, patient has a pacemaker Stable at present  Esophageal hiatal hernia Continue Protonix as tolerated  Bilateral pleural effusion CT of the chest  showed Small bilateral pleural effusions with basilar atelectasis.  There was some concern for interstitial edema.  He was placed on IV Lasix.  Saturations noted to be in the mid to late 90s.  Does not appear to be tachypneic.  Hold the Lasix for now due to rising creatinine.  Monitor volume status closely  Hypothyroidism Levothyroxine being continued.  TSH noted to be 13. Will check FT4.  May need to increase the dose of levothyroxine.  Glaucoma Patient was not taking home latanoprost per med rec.  Acute Rib Fracture involving right 12th and left seventh Does not appear to be in any significant pain at this time.  Continue to monitor.  Incentive spirometry.  Multiple thoracic vertebral compression deformities These are chronic based on CT report.  Cholelithiasis Stable.  Outpatient monitoring.  History of dementia Stable.  Discussed with patient's power of attorney Aleen Sells who was living with the patient till recently when the patient had to go to skilled nursing facility.  He mentioned that patient's dementia waxes and wanes.  Whenever he is in a new environment his mentation gets worse.  Goals of care CODE STATUS was discussed with the [name] who mentioned that patient has expressed wishes for full scope of care in the past.  PT does understand is that if patient's dementia continues to get worse then it may be more prudent to start talking about limiting aggressive care and interventions.  DVT prophylaxis: SCD's Code Status: Full Family Communication: Pt in room, family not at bedside Disposition: Most likely will need to go to skilled nursing facility.  Status is: Inpatient  Remains inpatient  appropriate because:IV treatments appropriate due to intensity of illness or inability to take PO and Inpatient level of care appropriate due to severity of illness   Dispo: The patient is from: Home              Anticipated d/c is to: Home              Patient currently is not  medically stable to d/c.   Difficult to place patient Yes    Consultants:   Orthopedics  Procedures:   Closed reduction of right shoulder dislocation  Antimicrobials: Anti-infectives (From admission, onward)   None      Subjective: Noted to be pleasantly confused this morning.  Does not appear to be in any discomfort.  Objective: Vitals:   09/26/20 1340 09/26/20 1400 09/26/20 2201 09/27/20 0421  BP: (!) 114/51 (!) 117/46 (!) 116/98 136/65  Pulse: 77 77 76 80  Resp: 14 15 19 19   Temp:  97.7 F (36.5 C) 98.1 F (36.7 C) 98.2 F (36.8 C)  TempSrc:      SpO2: 97% 98% 99% 96%  Weight:      Height:        Intake/Output Summary (Last 24 hours) at 09/27/2020 0758 Last data filed at 09/26/2020 1328 Gross per 24 hour  Intake 1100 ml  Output --  Net 1100 ml   Filed Weights   09/25/20 1442 09/26/20 0152  Weight: 72.6 kg 64.9 kg    Examination:  General appearance: Awake alert.  In no distress.  Pleasantly confused Resp: Clear to auscultation bilaterally.  Normal effort Cardio: S1-S2 is normal regular.  No S3-S4.  No rubs murmurs or bruit GI: Abdomen is soft.  Nontender nondistended.  Bowel sounds are present normal.  No masses organomegaly Extremities: Right arm noted to be swollen.  Good radial pulses. Neurologic:  No focal neurological deficits.     Data Reviewed: I have personally reviewed following labs and imaging studies  CBC: Recent Labs  Lab 09/25/20 1528 09/26/20 0623 09/27/20 0513  WBC 9.8 8.9 8.2  NEUTROABS 6.5  --   --   HGB 9.4* 9.1* 9.5*  HCT 29.1* 28.7* 29.9*  MCV 100.7* 101.8* 103.1*  PLT 236 238 595   Basic Metabolic Panel: Recent Labs  Lab 09/25/20 1528 09/26/20 0623 09/27/20 0513  NA 137 140 139  K 3.6 3.5 3.7  CL 107 109 103  CO2 23 24 26   GLUCOSE 122* 87 82  BUN 38* 33* 39*  CREATININE 1.35* 1.25* 1.52*  CALCIUM 8.4* 8.3* 8.3*  MG  --  1.9 1.9  PHOS  --  3.3  --    GFR: Estimated Creatinine Clearance: 27.4 mL/min (A)  (by C-G formula based on SCr of 1.52 mg/dL (H)). Liver Function Tests: Recent Labs  Lab 09/25/20 1528 09/26/20 0623 09/27/20 0513  AST 48* 43* 46*  ALT 34 33 30  ALKPHOS 59 52 49  BILITOT 1.7* 2.1* 2.2*  PROT 5.6* 5.4* 5.4*  ALBUMIN 2.8* 2.7* 2.7*   Coagulation Profile: Recent Labs  Lab 09/26/20 0623  INR 1.1   Anemia Panel: Recent Labs    09/26/20 0623  VITAMINB12 1,191*  FOLATE 19.8     Recent Results (from the past 240 hour(s))  SARS CORONAVIRUS 2 (TAT 6-24 HRS) Nasopharyngeal Nasopharyngeal Swab     Status: None   Collection Time: 09/25/20  9:23 PM   Specimen: Nasopharyngeal Swab  Result Value Ref Range Status   SARS Coronavirus 2 NEGATIVE NEGATIVE Final  Comment: (NOTE) SARS-CoV-2 target nucleic acids are NOT DETECTED.  The SARS-CoV-2 RNA is generally detectable in upper and lower respiratory specimens during the acute phase of infection. Negative results do not preclude SARS-CoV-2 infection, do not rule out co-infections with other pathogens, and should not be used as the sole basis for treatment or other patient management decisions. Negative results must be combined with clinical observations, patient history, and epidemiological information. The expected result is Negative.  Fact Sheet for Patients: SugarRoll.be  Fact Sheet for Healthcare Providers: https://www.woods-mathews.com/  This test is not yet approved or cleared by the [place] FDA and  has been authorized for detection and/or diagnosis of SARS-CoV-2 by FDA under an Emergency Use Authorization (EUA). This EUA will remain  in effect (meaning this test can be used) for the duration of the COVID-19 declaration under Se ction 564(b)(1) of the Act, 21 U.S.C. section 360bbb-3(b)(1), unless the authorization is terminated or revoked sooner.  Performed at [facility], [place] [address] [place], [place] [address]   MRSA PCR Screening      Status: None   Collection Time: 09/26/20  1:45 AM   Specimen: Nasal Mucosa; Nasopharyngeal  Result Value Ref Range Status   MRSA by PCR NEGATIVE NEGATIVE Final    Comment:        The GeneXpert MRSA Assay (FDA approved for NASAL specimens only), is one component of a comprehensive MRSA colonization surveillance program. It is not intended to diagnose MRSA infection nor to guide or monitor treatment for MRSA infections. Performed at [facility], [address], [place], [place] [address]   SARS Coronavirus 2 by RT PCR (hospital order, performed in [facility] hospital lab) Nasopharyngeal Nasopharyngeal Swab     Status: None   Collection Time: 09/26/20 10:01 AM   Specimen: Nasopharyngeal Swab  Result Value Ref Range Status   SARS Coronavirus 2 NEGATIVE NEGATIVE Final    Comment: (NOTE) SARS-CoV-2 target nucleic acids are NOT DETECTED.  The SARS-CoV-2 RNA is generally detectable in upper and lower respiratory specimens during the acute phase of infection. The lowest concentration of SARS-CoV-2 viral copies this assay can detect is 250 copies / mL. A negative result does not preclude SARS-CoV-2 infection and should not be used as the sole basis for treatment or other patient management decisions.  A negative result may occur with improper specimen collection / handling, submission of specimen other than nasopharyngeal swab, presence of viral mutation(s) within the areas targeted by this assay, and inadequate number of viral copies (<250 copies / mL). A negative result must be combined with clinical observations, patient history, and epidemiological information.  Fact Sheet for Patients:   StrictlyIdeas.no  Fact Sheet for Healthcare Providers: BankingDealers.co.za  This test is not yet approved or  cleared by the [place] FDA and has been authorized for detection and/or diagnosis of SARS-CoV-2 by FDA under an Emergency Use  Authorization (EUA).  This EUA will remain in effect (meaning this test can be used) for the duration of the COVID-19 declaration under Section 564(b)(1) of the Act, 21 U.S.C. section 360bbb-3(b)(1), unless the authorization is terminated or revoked sooner.  Performed at [facility], [address], [place], [place] [address]      Radiology Studies: DG Chest 1 View  Result Date: 09/25/2020 CLINICAL DATA:  Fall with shoulder pain EXAM: CHEST  1 VIEW COMPARISON:  September 21, 2020 FINDINGS: Two lead left chest pacemaker with leads overlying the right atrium and right ventricle. Heart size is  upper limits of normal. Moderate size hiatal hernia. No focal consolidation. No significant pleural effusion or visible pneumothorax. Aortic atherosclerosis. Anterior dislocation of the right humeral head at the glenoid. IMPRESSION: 1. Anterior dislocation of the right humeral head at the glenoid. 2. No acute cardiopulmonary abnormality. Electronically Signed   By: Dahlia Bailiff MD   On: 09/25/2020 19:12   DG Shoulder Right  Result Date: 09/26/2020 CLINICAL DATA:  Post reduction shoulder. EXAM: DG C-ARM 1-60 MIN; RIGHT SHOULDER - 2+ VIEW FLUOROSCOPY TIME:  Fluoroscopy Time:  24 seconds. Number of Acquired Spot Images: 2 COMPARISON:  None. FINDINGS: Two C-arm fluoroscopic images were obtained intraoperatively and submitted for post operative interpretation. These images demonstrate reduction of the patient's previously seen shoulder dislocation. Although evaluation is limited on these 2 images, the shoulder appears to be located. Postoperative radiographs could further evaluate if clinically indicated. Please see the performing provider's procedural report for further detail. IMPRESSION: Intraoperative fluoroscopy, as detailed above. Electronically Signed   By: Margaretha Sheffield MD   On: 09/26/2020 14:20   DG Shoulder Right  Result Date: 09/25/2020 CLINICAL DATA:  Right shoulder dislocation, attempted reduction  EXAM: RIGHT SHOULDER - 2+ VIEW COMPARISON:  None. FINDINGS: Single-view, frontal radiograph of the right shoulder demonstrates persistent anteroinferior right glenohumeral dislocation. No definite superimposed fracture. Acromioclavicular joint space appears preserved. There is diffuse subcutaneous edema within the soft tissues of the right shoulder and visualized right upper extremity. Limited evaluation of the right hemithorax is unremarkable. IMPRESSION: Persistent anteroinferior right shoulder dislocation. Electronically Signed   By: Fidela Salisbury MD   On: 09/25/2020 23:37   DG Shoulder Right  Result Date: 09/25/2020 CLINICAL DATA:  Fall with pain. EXAM: RIGHT SHOULDER - 2+ VIEW COMPARISON:  September 18, 2020. FINDINGS: Anterior dislocation of the humeral head at the glenoid. Soft tissues are unremarkable. IMPRESSION: Anterior dislocation of the humeral head. Electronically Signed   By: Dahlia Bailiff MD   On: 09/25/2020 19:08   DG Forearm Right  Result Date: 09/25/2020 CLINICAL DATA:  Fall with shoulder pain EXAM: RIGHT FOREARM - 2 VIEW COMPARISON:  September 18, 2020. FINDINGS: There is no evidence of fracture or other focal bone lesions. Soft tissues are unremarkable. IMPRESSION: Negative. Electronically Signed   By: Dahlia Bailiff MD   On: 09/25/2020 19:04   CT Head Wo Contrast  Result Date: 09/25/2020 CLINICAL DATA:  Un witnessed fall on 04/21, head laceration EXAM: CT HEAD WITHOUT CONTRAST CT CERVICAL SPINE WITHOUT CONTRAST TECHNIQUE: Multidetector CT imaging of the head and cervical spine was performed following the standard protocol without intravenous contrast. Multiplanar CT image reconstructions of the cervical spine were also generated. COMPARISON:  09/21/2020 FINDINGS: CT HEAD FINDINGS Brain: No evidence of acute large vascular territory infarction, hemorrhage, hydrocephalus, extra-axial collection or mass lesion/mass effect. Stable global parenchymal volume loss and ischemic change. Vascular:  No hyperdense vessel. Atherosclerotic calcifications of the internal carotid arteries and vertebral arteries at the skull base. Skull: Normal. Negative for fracture or focal lesion. Sinuses/Orbits: The paranasal sinuses and mastoid air cells are predominantly clear. Prior lens surgery. Other: None CT CERVICAL SPINE FINDINGS Alignment: Normal.  No evidence of traumatic listhesis. Skull base and vertebrae: No acute fracture. No primary bone lesion or focal pathologic process. Soft tissues and spinal canal: No prevertebral fluid or swelling. No visible canal hematoma. Disc levels: Stable multilevel degenerative change of the spine including disc space narrowing, uncovertebral and facet hypertrophy as well as osteophytosis most significant in the lower cervical spine  at C5-C6 and C6-C7. Upper chest: Negative. Other: None IMPRESSION: Motion degraded examination, within this context: 1. No evidence of acute intracranial pathology. 2. Stable chronic atrophic and ischemic changes compared to prior examination. 3. No evidence of acute fracture or traumatic listhesis of the cervical spine. 4. Stable multilevel degenerative change of the cervical spine. Electronically Signed   By: Dahlia Bailiff MD   On: 09/25/2020 17:02   CT Chest W Contrast  Result Date: 09/25/2020 CLINICAL DATA:  Unwitnessed fall with bruising to the right side of the body and various stages of healing. EXAM: CT CHEST, ABDOMEN, AND PELVIS WITH CONTRAST TECHNIQUE: Multidetector CT imaging of the chest, abdomen and pelvis was performed following the standard protocol during bolus administration of intravenous contrast. CONTRAST:  115mL OMNIPAQUE IOHEXOL 300 MG/ML  SOLN COMPARISON:  09/18/2020 FINDINGS: CT CHEST FINDINGS Cardiovascular: Normal heart size. No pericardial effusions. Calcification in the aorta, coronary arteries, and mitral valve annulus. Cardiac pacemaker. Normal caliber thoracic aorta with diffuse calcification. Mediastinum/Nodes: No  significant lymphadenopathy. Moderate esophageal hiatal hernia. Esophagus is decompressed. Lungs/Pleura: Small bilateral pleural effusions with basilar atelectasis. Peripheral interstitial pattern to the lungs may represent early edema. No focal consolidation. No pneumothorax. Musculoskeletal: Complete anterior dislocation of the right humeral head with respect to the glenoid, new since prior study. Large right shoulder effusion with edema in the visualized right upper arm. Edema and infiltration in the subcutaneous fat of the right axilla and right chest wall. Acute appearing nondisplaced fractures of the right twelfth rib and left seventh rib. Multiple thoracic vertebral compression deformities at T1, T2, and T4 levels are unchanged since prior study. The sternum appears intact. CT ABDOMEN PELVIS FINDINGS Hepatobiliary: Cholelithiasis without evidence of cholecystitis. No bile duct dilatation. No focal liver lesions. Pancreas: Unremarkable. No pancreatic ductal dilatation or surrounding inflammatory changes. Spleen: No splenic injury or perisplenic hematoma. Adrenals/Urinary Tract: No adrenal hemorrhage or renal injury identified. Bladder is unremarkable. Stomach/Bowel: Stomach, small bowel, and colon are not abnormally distended. Stool fills the colon. Residual contrast material in the rectosigmoid region. Vascular/Lymphatic: Prominent aortic calcification. No aneurysm. No significant lymphadenopathy. Reproductive: Prostate is unremarkable. Other: No free air or free fluid in the abdomen. Abdominal wall musculature appears intact. Mild edema in the subcutaneous fat along the right flank region, likely contusion. Musculoskeletal: Normal alignment of the lumbar spine. Diffuse degenerative change. No vertebral compression deformities. The sacrum, pelvis, and hips appear intact. IMPRESSION: 1. Complete anterior dislocation of the right humeral head with respect to the glenoid, new since prior study. Large right  shoulder effusion with edema in the visualized right upper arm. Edema and infiltration in the subcutaneous fat of the right axilla and right chest wall. 2. Acute appearing nondisplaced fractures of the right twelfth rib and left seventh rib. 3. Multiple thoracic vertebral compression deformities are unchanged since prior study. 4. Small bilateral pleural effusions with basilar atelectasis. Peripheral interstitial pattern to the lungs may represent early edema. 5. Moderate esophageal hiatal hernia. 6. Cholelithiasis without evidence of cholecystitis. 7. Mild edema in the subcutaneous fat along the right flank region, likely contusion. 8. Aortic atherosclerosis. Aortic Atherosclerosis (ICD10-I70.0). Electronically Signed   By: Lucienne Capers M.D.   On: 09/25/2020 20:47   CT Cervical Spine Wo Contrast  Result Date: 09/25/2020 CLINICAL DATA:  Un witnessed fall on 04/21, head laceration EXAM: CT HEAD WITHOUT CONTRAST CT CERVICAL SPINE WITHOUT CONTRAST TECHNIQUE: Multidetector CT imaging of the head and cervical spine was performed following the standard protocol without intravenous contrast. Multiplanar  CT image reconstructions of the cervical spine were also generated. COMPARISON:  09/21/2020 FINDINGS: CT HEAD FINDINGS Brain: No evidence of acute large vascular territory infarction, hemorrhage, hydrocephalus, extra-axial collection or mass lesion/mass effect. Stable global parenchymal volume loss and ischemic change. Vascular: No hyperdense vessel. Atherosclerotic calcifications of the internal carotid arteries and vertebral arteries at the skull base. Skull: Normal. Negative for fracture or focal lesion. Sinuses/Orbits: The paranasal sinuses and mastoid air cells are predominantly clear. Prior lens surgery. Other: None CT CERVICAL SPINE FINDINGS Alignment: Normal.  No evidence of traumatic listhesis. Skull base and vertebrae: No acute fracture. No primary bone lesion or focal pathologic process. Soft tissues and  spinal canal: No prevertebral fluid or swelling. No visible canal hematoma. Disc levels: Stable multilevel degenerative change of the spine including disc space narrowing, uncovertebral and facet hypertrophy as well as osteophytosis most significant in the lower cervical spine at C5-C6 and C6-C7. Upper chest: Negative. Other: None IMPRESSION: Motion degraded examination, within this context: 1. No evidence of acute intracranial pathology. 2. Stable chronic atrophic and ischemic changes compared to prior examination. 3. No evidence of acute fracture or traumatic listhesis of the cervical spine. 4. Stable multilevel degenerative change of the cervical spine. Electronically Signed   By: Dahlia Bailiff MD   On: 09/25/2020 17:02   CT ABDOMEN PELVIS W CONTRAST  Result Date: 09/25/2020 CLINICAL DATA:  Unwitnessed fall with bruising to the right side of the body and various stages of healing. EXAM: CT CHEST, ABDOMEN, AND PELVIS WITH CONTRAST TECHNIQUE: Multidetector CT imaging of the chest, abdomen and pelvis was performed following the standard protocol during bolus administration of intravenous contrast. CONTRAST:  198mL OMNIPAQUE IOHEXOL 300 MG/ML  SOLN COMPARISON:  09/18/2020 FINDINGS: CT CHEST FINDINGS Cardiovascular: Normal heart size. No pericardial effusions. Calcification in the aorta, coronary arteries, and mitral valve annulus. Cardiac pacemaker. Normal caliber thoracic aorta with diffuse calcification. Mediastinum/Nodes: No significant lymphadenopathy. Moderate esophageal hiatal hernia. Esophagus is decompressed. Lungs/Pleura: Small bilateral pleural effusions with basilar atelectasis. Peripheral interstitial pattern to the lungs may represent early edema. No focal consolidation. No pneumothorax. Musculoskeletal: Complete anterior dislocation of the right humeral head with respect to the glenoid, new since prior study. Large right shoulder effusion with edema in the visualized right upper arm. Edema and  infiltration in the subcutaneous fat of the right axilla and right chest wall. Acute appearing nondisplaced fractures of the right twelfth rib and left seventh rib. Multiple thoracic vertebral compression deformities at T1, T2, and T4 levels are unchanged since prior study. The sternum appears intact. CT ABDOMEN PELVIS FINDINGS Hepatobiliary: Cholelithiasis without evidence of cholecystitis. No bile duct dilatation. No focal liver lesions. Pancreas: Unremarkable. No pancreatic ductal dilatation or surrounding inflammatory changes. Spleen: No splenic injury or perisplenic hematoma. Adrenals/Urinary Tract: No adrenal hemorrhage or renal injury identified. Bladder is unremarkable. Stomach/Bowel: Stomach, small bowel, and colon are not abnormally distended. Stool fills the colon. Residual contrast material in the rectosigmoid region. Vascular/Lymphatic: Prominent aortic calcification. No aneurysm. No significant lymphadenopathy. Reproductive: Prostate is unremarkable. Other: No free air or free fluid in the abdomen. Abdominal wall musculature appears intact. Mild edema in the subcutaneous fat along the right flank region, likely contusion. Musculoskeletal: Normal alignment of the lumbar spine. Diffuse degenerative change. No vertebral compression deformities. The sacrum, pelvis, and hips appear intact. IMPRESSION: 1. Complete anterior dislocation of the right humeral head with respect to the glenoid, new since prior study. Large right shoulder effusion with edema in the visualized right upper arm.  Edema and infiltration in the subcutaneous fat of the right axilla and right chest wall. 2. Acute appearing nondisplaced fractures of the right twelfth rib and left seventh rib. 3. Multiple thoracic vertebral compression deformities are unchanged since prior study. 4. Small bilateral pleural effusions with basilar atelectasis. Peripheral interstitial pattern to the lungs may represent early edema. 5. Moderate esophageal hiatal  hernia. 6. Cholelithiasis without evidence of cholecystitis. 7. Mild edema in the subcutaneous fat along the right flank region, likely contusion. 8. Aortic atherosclerosis. Aortic Atherosclerosis (ICD10-I70.0). Electronically Signed   By: Lucienne Capers M.D.   On: 09/25/2020 20:47   DG Shoulder Right Port  Result Date: 09/26/2020 CLINICAL DATA:  The patient suffered an anterior right shoulder dislocation in a fall today. Status post reduction. EXAM: PORTABLE RIGHT SHOULDER COMPARISON:  Plain films right shoulder earlier today. FINDINGS: The shoulder is now located.  No new abnormality. IMPRESSION: Successful reduction of dislocation. Electronically Signed   By: Inge Rise M.D.   On: 09/26/2020 14:02   DG Humerus Right  Result Date: 09/25/2020 CLINICAL DATA:  Fall with pain EXAM: RIGHT HUMERUS - 2+ VIEW COMPARISON:  September 18, 2020 FINDINGS: There is no evidence of humerus fracture. Anterior dislocation of the humeral head at the glenoid soft tissues are unremarkable. IMPRESSION: Anterior shoulder dislocation. No fracture of the humerus visualized. Electronically Signed   By: Dahlia Bailiff MD   On: 09/25/2020 19:09   DG Hand Complete Right  Result Date: 09/25/2020 CLINICAL DATA:  Fall with pain EXAM: RIGHT HAND - COMPLETE 3+ VIEW COMPARISON:  None. FINDINGS: There is no evidence of fracture or dislocation. Degenerative change of the interphalangeal joints as well as the first MTP and CPC joints. Soft tissues are unremarkable. IMPRESSION: No fracture or dislocation of the right hand. Degenerative changes as above. Electronically Signed   By: Dahlia Bailiff MD   On: 09/25/2020 19:14   DG C-Arm 1-60 Min  Result Date: 09/26/2020 CLINICAL DATA:  Post reduction shoulder. EXAM: DG C-ARM 1-60 MIN; RIGHT SHOULDER - 2+ VIEW FLUOROSCOPY TIME:  Fluoroscopy Time:  24 seconds. Number of Acquired Spot Images: 2 COMPARISON:  None. FINDINGS: Two C-arm fluoroscopic images were obtained intraoperatively and  submitted for post operative interpretation. These images demonstrate reduction of the patient's previously seen shoulder dislocation. Although evaluation is limited on these 2 images, the shoulder appears to be located. Postoperative radiographs could further evaluate if clinically indicated. Please see the performing provider's procedural report for further detail. IMPRESSION: Intraoperative fluoroscopy, as detailed above. Electronically Signed   By: Margaretha Sheffield MD   On: 09/26/2020 14:20   DG Hip Unilat W or Wo Pelvis 2-3 Views Left  Result Date: 09/25/2020 CLINICAL DATA:  Fall with pain EXAM: DG HIP (WITH OR WITHOUT PELVIS) 2-3V LEFT COMPARISON:  September 18, 2020 FINDINGS: There is no evidence of hip fracture or dislocation. There is no evidence of arthropathy or other focal bone abnormality. Radiopaque contrast in the colon. Vascular calcifications. Pelvic phleboliths. IMPRESSION: Negative. Electronically Signed   By: Dahlia Bailiff MD   On: 09/25/2020 19:10    Scheduled Meds: . Chlorhexidine Gluconate Cloth  6 each Topical Daily  . feeding supplement  237 mL Oral BID BM  . furosemide  20 mg Intravenous Q12H  . levothyroxine  100 mcg Oral Daily  . pantoprazole (PROTONIX) IV  40 mg Intravenous Q24H  . QUEtiapine  25 mg Oral QHS   Continuous Infusions:   LOS: 2 days   Bonnielee Haff, MD  [facility] Pager On Start  If 7PM-7AM, please contact night-coverage 09/27/2020, 7:58 AM

## 2020-09-28 DIAGNOSIS — N179 Acute kidney failure, unspecified: Secondary | ICD-10-CM

## 2020-09-28 LAB — BASIC METABOLIC PANEL
Anion gap: 10 (ref 5–15)
BUN: 51 mg/dL — ABNORMAL HIGH (ref 8–23)
CO2: 26 mmol/L (ref 22–32)
Calcium: 8.5 mg/dL — ABNORMAL LOW (ref 8.9–10.3)
Chloride: 106 mmol/L (ref 98–111)
Creatinine, Ser: 1.84 mg/dL — ABNORMAL HIGH (ref 0.61–1.24)
GFR, Estimated: 34 mL/min — ABNORMAL LOW (ref >60)
Glucose, Bld: 84 mg/dL (ref 70–99)
Potassium: 3.7 mmol/L (ref 3.5–5.1)
Sodium: 142 mmol/L (ref 135–145)

## 2020-09-28 LAB — IRON AND TIBC
Iron: 71 ug/dL (ref 45–182)
Saturation Ratios: 27 % (ref 17.9–39.5)
TIBC: 259 ug/dL (ref 250–450)
UIBC: 188 ug/dL

## 2020-09-28 LAB — CBC
HCT: 29.5 % — ABNORMAL LOW (ref 39.0–52.0)
Hemoglobin: 9.4 g/dL — ABNORMAL LOW (ref 13.0–17.0)
MCH: 32.6 pg (ref 26.0–34.0)
MCHC: 31.9 g/dL (ref 30.0–36.0)
MCV: 102.4 fL — ABNORMAL HIGH (ref 80.0–100.0)
Platelets: 244 10*3/uL (ref 150–400)
RBC: 2.88 MIL/uL — ABNORMAL LOW (ref 4.22–5.81)
RDW: 18.9 % — ABNORMAL HIGH (ref 11.5–15.5)
WBC: 7.2 10*3/uL (ref 4.0–10.5)
nRBC: 0.3 % — ABNORMAL HIGH (ref 0.0–0.2)

## 2020-09-28 LAB — RETICULOCYTES
Immature Retic Fract: 26.6 % — ABNORMAL HIGH (ref 2.3–15.9)
RBC.: 2.84 MIL/uL — ABNORMAL LOW (ref 4.22–5.81)
Retic Count, Absolute: 183.5 10*3/uL (ref 19.0–186.0)
Retic Ct Pct: 6.5 % — ABNORMAL HIGH (ref 0.4–3.1)

## 2020-09-28 LAB — FERRITIN: Ferritin: 161 ng/mL (ref 24–336)

## 2020-09-28 LAB — T4, FREE: Free T4: 1.23 ng/dL — ABNORMAL HIGH (ref 0.61–1.12)

## 2020-09-28 MED ORDER — QUETIAPINE FUMARATE 25 MG PO TABS: 25.0000 mg | ORAL_TABLET | Freq: Every day | ORAL | 0 refills | Status: AC

## 2020-09-28 MED ORDER — POLYETHYLENE GLYCOL 3350 17 G PO PACK
17.0000 g | PACK | Freq: Every day | ORAL | Status: DC
  Administered 2020-09-28 – 2020-09-29 (×2): 17 g via ORAL
  Filled 2020-09-28 (×3): qty 1

## 2020-09-28 MED ORDER — SODIUM CHLORIDE 0.45 % IV SOLN: INTRAVENOUS | Status: AC

## 2020-09-28 MED ORDER — ENSURE ENLIVE PO LIQD: 237.0000 mL | Freq: Two times a day (BID) | ORAL | 12 refills | Status: AC

## 2020-09-28 MED ORDER — TRAMADOL HCL 50 MG PO TABS
50.0000 mg | ORAL_TABLET | Freq: Four times a day (QID) | ORAL | Status: DC | PRN
  Administered 2020-09-28: 50 mg via ORAL
  Filled 2020-09-28: qty 1

## 2020-09-28 MED ORDER — SENNA 8.6 MG PO TABS: 1.0000 | ORAL_TABLET | Freq: Every day | ORAL | 0 refills | Status: AC

## 2020-09-28 NOTE — Progress Notes (Addendum)
Patient was found in bed with gown off, arm sling off, condom cath pulled off and he had tore the dressings off his arm causing large skin tears. He was able to tell us his right should was hurting and pain medication was given. I did not want to put dressings on his arm that could tear his skin further so I wrapped them with a gauze loosely.

## 2020-09-28 NOTE — Progress Notes (Addendum)
PROGRESS NOTE    Tracy Castillo  GYI:[id number] DOB: 02-11-1925 DOA: 09/25/2020 PCP: Waldon Reining, NP    Brief Narrative:   87 y.o. male with medical history significant for complete heart block with pacemaker, dementia, hypothyroidism, glaucoma who presented to the emergency department via EMS from [facility] rehab facility due to right shoulder dislocation.  Patient was unable to provide history possibly due to underlying dementia, history was obtained from ED PA and ED medical record.  Per report, patient was reported to be admitted to the facility on 4/21 due to right shoulder dislocation and subsequent reduction.  Patient was reported to be discharged from this facility because patient was not keeping the sling on his right arm.  Patient was reported to have falling about 2 days ago and he was complaining of right shoulder right arm right flank and left hip pain.  Patient denies loss of breath, chest pain, abdominal pain at bedside. Orthopedic Surgery consulted at time of presentation   Assessment & Plan:    Anterior dislocation of right shoulder Patient was seen by the orthopedic service.  Underwent close reduction.  Seems to be stable.  Use tramadol for pain.  PT and OT.  Needs short-term rehab at skilled nursing facility.  Acute on chronic kidney disease stage IIIA BUN/creatinine 38/1.35.  Rise in creatinine was noted yesterday.  Thought to be due to poor oral intake and furosemide.  Furosemide was held.  BUN and creatinine continues to climb.  There is no evidence for volume overload at this time.  If anything he is likely dehydrated.  We will gently hydrate him.  Monitor urine output.  Recheck labs tomorrow.    Macrocytic anemia Hemoglobin stable.  Anemia panel unremarkable including B12 and folate levels.  TSH was noted to be elevated.    Complete heart block Stable at present  Esophageal hiatal hernia Continue Protonix as tolerated  Bilateral pleural effusion CT  of the chest showed Small bilateral pleural effusions with basilar atelectasis.  Does not have any respiratory symptoms.  Saturations are normal.  Hypothyroidism Levothyroxine being continued.  TSH noted to be 13.  Free T4 is pending.  Will likely need to increase the dose of his levothyroxine.  Hypoalbuminemia Albumin 2.8 Cont with supplemental nutrition as needed  Glaucoma Patient was not taking home latanoprost per med rec.  Acute Rib Fracture involving right 12th and left seventh Does not appear to be in any significant pain at this time.  Continue to monitor.  Incentive spirometry.  Multiple thoracic vertebral compression deformities These are chronic based on CT report.  Cholelithiasis Stable.  Outpatient monitoring.  History of dementia Stable.  Discussed with patient's power of attorney Aleen Sells who was living with the patient till recently when the patient had to go to skilled nursing facility.  He mentioned that patient's dementia waxes and wanes.  Whenever he is in a new environment his mentation gets worse.  Goals of care CODE STATUS was discussed with the [name] who mentioned that patient has expressed wishes for full scope of care in the past.  PT does understand is that if patient's dementia continues to get worse then it may be more prudent to start talking about limiting aggressive care and interventions.  I would recommend that palliative care see the patient when he is at the skilled nursing facility.  ADDENDUM: Another discussion with [name] today who discussed CODE STATUS with patient yesterday when he came to visit him.  Patient expressed  wish not to undergo any heroic measures.  No life support.  No CPR.  We will change him to DNR.  DVT prophylaxis: SCD's Code Status: DNR effective 4/20 Family Communication: Pt in room, family not at bedside Disposition: Most likely will need to go to skilled nursing facility.  Status is: Inpatient  Remains  inpatient appropriate because:IV treatments appropriate due to intensity of illness or inability to take PO and Inpatient level of care appropriate due to severity of illness   Dispo: The patient is from: Home              Anticipated d/c is to: Home              Patient currently is not medically stable to d/c.   Difficult to place patient Yes    Consultants:   Orthopedics  Procedures:   Closed reduction of right shoulder dislocation  Antimicrobials: Anti-infectives (From admission, onward)   None      Subjective: Noted to be pleasantly confused.  No discomfort noted.  He denies any complaints at this time.  Objective: Vitals:   09/26/20 2201 09/27/20 0421 09/27/20 2134 09/28/20 0524  BP: (!) 116/98 136/65 (!) 100/35 (!) 101/55  Pulse: 76 80 88 83  Resp: 19 19 19 19   Temp: 98.1 F (36.7 C) 98.2 F (36.8 C) (!) 97.5 F (36.4 C) 97.6 F (36.4 C)  TempSrc:      SpO2: 99% 96% 97% 94%  Weight:      Height:        Intake/Output Summary (Last 24 hours) at 09/28/2020 0951 Last data filed at 09/28/2020 0700 Gross per 24 hour  Intake 670 ml  Output 300 ml  Net 370 ml   Filed Weights   09/25/20 1442 09/26/20 0152  Weight: 72.6 kg 64.9 kg    Examination:  General appearance: Awake alert.  In no distress.  Pleasantly confused Resp: Clear to auscultation bilaterally.  Normal effort Cardio: S1-S2 is normal regular.  No S3-S4.  No rubs murmurs or bruit GI: Abdomen is soft.  Nontender nondistended.  Bowel sounds are present normal.  No masses organomegaly Extremities: Right arm noted to be in a sling today.  Good radial pulses. Neurologic:  No focal neurological deficits.      Data Reviewed: I have personally reviewed following labs and imaging studies  CBC: Recent Labs  Lab 09/25/20 1528 09/26/20 0623 09/27/20 0513 09/28/20 0436  WBC 9.8 8.9 8.2 7.2  NEUTROABS 6.5  --   --   --   HGB 9.4* 9.1* 9.5* 9.4*  HCT 29.1* 28.7* 29.9* 29.5*  MCV 100.7* 101.8*  103.1* 102.4*  PLT 236 238 250 355   Basic Metabolic Panel: Recent Labs  Lab 09/25/20 1528 09/26/20 0623 09/27/20 0513 09/28/20 0436  NA 137 140 139 142  K 3.6 3.5 3.7 3.7  CL 107 109 103 106  CO2 23 24 26 26   GLUCOSE 122* 87 82 84  BUN 38* 33* 39* 51*  CREATININE 1.35* 1.25* 1.52* 1.84*  CALCIUM 8.4* 8.3* 8.3* 8.5*  MG  --  1.9 1.9  --   PHOS  --  3.3  --   --    GFR: Estimated Creatinine Clearance: 22.6 mL/min (A) (by C-G formula based on SCr of 1.84 mg/dL (H)). Liver Function Tests: Recent Labs  Lab 09/25/20 1528 09/26/20 0623 09/27/20 0513  AST 48* 43* 46*  ALT 34 33 30  ALKPHOS 59 52 49  BILITOT 1.7* 2.1* 2.2*  PROT 5.6* 5.4* 5.4*  ALBUMIN 2.8* 2.7* 2.7*   Coagulation Profile: Recent Labs  Lab 09/26/20 0623  INR 1.1   Anemia Panel: Recent Labs    09/26/20 0623 09/28/20 0436  VITAMINB12 1,191*  --   FOLATE 19.8  --   FERRITIN  --  161  TIBC  --  259  IRON  --  71  RETICCTPCT  --  6.5*     Recent Results (from the past 240 hour(s))  SARS CORONAVIRUS 2 (TAT 6-24 HRS) Nasopharyngeal Nasopharyngeal Swab     Status: None   Collection Time: 09/25/20  9:23 PM   Specimen: Nasopharyngeal Swab  Result Value Ref Range Status   SARS Coronavirus 2 NEGATIVE NEGATIVE Final    Comment: (NOTE) SARS-CoV-2 target nucleic acids are NOT DETECTED.  The SARS-CoV-2 RNA is generally detectable in upper and lower respiratory specimens during the acute phase of infection. Negative results do not preclude SARS-CoV-2 infection, do not rule out co-infections with other pathogens, and should not be used as the sole basis for treatment or other patient management decisions. Negative results must be combined with clinical observations, patient history, and epidemiological information. The expected result is Negative.  Fact Sheet for Patients: SugarRoll.be  Fact Sheet for Healthcare  Providers: https://www.woods-mathews.com/  This test is not yet approved or cleared by the [place] FDA and  has been authorized for detection and/or diagnosis of SARS-CoV-2 by FDA under an Emergency Use Authorization (EUA). This EUA will remain  in effect (meaning this test can be used) for the duration of the COVID-19 declaration under Se ction 564(b)(1) of the Act, 21 U.S.C. section 360bbb-3(b)(1), unless the authorization is terminated or revoked sooner.  Performed at [facility], [place] [address] [place], [place] [address]   MRSA PCR Screening     Status: None   Collection Time: 09/26/20  1:45 AM   Specimen: Nasal Mucosa; Nasopharyngeal  Result Value Ref Range Status   MRSA by PCR NEGATIVE NEGATIVE Final    Comment:        The GeneXpert MRSA Assay (FDA approved for NASAL specimens only), is one component of a comprehensive MRSA colonization surveillance program. It is not intended to diagnose MRSA infection nor to guide or monitor treatment for MRSA infections. Performed at [facility], [address], [place], [place] [address]   SARS Coronavirus 2 by RT PCR (hospital order, performed in [facility] hospital lab) Nasopharyngeal Nasopharyngeal Swab     Status: None   Collection Time: 09/26/20 10:01 AM   Specimen: Nasopharyngeal Swab  Result Value Ref Range Status   SARS Coronavirus 2 NEGATIVE NEGATIVE Final    Comment: (NOTE) SARS-CoV-2 target nucleic acids are NOT DETECTED.  The SARS-CoV-2 RNA is generally detectable in upper and lower respiratory specimens during the acute phase of infection. The lowest concentration of SARS-CoV-2 viral copies this assay can detect is 250 copies / mL. A negative result does not preclude SARS-CoV-2 infection and should not be used as the sole basis for treatment or other patient management decisions.  A negative result may occur with improper specimen collection / handling, submission of specimen other than  nasopharyngeal swab, presence of viral mutation(s) within the areas targeted by this assay, and inadequate number of viral copies (<250 copies / mL). A negative result must be combined with clinical observations, patient history, and epidemiological information.  Fact Sheet for Patients:   StrictlyIdeas.no  Fact Sheet for Healthcare Providers: BankingDealers.co.za  This test is not  yet approved or  cleared by the [place] and has been authorized for detection and/or diagnosis of SARS-CoV-2 by FDA under an Emergency Use Authorization (EUA).  This EUA will remain in effect (meaning this test can be used) for the duration of the COVID-19 declaration under Section 564(b)(1) of the Act, 21 U.S.C. section 360bbb-3(b)(1), unless the authorization is terminated or revoked sooner.  Performed at [facility], [address], [place], [place] [address]      Radiology Studies: DG Shoulder Right  Result Date: 09/26/2020 CLINICAL DATA:  Post reduction shoulder. EXAM: DG C-ARM 1-60 MIN; RIGHT SHOULDER - 2+ VIEW FLUOROSCOPY TIME:  Fluoroscopy Time:  24 seconds. Number of Acquired Spot Images: 2 COMPARISON:  None. FINDINGS: Two C-arm fluoroscopic images were obtained intraoperatively and submitted for post operative interpretation. These images demonstrate reduction of the patient's previously seen shoulder dislocation. Although evaluation is limited on these 2 images, the shoulder appears to be located. Postoperative radiographs could further evaluate if clinically indicated. Please see the performing provider's procedural report for further detail. IMPRESSION: Intraoperative fluoroscopy, as detailed above. Electronically Signed   By: Margaretha Sheffield MD   On: 09/26/2020 14:20   DG Shoulder Right Port  Result Date: 09/27/2020 CLINICAL DATA:  Anterior shoulder dislocation post reduction EXAM: PORTABLE RIGHT SHOULDER COMPARISON:  09/25/2020 FINDINGS:  The glenohumeral joint is intact. Shoulder is located. No fracture identified. IMPRESSION: Intact glenohumeral joint post reduction. Electronically Signed   By: Suzy Bouchard M.D.   On: 09/27/2020 11:23   DG Shoulder Right Port  Result Date: 09/26/2020 CLINICAL DATA:  The patient suffered an anterior right shoulder dislocation in a fall today. Status post reduction. EXAM: PORTABLE RIGHT SHOULDER COMPARISON:  Plain films right shoulder earlier today. FINDINGS: The shoulder is now located.  No new abnormality. IMPRESSION: Successful reduction of dislocation. Electronically Signed   By: Inge Rise M.D.   On: 09/26/2020 14:02   DG C-Arm 1-60 Min  Result Date: 09/26/2020 CLINICAL DATA:  Post reduction shoulder. EXAM: DG C-ARM 1-60 MIN; RIGHT SHOULDER - 2+ VIEW FLUOROSCOPY TIME:  Fluoroscopy Time:  24 seconds. Number of Acquired Spot Images: 2 COMPARISON:  None. FINDINGS: Two C-arm fluoroscopic images were obtained intraoperatively and submitted for post operative interpretation. These images demonstrate reduction of the patient's previously seen shoulder dislocation. Although evaluation is limited on these 2 images, the shoulder appears to be located. Postoperative radiographs could further evaluate if clinically indicated. Please see the performing provider's procedural report for further detail. IMPRESSION: Intraoperative fluoroscopy, as detailed above. Electronically Signed   By: Margaretha Sheffield MD   On: 09/26/2020 14:20    Scheduled Meds: . Chlorhexidine Gluconate Cloth  6 each Topical Daily  . feeding supplement  237 mL Oral BID BM  . levothyroxine  100 mcg Oral Daily  . pantoprazole (PROTONIX) IV  40 mg Intravenous Q24H  . polyethylene glycol  17 g Oral Daily  . QUEtiapine  25 mg Oral QHS   Continuous Infusions: . sodium chloride 75 mL/hr at 09/28/20 0931     LOS: 3 days   Bonnielee Haff, MD [facility] Pager On Amion  If 7PM-7AM, please contact  night-coverage 09/28/2020, 9:51 AM

## 2020-09-28 NOTE — TOC Progression Note (Signed)
CSW informed pt will possibly d/c tomorrow. The plan is for pt to return to [facility] for SNF. CSW spoke with Mardene Celeste at [facility] who states pt will need COVID test completed and she will need the discharge summary before 3pm. TOC to follow.

## 2020-09-29 LAB — RENAL FUNCTION PANEL
Albumin: 2.6 g/dL — ABNORMAL LOW (ref 3.5–5.0)
Anion gap: 7 (ref 5–15)
BUN: 44 mg/dL — ABNORMAL HIGH (ref 8–23)
CO2: 25 mmol/L (ref 22–32)
Calcium: 7.9 mg/dL — ABNORMAL LOW (ref 8.9–10.3)
Chloride: 107 mmol/L (ref 98–111)
Creatinine, Ser: 1.48 mg/dL — ABNORMAL HIGH (ref 0.61–1.24)
GFR, Estimated: 44 mL/min — ABNORMAL LOW (ref >60)
Glucose, Bld: 101 mg/dL — ABNORMAL HIGH (ref 70–99)
Phosphorus: 3.1 mg/dL (ref 2.5–4.6)
Potassium: 3.9 mmol/L (ref 3.5–5.1)
Sodium: 139 mmol/L (ref 135–145)

## 2020-09-29 LAB — CBC
HCT: 28 % — ABNORMAL LOW (ref 39.0–52.0)
HCT: 28.8 % — ABNORMAL LOW (ref 39.0–52.0)
Hemoglobin: 8.7 g/dL — ABNORMAL LOW (ref 13.0–17.0)
Hemoglobin: 9.3 g/dL — ABNORMAL LOW (ref 13.0–17.0)
MCH: 32.3 pg (ref 26.0–34.0)
MCH: 33.6 pg (ref 26.0–34.0)
MCHC: 31.1 g/dL (ref 30.0–36.0)
MCHC: 32.3 g/dL (ref 30.0–36.0)
MCV: 104.1 fL — ABNORMAL HIGH (ref 80.0–100.0)
MCV: 104 fL — ABNORMAL HIGH (ref 80.0–100.0)
Platelets: 207 10*3/uL (ref 150–400)
Platelets: 228 10*3/uL (ref 150–400)
RBC: 2.69 MIL/uL — ABNORMAL LOW (ref 4.22–5.81)
RBC: 2.77 MIL/uL — ABNORMAL LOW (ref 4.22–5.81)
RDW: 18.7 % — ABNORMAL HIGH (ref 11.5–15.5)
RDW: 18.9 % — ABNORMAL HIGH (ref 11.5–15.5)
WBC: 7.2 10*3/uL (ref 4.0–10.5)
WBC: 7.7 10*3/uL (ref 4.0–10.5)
nRBC: 0 % (ref 0.0–0.2)
nRBC: 0 % (ref 0.0–0.2)

## 2020-09-29 LAB — BASIC METABOLIC PANEL
Anion gap: 9 (ref 5–15)
BUN: 45 mg/dL — ABNORMAL HIGH (ref 8–23)
CO2: 23 mmol/L (ref 22–32)
Calcium: 8 mg/dL — ABNORMAL LOW (ref 8.9–10.3)
Chloride: 107 mmol/L (ref 98–111)
Creatinine, Ser: 1.44 mg/dL — ABNORMAL HIGH (ref 0.61–1.24)
GFR, Estimated: 45 mL/min — ABNORMAL LOW (ref >60)
Glucose, Bld: 78 mg/dL (ref 70–99)
Potassium: 3.7 mmol/L (ref 3.5–5.1)
Sodium: 139 mmol/L (ref 135–145)

## 2020-09-29 LAB — CORTISOL: Cortisol, Plasma: 20 ug/dL

## 2020-09-29 MED ORDER — POLYETHYLENE GLYCOL 3350 17 G PO PACK: 17.0000 g | PACK | Freq: Every day | ORAL | 0 refills | Status: AC

## 2020-09-29 MED ORDER — SODIUM CHLORIDE 0.9 % IV SOLN: INTRAVENOUS | Status: DC

## 2020-09-29 MED ORDER — TRAMADOL HCL 50 MG PO TABS: 50.0000 mg | ORAL_TABLET | Freq: Four times a day (QID) | ORAL | 0 refills | Status: AC | PRN

## 2020-09-29 NOTE — Progress Notes (Addendum)
MD Memon notified of BP 106/24 and HR of 40 for afternoon vitals. MD ordered manual BP. Manual BP 108/38. MD notified.

## 2020-09-29 NOTE — Care Management Important Message (Addendum)
Important Message  Patient Details  Name: Tracy Castillo MRN: [id number] Date of Birth: Apr 25, 1925   Medicare Important Message Given:  Yes  Given at 11:00am   Tommy Medal 09/29/2020, 12:12 PM

## 2020-09-29 NOTE — Discharge Summary (Signed)
Physician Discharge Summary  Tracy Castillo [id number] DOB: 09/04/1924 DOA: 09/25/2020  PCP: Waldon Reining, NP  Admit date: 09/25/2020 Discharge date: 09/29/2020  Admitted From: SNF Disposition:  SNF  Recommendations for Outpatient Follow-up:  1. Follow up with PCP in 1-2 weeks 2. Please obtain BMP/CBC in one week 3. NWB on right upper extremity, should be in sling 4. Follow up with Dr. Amedeo Kinsman in 2 weeks for skin check and xray 5. Repeat thyroid studies in 3 weeks   Discharge Condition: stable CODE STATUS:DNR Diet recommendation: dysphagia 1 diet with nectar thick liquids  Brief/Interim Summary: 87 y.o.malewith medical history significant forcomplete heart block with pacemaker, dementia, hypothyroidism, glaucoma who presented to the emergency department via EMS from [facility] rehab facility due to right shoulder dislocation. Patient was unable to provide history possibly due to underlying dementia, history was obtained from ED PA and ED medical record. Per report, patient was reported to be admitted to the facility on 4/21 due to right shoulder dislocation and subsequent reduction. Patient was reported to be discharged from this facility because patient was not keeping the sling on his right arm. Patient was reported to have falling about 2 days ago and he was complaining of right shoulder right arm right flank and left hip pain. Patient denies loss of breath, chest pain, abdominal pain at bedside. Orthopedic Surgery consulted at time of presentation  Discharge Diagnoses:  Active Problems:   Complete heart block (HCC)   Anterior dislocation of right shoulder   Esophageal hiatal hernia   Bilateral pleural effusion   Hypothyroidism   Glaucoma   Macrocytic anemia   Hypoalbuminemia   Chronic kidney disease  Anterior dislocation of right shoulder Patient was seen by the orthopedic service.  Underwent close reduction.  Seems to be stable.  Use tramadol for pain.  PT  and OT.  Needs short-term rehab at skilled nursing facility. He is NWB on right shoulder. Follow up with ortho in 2 weeks  Acute on chronic kidney disease stage IIIA BUN/creatinine 38/1.35.  Rise in creatinine thought to be due to poor oral intake and furosemide.  Furosemide was held.  BUN and creatinine continues to climb.  There is no evidence for volume overload at this time.  If anything he is likely dehydrated.  He was hydrated with IV fluids with improvement in renal function back to baseline.    Macrocytic anemia Hemoglobin stable.  Anemia panel unremarkable including B12 and folate levels.  TSH was noted to be elevated.    Complete heart block Stable at present  Esophageal hiatal hernia Continue Protonix as tolerated  Bilateral pleural effusion CT of the chest showedSmall bilateral pleural effusions with basilar atelectasis.  Does not have any respiratory symptoms.  Saturations are normal.  Hypothyroidism Levothyroxine being continued.  TSH noted to be 13.  Free T4 mildly elevated at 1.23. At this point, will not make any changes to levothyroxine dose and would recommend rechecking thyroid studies in 3 weeks once patient's acute issues have settled down  Hypoalbuminemia Albumin 2.8 Cont with supplemental nutrition as needed  Glaucoma Patient was not taking home latanoprost per med rec.  Acute Rib Fracture involving right 12th and left seventh Does not appear to be in any significant pain at this time.  Continue to monitor.  Incentive spirometry.  Multiple thoracic vertebral compression deformities These are chronic based on CT report.  Cholelithiasis Stable.  Outpatient monitoring.  History of dementia Stable.   On aricept  Goals of care Dr.  [place] Pink discussed code status with patient's POA [name], and he relayed that patient would not want to receive heroic measures, no life support, no CPR. DNR order was placed.  Discharge  Instructions  Discharge Instructions    Diet - low sodium heart healthy   Complete by: As directed    Increase activity slowly   Complete by: As directed    No wound care   Complete by: As directed      Allergies as of 09/29/2020   No Known Allergies     Medication List    STOP taking these medications   latanoprost 0.005 % ophthalmic solution Commonly known as: XALATAN   OMEGA-3 FISH OIL PO   VITAMIN B PLUS+ PO     TAKE these medications   acetaminophen 325 MG tablet Commonly known as: TYLENOL Take 650 mg by mouth every 6 (six) hours as needed for mild pain. What changed: Another medication with the same name was removed. Continue taking this medication, and follow the directions you see here.   aspirin 81 MG EC tablet Take 81 mg by mouth daily.   donepezil 5 MG tablet Commonly known as: ARICEPT Take 5 mg by mouth at bedtime.   dorzolamide 2 % ophthalmic solution Commonly known as: TRUSOPT Place 1 drop into the left eye 3 (three) times daily.   feeding supplement Liqd Take 237 mLs by mouth 2 (two) times daily between meals.   ferrous sulfate 325 (65 FE) MG tablet Take 1 tablet by mouth daily.   levothyroxine 100 MCG tablet Commonly known as: SYNTHROID Take 100 mcg by mouth daily. What changed: Another medication with the same name was removed. Continue taking this medication, and follow the directions you see here.   polyethylene glycol 17 g packet Commonly known as: MIRALAX / GLYCOLAX Take 17 g by mouth daily. Start taking on: September 30, 2020   QUEtiapine 25 MG tablet Commonly known as: SEROQUEL Take 1 tablet (25 mg total) by mouth at bedtime.   senna 8.6 MG Tabs tablet Commonly known as: SENOKOT Take 1 tablet (8.6 mg total) by mouth at bedtime. What changed:   when to take this  reasons to take this   traMADol 50 MG tablet Commonly known as: ULTRAM Take 1 tablet (50 mg total) by mouth every 6 (six) hours as needed for moderate pain.        Follow-up Information    Mordecai Rasmussen, MD. Schedule an appointment as soon as possible for a visit in 2 week(s).   Specialties: Orthopedic Surgery, Sports Medicine Contact information: [place]. [address] [place] [address] [phone]              No Known Allergies  Consultations:  Orthopedics   Procedures/Studies: DG Chest 1 View  Result Date: 09/25/2020 CLINICAL DATA:  Fall with shoulder pain EXAM: CHEST  1 VIEW COMPARISON:  September 21, 2020 FINDINGS: Two lead left chest pacemaker with leads overlying the right atrium and right ventricle. Heart size is upper limits of normal. Moderate size hiatal hernia. No focal consolidation. No significant pleural effusion or visible pneumothorax. Aortic atherosclerosis. Anterior dislocation of the right humeral head at the glenoid. IMPRESSION: 1. Anterior dislocation of the right humeral head at the glenoid. 2. No acute cardiopulmonary abnormality. Electronically Signed   By: Dahlia Bailiff MD   On: 09/25/2020 19:12   DG Shoulder Right  Result Date: 09/26/2020 CLINICAL DATA:  Post reduction shoulder. EXAM: DG C-ARM 1-60 MIN; RIGHT SHOULDER -  2+ VIEW FLUOROSCOPY TIME:  Fluoroscopy Time:  24 seconds. Number of Acquired Spot Images: 2 COMPARISON:  None. FINDINGS: Two C-arm fluoroscopic images were obtained intraoperatively and submitted for post operative interpretation. These images demonstrate reduction of the patient's previously seen shoulder dislocation. Although evaluation is limited on these 2 images, the shoulder appears to be located. Postoperative radiographs could further evaluate if clinically indicated. Please see the performing provider's procedural report for further detail. IMPRESSION: Intraoperative fluoroscopy, as detailed above. Electronically Signed   By: Margaretha Sheffield MD   On: 09/26/2020 14:20   DG Shoulder Right  Result Date: 09/25/2020 CLINICAL DATA:  Right shoulder dislocation, attempted reduction EXAM: RIGHT SHOULDER -  2+ VIEW COMPARISON:  None. FINDINGS: Single-view, frontal radiograph of the right shoulder demonstrates persistent anteroinferior right glenohumeral dislocation. No definite superimposed fracture. Acromioclavicular joint space appears preserved. There is diffuse subcutaneous edema within the soft tissues of the right shoulder and visualized right upper extremity. Limited evaluation of the right hemithorax is unremarkable. IMPRESSION: Persistent anteroinferior right shoulder dislocation. Electronically Signed   By: Fidela Salisbury MD   On: 09/25/2020 23:37   DG Shoulder Right  Result Date: 09/25/2020 CLINICAL DATA:  Fall with pain. EXAM: RIGHT SHOULDER - 2+ VIEW COMPARISON:  September 18, 2020. FINDINGS: Anterior dislocation of the humeral head at the glenoid. Soft tissues are unremarkable. IMPRESSION: Anterior dislocation of the humeral head. Electronically Signed   By: Dahlia Bailiff MD   On: 09/25/2020 19:08   DG Forearm Right  Result Date: 09/25/2020 CLINICAL DATA:  Fall with shoulder pain EXAM: RIGHT FOREARM - 2 VIEW COMPARISON:  September 18, 2020. FINDINGS: There is no evidence of fracture or other focal bone lesions. Soft tissues are unremarkable. IMPRESSION: Negative. Electronically Signed   By: Dahlia Bailiff MD   On: 09/25/2020 19:04   CT Head Wo Contrast  Result Date: 09/25/2020 CLINICAL DATA:  Un witnessed fall on 04/21, head laceration EXAM: CT HEAD WITHOUT CONTRAST CT CERVICAL SPINE WITHOUT CONTRAST TECHNIQUE: Multidetector CT imaging of the head and cervical spine was performed following the standard protocol without intravenous contrast. Multiplanar CT image reconstructions of the cervical spine were also generated. COMPARISON:  09/21/2020 FINDINGS: CT HEAD FINDINGS Brain: No evidence of acute large vascular territory infarction, hemorrhage, hydrocephalus, extra-axial collection or mass lesion/mass effect. Stable global parenchymal volume loss and ischemic change. Vascular: No hyperdense vessel.  Atherosclerotic calcifications of the internal carotid arteries and vertebral arteries at the skull base. Skull: Normal. Negative for fracture or focal lesion. Sinuses/Orbits: The paranasal sinuses and mastoid air cells are predominantly clear. Prior lens surgery. Other: None CT CERVICAL SPINE FINDINGS Alignment: Normal.  No evidence of traumatic listhesis. Skull base and vertebrae: No acute fracture. No primary bone lesion or focal pathologic process. Soft tissues and spinal canal: No prevertebral fluid or swelling. No visible canal hematoma. Disc levels: Stable multilevel degenerative change of the spine including disc space narrowing, uncovertebral and facet hypertrophy as well as osteophytosis most significant in the lower cervical spine at C5-C6 and C6-C7. Upper chest: Negative. Other: None IMPRESSION: Motion degraded examination, within this context: 1. No evidence of acute intracranial pathology. 2. Stable chronic atrophic and ischemic changes compared to prior examination. 3. No evidence of acute fracture or traumatic listhesis of the cervical spine. 4. Stable multilevel degenerative change of the cervical spine. Electronically Signed   By: Dahlia Bailiff MD   On: 09/25/2020 17:02   CT Chest W Contrast  Result Date: 09/25/2020 CLINICAL DATA:  Unwitnessed  fall with bruising to the right side of the body and various stages of healing. EXAM: CT CHEST, ABDOMEN, AND PELVIS WITH CONTRAST TECHNIQUE: Multidetector CT imaging of the chest, abdomen and pelvis was performed following the standard protocol during bolus administration of intravenous contrast. CONTRAST:  176mL OMNIPAQUE IOHEXOL 300 MG/ML  SOLN COMPARISON:  09/18/2020 FINDINGS: CT CHEST FINDINGS Cardiovascular: Normal heart size. No pericardial effusions. Calcification in the aorta, coronary arteries, and mitral valve annulus. Cardiac pacemaker. Normal caliber thoracic aorta with diffuse calcification. Mediastinum/Nodes: No significant lymphadenopathy.  Moderate esophageal hiatal hernia. Esophagus is decompressed. Lungs/Pleura: Small bilateral pleural effusions with basilar atelectasis. Peripheral interstitial pattern to the lungs may represent early edema. No focal consolidation. No pneumothorax. Musculoskeletal: Complete anterior dislocation of the right humeral head with respect to the glenoid, new since prior study. Large right shoulder effusion with edema in the visualized right upper arm. Edema and infiltration in the subcutaneous fat of the right axilla and right chest wall. Acute appearing nondisplaced fractures of the right twelfth rib and left seventh rib. Multiple thoracic vertebral compression deformities at T1, T2, and T4 levels are unchanged since prior study. The sternum appears intact. CT ABDOMEN PELVIS FINDINGS Hepatobiliary: Cholelithiasis without evidence of cholecystitis. No bile duct dilatation. No focal liver lesions. Pancreas: Unremarkable. No pancreatic ductal dilatation or surrounding inflammatory changes. Spleen: No splenic injury or perisplenic hematoma. Adrenals/Urinary Tract: No adrenal hemorrhage or renal injury identified. Bladder is unremarkable. Stomach/Bowel: Stomach, small bowel, and colon are not abnormally distended. Stool fills the colon. Residual contrast material in the rectosigmoid region. Vascular/Lymphatic: Prominent aortic calcification. No aneurysm. No significant lymphadenopathy. Reproductive: Prostate is unremarkable. Other: No free air or free fluid in the abdomen. Abdominal wall musculature appears intact. Mild edema in the subcutaneous fat along the right flank region, likely contusion. Musculoskeletal: Normal alignment of the lumbar spine. Diffuse degenerative change. No vertebral compression deformities. The sacrum, pelvis, and hips appear intact. IMPRESSION: 1. Complete anterior dislocation of the right humeral head with respect to the glenoid, new since prior study. Large right shoulder effusion with edema in  the visualized right upper arm. Edema and infiltration in the subcutaneous fat of the right axilla and right chest wall. 2. Acute appearing nondisplaced fractures of the right twelfth rib and left seventh rib. 3. Multiple thoracic vertebral compression deformities are unchanged since prior study. 4. Small bilateral pleural effusions with basilar atelectasis. Peripheral interstitial pattern to the lungs may represent early edema. 5. Moderate esophageal hiatal hernia. 6. Cholelithiasis without evidence of cholecystitis. 7. Mild edema in the subcutaneous fat along the right flank region, likely contusion. 8. Aortic atherosclerosis. Aortic Atherosclerosis (ICD10-I70.0). Electronically Signed   By: Lucienne Capers M.D.   On: 09/25/2020 20:47   CT Cervical Spine Wo Contrast  Result Date: 09/25/2020 CLINICAL DATA:  Un witnessed fall on 04/21, head laceration EXAM: CT HEAD WITHOUT CONTRAST CT CERVICAL SPINE WITHOUT CONTRAST TECHNIQUE: Multidetector CT imaging of the head and cervical spine was performed following the standard protocol without intravenous contrast. Multiplanar CT image reconstructions of the cervical spine were also generated. COMPARISON:  09/21/2020 FINDINGS: CT HEAD FINDINGS Brain: No evidence of acute large vascular territory infarction, hemorrhage, hydrocephalus, extra-axial collection or mass lesion/mass effect. Stable global parenchymal volume loss and ischemic change. Vascular: No hyperdense vessel. Atherosclerotic calcifications of the internal carotid arteries and vertebral arteries at the skull base. Skull: Normal. Negative for fracture or focal lesion. Sinuses/Orbits: The paranasal sinuses and mastoid air cells are predominantly clear. Prior lens surgery. Other: None  CT CERVICAL SPINE FINDINGS Alignment: Normal.  No evidence of traumatic listhesis. Skull base and vertebrae: No acute fracture. No primary bone lesion or focal pathologic process. Soft tissues and spinal canal: No prevertebral  fluid or swelling. No visible canal hematoma. Disc levels: Stable multilevel degenerative change of the spine including disc space narrowing, uncovertebral and facet hypertrophy as well as osteophytosis most significant in the lower cervical spine at C5-C6 and C6-C7. Upper chest: Negative. Other: None IMPRESSION: Motion degraded examination, within this context: 1. No evidence of acute intracranial pathology. 2. Stable chronic atrophic and ischemic changes compared to prior examination. 3. No evidence of acute fracture or traumatic listhesis of the cervical spine. 4. Stable multilevel degenerative change of the cervical spine. Electronically Signed   By: Maudry MayhewJeffrey  Waltz MD   On: 09/25/2020 17:02   CT ABDOMEN PELVIS W CONTRAST  Result Date: 09/25/2020 CLINICAL DATA:  Unwitnessed fall with bruising to the right side of the body and various stages of healing. EXAM: CT CHEST, ABDOMEN, AND PELVIS WITH CONTRAST TECHNIQUE: Multidetector CT imaging of the chest, abdomen and pelvis was performed following the standard protocol during bolus administration of intravenous contrast. CONTRAST:  100mL OMNIPAQUE IOHEXOL 300 MG/ML  SOLN COMPARISON:  09/18/2020 FINDINGS: CT CHEST FINDINGS Cardiovascular: Normal heart size. No pericardial effusions. Calcification in the aorta, coronary arteries, and mitral valve annulus. Cardiac pacemaker. Normal caliber thoracic aorta with diffuse calcification. Mediastinum/Nodes: No significant lymphadenopathy. Moderate esophageal hiatal hernia. Esophagus is decompressed. Lungs/Pleura: Small bilateral pleural effusions with basilar atelectasis. Peripheral interstitial pattern to the lungs may represent early edema. No focal consolidation. No pneumothorax. Musculoskeletal: Complete anterior dislocation of the right humeral head with respect to the glenoid, new since prior study. Large right shoulder effusion with edema in the visualized right upper arm. Edema and infiltration in the subcutaneous  fat of the right axilla and right chest wall. Acute appearing nondisplaced fractures of the right twelfth rib and left seventh rib. Multiple thoracic vertebral compression deformities at T1, T2, and T4 levels are unchanged since prior study. The sternum appears intact. CT ABDOMEN PELVIS FINDINGS Hepatobiliary: Cholelithiasis without evidence of cholecystitis. No bile duct dilatation. No focal liver lesions. Pancreas: Unremarkable. No pancreatic ductal dilatation or surrounding inflammatory changes. Spleen: No splenic injury or perisplenic hematoma. Adrenals/Urinary Tract: No adrenal hemorrhage or renal injury identified. Bladder is unremarkable. Stomach/Bowel: Stomach, small bowel, and colon are not abnormally distended. Stool fills the colon. Residual contrast material in the rectosigmoid region. Vascular/Lymphatic: Prominent aortic calcification. No aneurysm. No significant lymphadenopathy. Reproductive: Prostate is unremarkable. Other: No free air or free fluid in the abdomen. Abdominal wall musculature appears intact. Mild edema in the subcutaneous fat along the right flank region, likely contusion. Musculoskeletal: Normal alignment of the lumbar spine. Diffuse degenerative change. No vertebral compression deformities. The sacrum, pelvis, and hips appear intact. IMPRESSION: 1. Complete anterior dislocation of the right humeral head with respect to the glenoid, new since prior study. Large right shoulder effusion with edema in the visualized right upper arm. Edema and infiltration in the subcutaneous fat of the right axilla and right chest wall. 2. Acute appearing nondisplaced fractures of the right twelfth rib and left seventh rib. 3. Multiple thoracic vertebral compression deformities are unchanged since prior study. 4. Small bilateral pleural effusions with basilar atelectasis. Peripheral interstitial pattern to the lungs may represent early edema. 5. Moderate esophageal hiatal hernia. 6. Cholelithiasis  without evidence of cholecystitis. 7. Mild edema in the subcutaneous fat along the right flank region, likely contusion.  8. Aortic atherosclerosis. Aortic Atherosclerosis (ICD10-I70.0). Electronically Signed   By: Lucienne Capers M.D.   On: 09/25/2020 20:47   DG Shoulder Right Port  Result Date: 09/27/2020 CLINICAL DATA:  Anterior shoulder dislocation post reduction EXAM: PORTABLE RIGHT SHOULDER COMPARISON:  09/25/2020 FINDINGS: The glenohumeral joint is intact. Shoulder is located. No fracture identified. IMPRESSION: Intact glenohumeral joint post reduction. Electronically Signed   By: Suzy Bouchard M.D.   On: 09/27/2020 11:23   DG Shoulder Right Port  Result Date: 09/26/2020 CLINICAL DATA:  The patient suffered an anterior right shoulder dislocation in a fall today. Status post reduction. EXAM: PORTABLE RIGHT SHOULDER COMPARISON:  Plain films right shoulder earlier today. FINDINGS: The shoulder is now located.  No new abnormality. IMPRESSION: Successful reduction of dislocation. Electronically Signed   By: Inge Rise M.D.   On: 09/26/2020 14:02   DG Humerus Right  Result Date: 09/25/2020 CLINICAL DATA:  Fall with pain EXAM: RIGHT HUMERUS - 2+ VIEW COMPARISON:  September 18, 2020 FINDINGS: There is no evidence of humerus fracture. Anterior dislocation of the humeral head at the glenoid soft tissues are unremarkable. IMPRESSION: Anterior shoulder dislocation. No fracture of the humerus visualized. Electronically Signed   By: Dahlia Bailiff MD   On: 09/25/2020 19:09   DG Hand Complete Right  Result Date: 09/25/2020 CLINICAL DATA:  Fall with pain EXAM: RIGHT HAND - COMPLETE 3+ VIEW COMPARISON:  None. FINDINGS: There is no evidence of fracture or dislocation. Degenerative change of the interphalangeal joints as well as the first MTP and CPC joints. Soft tissues are unremarkable. IMPRESSION: No fracture or dislocation of the right hand. Degenerative changes as above. Electronically Signed   By:  Dahlia Bailiff MD   On: 09/25/2020 19:14   DG C-Arm 1-60 Min  Result Date: 09/26/2020 CLINICAL DATA:  Post reduction shoulder. EXAM: DG C-ARM 1-60 MIN; RIGHT SHOULDER - 2+ VIEW FLUOROSCOPY TIME:  Fluoroscopy Time:  24 seconds. Number of Acquired Spot Images: 2 COMPARISON:  None. FINDINGS: Two C-arm fluoroscopic images were obtained intraoperatively and submitted for post operative interpretation. These images demonstrate reduction of the patient's previously seen shoulder dislocation. Although evaluation is limited on these 2 images, the shoulder appears to be located. Postoperative radiographs could further evaluate if clinically indicated. Please see the performing provider's procedural report for further detail. IMPRESSION: Intraoperative fluoroscopy, as detailed above. Electronically Signed   By: Margaretha Sheffield MD   On: 09/26/2020 14:20   DG Hip Unilat W or Wo Pelvis 2-3 Views Left  Result Date: 09/25/2020 CLINICAL DATA:  Fall with pain EXAM: DG HIP (WITH OR WITHOUT PELVIS) 2-3V LEFT COMPARISON:  September 18, 2020 FINDINGS: There is no evidence of hip fracture or dislocation. There is no evidence of arthropathy or other focal bone abnormality. Radiopaque contrast in the colon. Vascular calcifications. Pelvic phleboliths. IMPRESSION: Negative. Electronically Signed   By: Dahlia Bailiff MD   On: 09/25/2020 19:10       Subjective: Patient sleeping on my arrival, but easily wakes up to voice, he is confused but pleasant  Discharge Exam: Vitals:   09/28/20 1330 09/28/20 2109 09/29/20 0520 09/29/20 1105  BP: (!) 157/75 (!) 84/38 (!) 99/43 (!) 113/45  Pulse: 89 85 76 63  Resp: 19 19 19    Temp: 98.2 F (36.8 C) 97.7 F (36.5 C) 97.8 F (36.6 C)   TempSrc:  Oral Oral   SpO2:  94% 100%   Weight:      Height:  General: Pt is alert, awake, not in acute distress Cardiovascular: RRR, S1/S2 +, no rubs, no gallops Respiratory: CTA bilaterally, no wheezing, no rhonchi Abdominal: Soft,  NT, ND, bowel sounds + Extremities: no edema, no cyanosis    The results of significant diagnostics from this hospitalization (including imaging, microbiology, ancillary and laboratory) are listed below for reference.     Microbiology: Recent Results (from the past 240 hour(s))  SARS CORONAVIRUS 2 (TAT 6-24 HRS) Nasopharyngeal Nasopharyngeal Swab     Status: None   Collection Time: 09/25/20  9:23 PM   Specimen: Nasopharyngeal Swab  Result Value Ref Range Status   SARS Coronavirus 2 NEGATIVE NEGATIVE Final    Comment: (NOTE) SARS-CoV-2 target nucleic acids are NOT DETECTED.  The SARS-CoV-2 RNA is generally detectable in upper and lower respiratory specimens during the acute phase of infection. Negative results do not preclude SARS-CoV-2 infection, do not rule out co-infections with other pathogens, and should not be used as the sole basis for treatment or other patient management decisions. Negative results must be combined with clinical observations, patient history, and epidemiological information. The expected result is Negative.  Fact Sheet for Patients: SugarRoll.be  Fact Sheet for Healthcare Providers: https://www.woods-mathews.com/  This test is not yet approved or cleared by the [place] FDA and  has been authorized for detection and/or diagnosis of SARS-CoV-2 by FDA under an Emergency Use Authorization (EUA). This EUA will remain  in effect (meaning this test can be used) for the duration of the COVID-19 declaration under Se ction 564(b)(1) of the Act, 21 U.S.C. section 360bbb-3(b)(1), unless the authorization is terminated or revoked sooner.  Performed at [facility], [place] [address], [place], [place] [address]   MRSA PCR Screening     Status: None   Collection Time: 09/26/20  1:45 AM   Specimen: Nasal Mucosa; Nasopharyngeal  Result Value Ref Range Status   MRSA by PCR NEGATIVE NEGATIVE Final    Comment:         The GeneXpert MRSA Assay (FDA approved for NASAL specimens only), is one component of a comprehensive MRSA colonization surveillance program. It is not intended to diagnose MRSA infection nor to guide or monitor treatment for MRSA infections. Performed at [facility], [address] [place], [place] [address]   SARS Coronavirus 2 by RT PCR (hospital order, performed in [facility] hospital lab) Nasopharyngeal Nasopharyngeal Swab     Status: None   Collection Time: 09/26/20 10:01 AM   Specimen: Nasopharyngeal Swab  Result Value Ref Range Status   SARS Coronavirus 2 NEGATIVE NEGATIVE Final    Comment: (NOTE) SARS-CoV-2 target nucleic acids are NOT DETECTED.  The SARS-CoV-2 RNA is generally detectable in upper and lower respiratory specimens during the acute phase of infection. The lowest concentration of SARS-CoV-2 viral copies this assay can detect is 250 copies / mL. A negative result does not preclude SARS-CoV-2 infection and should not be used as the sole basis for treatment or other patient management decisions.  A negative result may occur with improper specimen collection / handling, submission of specimen other than nasopharyngeal swab, presence of viral mutation(s) within the areas targeted by this assay, and inadequate number of viral copies (<250 copies / mL). A negative result must be combined with clinical observations, patient history, and epidemiological information.  Fact Sheet for Patients:   StrictlyIdeas.no  Fact Sheet for Healthcare Providers: BankingDealers.co.za  This test is not yet approved or  cleared by the [place] FDA and has  been authorized for detection and/or diagnosis of SARS-CoV-2 by FDA under an Emergency Use Authorization (EUA).  This EUA will remain in effect (meaning this test can be used) for the duration of the COVID-19 declaration under Section 564(b)(1) of the Act, 21 U.S.C. section  360bbb-3(b)(1), unless the authorization is terminated or revoked sooner.  Performed at [facility], [address] [place], [place] [address]      Labs: BNP (last 3 results) No results for input(s): BNP in the last 8760 hours. Basic Metabolic Panel: Recent Labs  Lab 09/25/20 1528 09/26/20 0623 09/27/20 0513 09/28/20 0436 09/29/20 0426  NA 137 140 139 142 139  K 3.6 3.5 3.7 3.7 3.7  CL 107 109 103 106 107  CO2 23 24 26 26 23   GLUCOSE 122* 87 82 84 78  BUN 38* 33* 39* 51* 45*  CREATININE 1.35* 1.25* 1.52* 1.84* 1.44*  CALCIUM 8.4* 8.3* 8.3* 8.5* 8.0*  MG  --  1.9 1.9  --   --   PHOS  --  3.3  --   --   --    Liver Function Tests: Recent Labs  Lab 09/25/20 1528 09/26/20 0623 09/27/20 0513  AST 48* 43* 46*  ALT 34 33 30  ALKPHOS 59 52 49  BILITOT 1.7* 2.1* 2.2*  PROT 5.6* 5.4* 5.4*  ALBUMIN 2.8* 2.7* 2.7*   No results for input(s): LIPASE, AMYLASE in the last 168 hours. No results for input(s): AMMONIA in the last 168 hours. CBC: Recent Labs  Lab 09/25/20 1528 09/26/20 0623 09/27/20 0513 09/28/20 0436 09/29/20 0426  WBC 9.8 8.9 8.2 7.2 7.2  NEUTROABS 6.5  --   --   --   --   HGB 9.4* 9.1* 9.5* 9.4* 8.7*  HCT 29.1* 28.7* 29.9* 29.5* 28.0*  MCV 100.7* 101.8* 103.1* 102.4* 104.1*  PLT 236 238 250 244 207   Cardiac Enzymes: No results for input(s): CKTOTAL, CKMB, CKMBINDEX, TROPONINI in the last 168 hours. BNP: Invalid input(s): POCBNP CBG: No results for input(s): GLUCAP in the last 168 hours. D-Dimer No results for input(s): DDIMER in the last 72 hours. Hgb A1c No results for input(s): HGBA1C in the last 72 hours. Lipid Profile No results for input(s): CHOL, HDL, LDLCALC, TRIG, CHOLHDL, LDLDIRECT in the last 72 hours. Thyroid function studies Recent Labs    09/27/20 0513  TSH 13.817*   Anemia work up Recent Labs    09/28/20 0436  FERRITIN 161  TIBC 259  IRON 71  RETICCTPCT 6.5*   Urinalysis    Component Value Date/Time   COLORURINE  YELLOW 09/26/2020 0442   APPEARANCEUR CLEAR 09/26/2020 0442   LABSPEC 1.036 (H) 09/26/2020 0442   PHURINE 6.0 09/26/2020 0442   GLUCOSEU NEGATIVE 09/26/2020 0442   HGBUR SMALL (A) 09/26/2020 0442   BILIRUBINUR NEGATIVE 09/26/2020 0442   KETONESUR NEGATIVE 09/26/2020 0442   PROTEINUR NEGATIVE 09/26/2020 0442   NITRITE NEGATIVE 09/26/2020 0442   LEUKOCYTESUR NEGATIVE 09/26/2020 0442   Sepsis Labs Invalid input(s): PROCALCITONIN,  WBC,  LACTICIDVEN Microbiology Recent Results (from the past 240 hour(s))  SARS CORONAVIRUS 2 (TAT 6-24 HRS) Nasopharyngeal Nasopharyngeal Swab     Status: None   Collection Time: 09/25/20  9:23 PM   Specimen: Nasopharyngeal Swab  Result Value Ref Range Status   SARS Coronavirus 2 NEGATIVE NEGATIVE Final    Comment: (NOTE) SARS-CoV-2 target nucleic acids are NOT DETECTED.  The SARS-CoV-2 RNA is generally detectable in upper and lower respiratory specimens during the acute phase of  infection. Negative results do not preclude SARS-CoV-2 infection, do not rule out co-infections with other pathogens, and should not be used as the sole basis for treatment or other patient management decisions. Negative results must be combined with clinical observations, patient history, and epidemiological information. The expected result is Negative.  Fact Sheet for Patients: SugarRoll.be  Fact Sheet for Healthcare Providers: https://www.woods-mathews.com/  This test is not yet approved or cleared by the [place] FDA and  has been authorized for detection and/or diagnosis of SARS-CoV-2 by FDA under an Emergency Use Authorization (EUA). This EUA will remain  in effect (meaning this test can be used) for the duration of the COVID-19 declaration under Se ction 564(b)(1) of the Act, 21 U.S.C. section 360bbb-3(b)(1), unless the authorization is terminated or revoked sooner.  Performed at [facility], [place] [address] [place], [place] [address]   MRSA PCR Screening     Status: None   Collection Time: 09/26/20  1:45 AM   Specimen: Nasal Mucosa; Nasopharyngeal  Result Value Ref Range Status   MRSA by PCR NEGATIVE NEGATIVE Final    Comment:        The GeneXpert MRSA Assay (FDA approved for NASAL specimens only), is one component of a comprehensive MRSA colonization surveillance program. It is not intended to diagnose MRSA infection nor to guide or monitor treatment for MRSA infections. Performed at [facility], [address] [place], [place] [address]   SARS Coronavirus 2 by RT PCR (hospital order, performed in [facility] hospital lab) Nasopharyngeal Nasopharyngeal Swab     Status: None   Collection Time: 09/26/20 10:01 AM   Specimen: Nasopharyngeal Swab  Result Value Ref Range Status   SARS Coronavirus 2 NEGATIVE NEGATIVE Final    Comment: (NOTE) SARS-CoV-2 target nucleic acids are NOT DETECTED.  The SARS-CoV-2 RNA is generally detectable in upper and lower respiratory specimens during the acute phase of infection. The lowest concentration of SARS-CoV-2 viral copies this assay can detect is 250 copies / mL. A negative result does not preclude SARS-CoV-2 infection and should not be used as the sole basis for treatment or other patient management decisions.  A negative result may occur with improper specimen collection / handling, submission of specimen other than nasopharyngeal swab, presence of viral mutation(s) within the areas targeted by this assay, and inadequate number of viral copies (<250 copies / mL). A negative result must be combined with clinical observations, patient history, and epidemiological information.  Fact Sheet for Patients:   StrictlyIdeas.no  Fact Sheet for Healthcare Providers: BankingDealers.co.za  This test is not yet approved or  cleared by the [place] FDA and has been authorized for detection and/or  diagnosis of SARS-CoV-2 by FDA under an Emergency Use Authorization (EUA).  This EUA will remain in effect (meaning this test can be used) for the duration of the COVID-19 declaration under Section 564(b)(1) of the Act, 21 U.S.C. section 360bbb-3(b)(1), unless the authorization is terminated or revoked sooner.  Performed at [facility], [address] [place], [place] [address]      Time coordinating discharge: 93mins  SIGNED:   Kathie Dike, MD  [facility] 09/29/2020, 11:56 AM   If 7PM-7AM, please contact night-coverage [email or web address]

## 2020-09-29 NOTE — Progress Notes (Signed)
Patient found naked with IV out of his arm. Bandaged up arms again with tefla and gauze. New 20 gauge IV placed in LAC. New bed linen and gown placed on patient.

## 2020-09-29 NOTE — Progress Notes (Signed)
Patient refused lunch

## 2020-09-30 LAB — RESP PANEL BY RT-PCR (FLU A&B, COVID) ARPGX2
Influenza A by PCR: NEGATIVE
Influenza B by PCR: NEGATIVE
SARS Coronavirus 2 by RT PCR: NEGATIVE

## 2020-09-30 LAB — BASIC METABOLIC PANEL
Anion gap: 6 (ref 5–15)
BUN: 39 mg/dL — ABNORMAL HIGH (ref 8–23)
CO2: 23 mmol/L (ref 22–32)
Calcium: 8 mg/dL — ABNORMAL LOW (ref 8.9–10.3)
Chloride: 111 mmol/L (ref 98–111)
Creatinine, Ser: 1.33 mg/dL — ABNORMAL HIGH (ref 0.61–1.24)
GFR, Estimated: 50 mL/min — ABNORMAL LOW (ref >60)
Glucose, Bld: 89 mg/dL (ref 70–99)
Potassium: 4 mmol/L (ref 3.5–5.1)
Sodium: 140 mmol/L (ref 135–145)

## 2020-09-30 LAB — CBC
HCT: 28.3 % — ABNORMAL LOW (ref 39.0–52.0)
Hemoglobin: 8.8 g/dL — ABNORMAL LOW (ref 13.0–17.0)
MCH: 32.7 pg (ref 26.0–34.0)
MCHC: 31.1 g/dL (ref 30.0–36.0)
MCV: 105.2 fL — ABNORMAL HIGH (ref 80.0–100.0)
Platelets: 224 10*3/uL (ref 150–400)
RBC: 2.69 MIL/uL — ABNORMAL LOW (ref 4.22–5.81)
RDW: 19.5 % — ABNORMAL HIGH (ref 11.5–15.5)
WBC: 8 10*3/uL (ref 4.0–10.5)
nRBC: 0 % (ref 0.0–0.2)

## 2020-09-30 MED ORDER — SODIUM CHLORIDE 0.9 % IV BOLUS
500.0000 mL | Freq: Once | INTRAVENOUS | Status: AC
  Administered 2020-09-30: 500 mL via INTRAVENOUS

## 2020-09-30 NOTE — TOC Progression Note (Addendum)
Transition of Care [facility]) - Progression Note    Patient Details  Name: Tracy Castillo MRN: [id number] Date of Birth: [birth year]  Transition of Care [facility]) CM/SW Contact  Randall Rampersad, [place], [place] Phone [phone] 09/30/2020, 11:25 AM  Clinical Narrative:    Patient to discharge today to [facility]. Discharge summary faxed to [phone]. Report to be called in to [place]. Patient to be transported by [facility].  Facility called back, patient will need  rapid covid test before being admitted.  1:20pm [facility] contacted to arrange transport to [facility].  [address] LCSW Transition of Care [phone]    Expected Discharge Plan: Skilled Nursing Facility Barriers to Discharge: Continued Medical Work up  Expected Discharge Plan and Services Expected Discharge Plan: [place] In-house Referral: Clinical Social Work Discharge Planning Services: CM Consult Post Acute Care Choice: [place] arrangements for the past 2 months: [place] Expected Discharge Date: 09/30/20                                     Social Determinants of Health (SDOH) Interventions    Readmission Risk Interventions Readmission Risk Prevention Plan 09/27/2020  Transportation Screening Complete  Home Care Screening Complete  Medication Review (RN CM) Complete  Some recent data might be hidden

## 2020-09-30 NOTE — Progress Notes (Deleted)
Central tele called about a pause of 1.25. MD Memon notified.

## 2020-09-30 NOTE — Discharge Summary (Signed)
Physician Discharge Summary  Tracy Castillo [id number] DOB: 1924-06-10 DOA: 09/25/2020  PCP: Waldon Reining, NP  Admit date: 09/25/2020 Discharge date: 09/30/2020  Admitted From: SNF Disposition:  SNF  Recommendations for Outpatient Follow-up:  1. Follow up with PCP in 1-2 weeks 2. Please obtain BMP/CBC in one week 3. NWB on right upper extremity, should be in sling 4. Follow up with Dr. Amedeo Kinsman in 2 weeks for skin check and xray 5. Repeat thyroid studies in 3 weeks 6. Recommended palliative care follow the patient at skilled nursing facility to further address goals of care   Discharge Condition: stable CODE STATUS:DNR Diet recommendation: dysphagia 1 diet with nectar thick liquids  Brief/Interim Summary: 87 y.o.malewith medical history significant forcomplete heart block with pacemaker, dementia, hypothyroidism, glaucoma who presented to the emergency department via EMS from [facility] rehab facility due to right shoulder dislocation. Patient was unable to provide history possibly due to underlying dementia, history was obtained from ED PA and ED medical record. Per report, patient was reported to be admitted to the facility on 4/21 due to right shoulder dislocation and subsequent reduction. Patient was reported to be discharged from this facility because patient was not keeping the sling on his right arm. Patient was reported to have falling about 2 days ago and he was complaining of right shoulder right arm right flank and left hip pain. Patient denies loss of breath, chest pain, abdominal pain at bedside. Orthopedic Surgery consulted at time of presentation  Discharge Diagnoses:  Active Problems:   Complete heart block (HCC)   Anterior dislocation of right shoulder   Esophageal hiatal hernia   Bilateral pleural effusion   Hypothyroidism   Glaucoma   Macrocytic anemia   Hypoalbuminemia   Chronic kidney disease  Anterior dislocation of right shoulder Patient  was seen by the orthopedic service.  Underwent close reduction.  Seems to be stable.  Use tramadol for pain.  PT and OT.  Needs short-term rehab at skilled nursing facility. He is NWB on right shoulder. Follow up with ortho in 2 weeks  Acute on chronic kidney disease stage IIIA BUN/creatinine 38/1.35.  Rise in creatinine thought to be due to poor oral intake and furosemide.  Furosemide was held.  BUN and creatinine continues to climb.  There is no evidence for volume overload at this time.  If anything he is likely dehydrated.  He was hydrated with IV fluids with improvement in renal function back to baseline.    Macrocytic anemia Hemoglobin stable.  Anemia panel unremarkable including B12 and folate levels.  TSH was noted to be elevated.    Complete heart block Stable at present  Esophageal hiatal hernia Continue Protonix as tolerated  Bilateral pleural effusion CT of the chest showedSmall bilateral pleural effusions with basilar atelectasis.  Does not have any respiratory symptoms.  Saturations are normal.  Hypothyroidism Levothyroxine being continued.  TSH noted to be 13.  Free T4 mildly elevated at 1.23. At this point, will not make any changes to levothyroxine dose and would recommend rechecking thyroid studies in 3 weeks once patient's acute issues have settled down  Hypoalbuminemia Albumin 2.8 Cont with supplemental nutrition as needed  Glaucoma Patient was not taking home latanoprost per med rec.  Acute Rib Fracture involving right 12th and left seventh Does not appear to be in any significant pain at this time.  Continue to monitor.  Incentive spirometry.  Multiple thoracic vertebral compression deformities These are chronic based on CT report.  Cholelithiasis Stable.  Outpatient monitoring.  History of dementia Stable.    Low blood pressure Patient's blood pressures have been running on the lower side around 100-110/40.  Patient is asymptomatic with  these blood pressures.  I suspect this is closer to his baseline.  No further treatments at this time.  Goals of care Dr. Maryland Pink discussed code status with patient's POA [name], and he relayed that patient would not want to receive heroic measures, no life support, no CPR. DNR order was placed.  Discharge Instructions  Discharge Instructions    Diet - low sodium heart healthy   Complete by: As directed    Diet - low sodium heart healthy   Complete by: As directed    Increase activity slowly   Complete by: As directed    Increase activity slowly   Complete by: As directed    No wound care   Complete by: As directed    No wound care   Complete by: As directed      Allergies as of 09/30/2020   No Known Allergies     Medication List    STOP taking these medications   donepezil 5 MG tablet Commonly known as: ARICEPT   latanoprost 0.005 % ophthalmic solution Commonly known as: XALATAN   OMEGA-3 FISH OIL PO   VITAMIN B PLUS+ PO     TAKE these medications   acetaminophen 325 MG tablet Commonly known as: TYLENOL Take 650 mg by mouth every 6 (six) hours as needed for mild pain. What changed: Another medication with the same name was removed. Continue taking this medication, and follow the directions you see here.   aspirin 81 MG EC tablet Take 81 mg by mouth daily.   dorzolamide 2 % ophthalmic solution Commonly known as: TRUSOPT Place 1 drop into the left eye 3 (three) times daily.   feeding supplement Liqd Take 237 mLs by mouth 2 (two) times daily between meals.   ferrous sulfate 325 (65 FE) MG tablet Take 1 tablet by mouth daily.   levothyroxine 100 MCG tablet Commonly known as: SYNTHROID Take 100 mcg by mouth daily. What changed: Another medication with the same name was removed. Continue taking this medication, and follow the directions you see here.   polyethylene glycol 17 g packet Commonly known as: MIRALAX / GLYCOLAX Take 17 g by mouth daily.    QUEtiapine 25 MG tablet Commonly known as: SEROQUEL Take 1 tablet (25 mg total) by mouth at bedtime.   senna 8.6 MG Tabs tablet Commonly known as: SENOKOT Take 1 tablet (8.6 mg total) by mouth at bedtime. What changed:   when to take this  reasons to take this   traMADol 50 MG tablet Commonly known as: ULTRAM Take 1 tablet (50 mg total) by mouth every 6 (six) hours as needed for moderate pain.       Follow-up Information    Mordecai Rasmussen, MD. Schedule an appointment as soon as possible for a visit in 2 week(s).   Specialties: Orthopedic Surgery, Sports Medicine Contact information: [place]. [address] [place] [address] [phone]              No Known Allergies  Consultations:  Orthopedics   Procedures/Studies: DG Chest 1 View  Result Date: 09/25/2020 CLINICAL DATA:  Fall with shoulder pain EXAM: CHEST  1 VIEW COMPARISON:  September 21, 2020 FINDINGS: Two lead left chest pacemaker with leads overlying the right atrium and right ventricle. Heart size is upper limits of normal.  Moderate size hiatal hernia. No focal consolidation. No significant pleural effusion or visible pneumothorax. Aortic atherosclerosis. Anterior dislocation of the right humeral head at the glenoid. IMPRESSION: 1. Anterior dislocation of the right humeral head at the glenoid. 2. No acute cardiopulmonary abnormality. Electronically Signed   By: Dahlia Bailiff MD   On: 09/25/2020 19:12   DG Shoulder Right  Result Date: 09/26/2020 CLINICAL DATA:  Post reduction shoulder. EXAM: DG C-ARM 1-60 MIN; RIGHT SHOULDER - 2+ VIEW FLUOROSCOPY TIME:  Fluoroscopy Time:  24 seconds. Number of Acquired Spot Images: 2 COMPARISON:  None. FINDINGS: Two C-arm fluoroscopic images were obtained intraoperatively and submitted for post operative interpretation. These images demonstrate reduction of the patient's previously seen shoulder dislocation. Although evaluation is limited on these 2 images, the shoulder appears to be  located. Postoperative radiographs could further evaluate if clinically indicated. Please see the performing provider's procedural report for further detail. IMPRESSION: Intraoperative fluoroscopy, as detailed above. Electronically Signed   By: Margaretha Sheffield MD   On: 09/26/2020 14:20   DG Shoulder Right  Result Date: 09/25/2020 CLINICAL DATA:  Right shoulder dislocation, attempted reduction EXAM: RIGHT SHOULDER - 2+ VIEW COMPARISON:  None. FINDINGS: Single-view, frontal radiograph of the right shoulder demonstrates persistent anteroinferior right glenohumeral dislocation. No definite superimposed fracture. Acromioclavicular joint space appears preserved. There is diffuse subcutaneous edema within the soft tissues of the right shoulder and visualized right upper extremity. Limited evaluation of the right hemithorax is unremarkable. IMPRESSION: Persistent anteroinferior right shoulder dislocation. Electronically Signed   By: Fidela Salisbury MD   On: 09/25/2020 23:37   DG Shoulder Right  Result Date: 09/25/2020 CLINICAL DATA:  Fall with pain. EXAM: RIGHT SHOULDER - 2+ VIEW COMPARISON:  September 18, 2020. FINDINGS: Anterior dislocation of the humeral head at the glenoid. Soft tissues are unremarkable. IMPRESSION: Anterior dislocation of the humeral head. Electronically Signed   By: Dahlia Bailiff MD   On: 09/25/2020 19:08   DG Forearm Right  Result Date: 09/25/2020 CLINICAL DATA:  Fall with shoulder pain EXAM: RIGHT FOREARM - 2 VIEW COMPARISON:  September 18, 2020. FINDINGS: There is no evidence of fracture or other focal bone lesions. Soft tissues are unremarkable. IMPRESSION: Negative. Electronically Signed   By: Dahlia Bailiff MD   On: 09/25/2020 19:04   CT Head Wo Contrast  Result Date: 09/25/2020 CLINICAL DATA:  Un witnessed fall on 04/21, head laceration EXAM: CT HEAD WITHOUT CONTRAST CT CERVICAL SPINE WITHOUT CONTRAST TECHNIQUE: Multidetector CT imaging of the head and cervical spine was performed  following the standard protocol without intravenous contrast. Multiplanar CT image reconstructions of the cervical spine were also generated. COMPARISON:  09/21/2020 FINDINGS: CT HEAD FINDINGS Brain: No evidence of acute large vascular territory infarction, hemorrhage, hydrocephalus, extra-axial collection or mass lesion/mass effect. Stable global parenchymal volume loss and ischemic change. Vascular: No hyperdense vessel. Atherosclerotic calcifications of the internal carotid arteries and vertebral arteries at the skull base. Skull: Normal. Negative for fracture or focal lesion. Sinuses/Orbits: The paranasal sinuses and mastoid air cells are predominantly clear. Prior lens surgery. Other: None CT CERVICAL SPINE FINDINGS Alignment: Normal.  No evidence of traumatic listhesis. Skull base and vertebrae: No acute fracture. No primary bone lesion or focal pathologic process. Soft tissues and spinal canal: No prevertebral fluid or swelling. No visible canal hematoma. Disc levels: Stable multilevel degenerative change of the spine including disc space narrowing, uncovertebral and facet hypertrophy as well as osteophytosis most significant in the lower cervical spine at C5-C6 and C6-C7.  Upper chest: Negative. Other: None IMPRESSION: Motion degraded examination, within this context: 1. No evidence of acute intracranial pathology. 2. Stable chronic atrophic and ischemic changes compared to prior examination. 3. No evidence of acute fracture or traumatic listhesis of the cervical spine. 4. Stable multilevel degenerative change of the cervical spine. Electronically Signed   By: Dahlia Bailiff MD   On: 09/25/2020 17:02   CT Chest W Contrast  Result Date: 09/25/2020 CLINICAL DATA:  Unwitnessed fall with bruising to the right side of the body and various stages of healing. EXAM: CT CHEST, ABDOMEN, AND PELVIS WITH CONTRAST TECHNIQUE: Multidetector CT imaging of the chest, abdomen and pelvis was performed following the standard  protocol during bolus administration of intravenous contrast. CONTRAST:  121mL OMNIPAQUE IOHEXOL 300 MG/ML  SOLN COMPARISON:  09/18/2020 FINDINGS: CT CHEST FINDINGS Cardiovascular: Normal heart size. No pericardial effusions. Calcification in the aorta, coronary arteries, and mitral valve annulus. Cardiac pacemaker. Normal caliber thoracic aorta with diffuse calcification. Mediastinum/Nodes: No significant lymphadenopathy. Moderate esophageal hiatal hernia. Esophagus is decompressed. Lungs/Pleura: Small bilateral pleural effusions with basilar atelectasis. Peripheral interstitial pattern to the lungs may represent early edema. No focal consolidation. No pneumothorax. Musculoskeletal: Complete anterior dislocation of the right humeral head with respect to the glenoid, new since prior study. Large right shoulder effusion with edema in the visualized right upper arm. Edema and infiltration in the subcutaneous fat of the right axilla and right chest wall. Acute appearing nondisplaced fractures of the right twelfth rib and left seventh rib. Multiple thoracic vertebral compression deformities at T1, T2, and T4 levels are unchanged since prior study. The sternum appears intact. CT ABDOMEN PELVIS FINDINGS Hepatobiliary: Cholelithiasis without evidence of cholecystitis. No bile duct dilatation. No focal liver lesions. Pancreas: Unremarkable. No pancreatic ductal dilatation or surrounding inflammatory changes. Spleen: No splenic injury or perisplenic hematoma. Adrenals/Urinary Tract: No adrenal hemorrhage or renal injury identified. Bladder is unremarkable. Stomach/Bowel: Stomach, small bowel, and colon are not abnormally distended. Stool fills the colon. Residual contrast material in the rectosigmoid region. Vascular/Lymphatic: Prominent aortic calcification. No aneurysm. No significant lymphadenopathy. Reproductive: Prostate is unremarkable. Other: No free air or free fluid in the abdomen. Abdominal wall musculature appears  intact. Mild edema in the subcutaneous fat along the right flank region, likely contusion. Musculoskeletal: Normal alignment of the lumbar spine. Diffuse degenerative change. No vertebral compression deformities. The sacrum, pelvis, and hips appear intact. IMPRESSION: 1. Complete anterior dislocation of the right humeral head with respect to the glenoid, new since prior study. Large right shoulder effusion with edema in the visualized right upper arm. Edema and infiltration in the subcutaneous fat of the right axilla and right chest wall. 2. Acute appearing nondisplaced fractures of the right twelfth rib and left seventh rib. 3. Multiple thoracic vertebral compression deformities are unchanged since prior study. 4. Small bilateral pleural effusions with basilar atelectasis. Peripheral interstitial pattern to the lungs may represent early edema. 5. Moderate esophageal hiatal hernia. 6. Cholelithiasis without evidence of cholecystitis. 7. Mild edema in the subcutaneous fat along the right flank region, likely contusion. 8. Aortic atherosclerosis. Aortic Atherosclerosis (ICD10-I70.0). Electronically Signed   By: Lucienne Capers M.D.   On: 09/25/2020 20:47   CT Cervical Spine Wo Contrast  Result Date: 09/25/2020 CLINICAL DATA:  Un witnessed fall on 04/21, head laceration EXAM: CT HEAD WITHOUT CONTRAST CT CERVICAL SPINE WITHOUT CONTRAST TECHNIQUE: Multidetector CT imaging of the head and cervical spine was performed following the standard protocol without intravenous contrast. Multiplanar CT image reconstructions of  the cervical spine were also generated. COMPARISON:  09/21/2020 FINDINGS: CT HEAD FINDINGS Brain: No evidence of acute large vascular territory infarction, hemorrhage, hydrocephalus, extra-axial collection or mass lesion/mass effect. Stable global parenchymal volume loss and ischemic change. Vascular: No hyperdense vessel. Atherosclerotic calcifications of the internal carotid arteries and vertebral  arteries at the skull base. Skull: Normal. Negative for fracture or focal lesion. Sinuses/Orbits: The paranasal sinuses and mastoid air cells are predominantly clear. Prior lens surgery. Other: None CT CERVICAL SPINE FINDINGS Alignment: Normal.  No evidence of traumatic listhesis. Skull base and vertebrae: No acute fracture. No primary bone lesion or focal pathologic process. Soft tissues and spinal canal: No prevertebral fluid or swelling. No visible canal hematoma. Disc levels: Stable multilevel degenerative change of the spine including disc space narrowing, uncovertebral and facet hypertrophy as well as osteophytosis most significant in the lower cervical spine at C5-C6 and C6-C7. Upper chest: Negative. Other: None IMPRESSION: Motion degraded examination, within this context: 1. No evidence of acute intracranial pathology. 2. Stable chronic atrophic and ischemic changes compared to prior examination. 3. No evidence of acute fracture or traumatic listhesis of the cervical spine. 4. Stable multilevel degenerative change of the cervical spine. Electronically Signed   By: Maudry MayhewJeffrey  Waltz MD   On: 09/25/2020 17:02   CT ABDOMEN PELVIS W CONTRAST  Result Date: 09/25/2020 CLINICAL DATA:  Unwitnessed fall with bruising to the right side of the body and various stages of healing. EXAM: CT CHEST, ABDOMEN, AND PELVIS WITH CONTRAST TECHNIQUE: Multidetector CT imaging of the chest, abdomen and pelvis was performed following the standard protocol during bolus administration of intravenous contrast. CONTRAST:  100mL OMNIPAQUE IOHEXOL 300 MG/ML  SOLN COMPARISON:  09/18/2020 FINDINGS: CT CHEST FINDINGS Cardiovascular: Normal heart size. No pericardial effusions. Calcification in the aorta, coronary arteries, and mitral valve annulus. Cardiac pacemaker. Normal caliber thoracic aorta with diffuse calcification. Mediastinum/Nodes: No significant lymphadenopathy. Moderate esophageal hiatal hernia. Esophagus is decompressed.  Lungs/Pleura: Small bilateral pleural effusions with basilar atelectasis. Peripheral interstitial pattern to the lungs may represent early edema. No focal consolidation. No pneumothorax. Musculoskeletal: Complete anterior dislocation of the right humeral head with respect to the glenoid, new since prior study. Large right shoulder effusion with edema in the visualized right upper arm. Edema and infiltration in the subcutaneous fat of the right axilla and right chest wall. Acute appearing nondisplaced fractures of the right twelfth rib and left seventh rib. Multiple thoracic vertebral compression deformities at T1, T2, and T4 levels are unchanged since prior study. The sternum appears intact. CT ABDOMEN PELVIS FINDINGS Hepatobiliary: Cholelithiasis without evidence of cholecystitis. No bile duct dilatation. No focal liver lesions. Pancreas: Unremarkable. No pancreatic ductal dilatation or surrounding inflammatory changes. Spleen: No splenic injury or perisplenic hematoma. Adrenals/Urinary Tract: No adrenal hemorrhage or renal injury identified. Bladder is unremarkable. Stomach/Bowel: Stomach, small bowel, and colon are not abnormally distended. Stool fills the colon. Residual contrast material in the rectosigmoid region. Vascular/Lymphatic: Prominent aortic calcification. No aneurysm. No significant lymphadenopathy. Reproductive: Prostate is unremarkable. Other: No free air or free fluid in the abdomen. Abdominal wall musculature appears intact. Mild edema in the subcutaneous fat along the right flank region, likely contusion. Musculoskeletal: Normal alignment of the lumbar spine. Diffuse degenerative change. No vertebral compression deformities. The sacrum, pelvis, and hips appear intact. IMPRESSION: 1. Complete anterior dislocation of the right humeral head with respect to the glenoid, new since prior study. Large right shoulder effusion with edema in the visualized right upper arm. Edema and infiltration in  the  subcutaneous fat of the right axilla and right chest wall. 2. Acute appearing nondisplaced fractures of the right twelfth rib and left seventh rib. 3. Multiple thoracic vertebral compression deformities are unchanged since prior study. 4. Small bilateral pleural effusions with basilar atelectasis. Peripheral interstitial pattern to the lungs may represent early edema. 5. Moderate esophageal hiatal hernia. 6. Cholelithiasis without evidence of cholecystitis. 7. Mild edema in the subcutaneous fat along the right flank region, likely contusion. 8. Aortic atherosclerosis. Aortic Atherosclerosis (ICD10-I70.0). Electronically Signed   By: Lucienne Capers M.D.   On: 09/25/2020 20:47   DG Shoulder Right Port  Result Date: 09/27/2020 CLINICAL DATA:  Anterior shoulder dislocation post reduction EXAM: PORTABLE RIGHT SHOULDER COMPARISON:  09/25/2020 FINDINGS: The glenohumeral joint is intact. Shoulder is located. No fracture identified. IMPRESSION: Intact glenohumeral joint post reduction. Electronically Signed   By: Suzy Bouchard M.D.   On: 09/27/2020 11:23   DG Shoulder Right Port  Result Date: 09/26/2020 CLINICAL DATA:  The patient suffered an anterior right shoulder dislocation in a fall today. Status post reduction. EXAM: PORTABLE RIGHT SHOULDER COMPARISON:  Plain films right shoulder earlier today. FINDINGS: The shoulder is now located.  No new abnormality. IMPRESSION: Successful reduction of dislocation. Electronically Signed   By: Inge Rise M.D.   On: 09/26/2020 14:02   DG Humerus Right  Result Date: 09/25/2020 CLINICAL DATA:  Fall with pain EXAM: RIGHT HUMERUS - 2+ VIEW COMPARISON:  September 18, 2020 FINDINGS: There is no evidence of humerus fracture. Anterior dislocation of the humeral head at the glenoid soft tissues are unremarkable. IMPRESSION: Anterior shoulder dislocation. No fracture of the humerus visualized. Electronically Signed   By: Dahlia Bailiff MD   On: 09/25/2020 19:09   DG Hand  Complete Right  Result Date: 09/25/2020 CLINICAL DATA:  Fall with pain EXAM: RIGHT HAND - COMPLETE 3+ VIEW COMPARISON:  None. FINDINGS: There is no evidence of fracture or dislocation. Degenerative change of the interphalangeal joints as well as the first MTP and CPC joints. Soft tissues are unremarkable. IMPRESSION: No fracture or dislocation of the right hand. Degenerative changes as above. Electronically Signed   By: Dahlia Bailiff MD   On: 09/25/2020 19:14   DG C-Arm 1-60 Min  Result Date: 09/26/2020 CLINICAL DATA:  Post reduction shoulder. EXAM: DG C-ARM 1-60 MIN; RIGHT SHOULDER - 2+ VIEW FLUOROSCOPY TIME:  Fluoroscopy Time:  24 seconds. Number of Acquired Spot Images: 2 COMPARISON:  None. FINDINGS: Two C-arm fluoroscopic images were obtained intraoperatively and submitted for post operative interpretation. These images demonstrate reduction of the patient's previously seen shoulder dislocation. Although evaluation is limited on these 2 images, the shoulder appears to be located. Postoperative radiographs could further evaluate if clinically indicated. Please see the performing provider's procedural report for further detail. IMPRESSION: Intraoperative fluoroscopy, as detailed above. Electronically Signed   By: Margaretha Sheffield MD   On: 09/26/2020 14:20   DG Hip Unilat W or Wo Pelvis 2-3 Views Left  Result Date: 09/25/2020 CLINICAL DATA:  Fall with pain EXAM: DG HIP (WITH OR WITHOUT PELVIS) 2-3V LEFT COMPARISON:  September 18, 2020 FINDINGS: There is no evidence of hip fracture or dislocation. There is no evidence of arthropathy or other focal bone abnormality. Radiopaque contrast in the colon. Vascular calcifications. Pelvic phleboliths. IMPRESSION: Negative. Electronically Signed   By: Dahlia Bailiff MD   On: 09/25/2020 19:10      Subjective: Sitting up in bed, awake, alert, denies any complaints,  Discharge Exam: Vitals:  09/29/20 2040 09/29/20 2122 09/30/20 0446 09/30/20 0702  BP: (!)  145/85  (!) 100/40 (!) 107/44  Pulse: 85  76 75  Resp: 18  19   Temp: 98.2 F (36.8 C)  98.3 F (36.8 C)   TempSrc:      SpO2:  97% 100%   Weight:      Height:        General: Pt is alert, awake, not in acute distress Cardiovascular: RRR, S1/S2 +, no rubs, no gallops Respiratory: CTA bilaterally, no wheezing, no rhonchi Abdominal: Soft, NT, ND, bowel sounds + Extremities: no edema, no cyanosis    The results of significant diagnostics from this hospitalization (including imaging, microbiology, ancillary and laboratory) are listed below for reference.     Microbiology: Recent Results (from the past 240 hour(s))  SARS CORONAVIRUS 2 (TAT 6-24 HRS) Nasopharyngeal Nasopharyngeal Swab     Status: None   Collection Time: 09/25/20  9:23 PM   Specimen: Nasopharyngeal Swab  Result Value Ref Range Status   SARS Coronavirus 2 NEGATIVE NEGATIVE Final    Comment: (NOTE) SARS-CoV-2 target nucleic acids are NOT DETECTED.  The SARS-CoV-2 RNA is generally detectable in upper and lower respiratory specimens during the acute phase of infection. Negative results do not preclude SARS-CoV-2 infection, do not rule out co-infections with other pathogens, and should not be used as the sole basis for treatment or other patient management decisions. Negative results must be combined with clinical observations, patient history, and epidemiological information. The expected result is Negative.  Fact Sheet for Patients: SugarRoll.be  Fact Sheet for Healthcare Providers: https://www.woods-mathews.com/  This test is not yet approved or cleared by the [place] FDA and  has been authorized for detection and/or diagnosis of SARS-CoV-2 by FDA under an Emergency Use Authorization (EUA). This EUA will remain  in effect (meaning this test can be used) for the duration of the COVID-19 declaration under Se ction 564(b)(1) of the Act, 21 U.S.C. section  360bbb-3(b)(1), unless the authorization is terminated or revoked sooner.  Performed at [facility], [place] [address] [place], [place] [address]   MRSA PCR Screening     Status: None   Collection Time: 09/26/20  1:45 AM   Specimen: Nasal Mucosa; Nasopharyngeal  Result Value Ref Range Status   MRSA by PCR NEGATIVE NEGATIVE Final    Comment:        The GeneXpert MRSA Assay (FDA approved for NASAL specimens only), is one component of a comprehensive MRSA colonization surveillance program. It is not intended to diagnose MRSA infection nor to guide or monitor treatment for MRSA infections. Performed at [facility], [address], [place], [place] [address]   SARS Coronavirus 2 by RT PCR (hospital order, performed in [facility] hospital lab) Nasopharyngeal Nasopharyngeal Swab     Status: None   Collection Time: 09/26/20 10:01 AM   Specimen: Nasopharyngeal Swab  Result Value Ref Range Status   SARS Coronavirus 2 NEGATIVE NEGATIVE Final    Comment: (NOTE) SARS-CoV-2 target nucleic acids are NOT DETECTED.  The SARS-CoV-2 RNA is generally detectable in upper and lower respiratory specimens during the acute phase of infection. The lowest concentration of SARS-CoV-2 viral copies this assay can detect is 250 copies / mL. A negative result does not preclude SARS-CoV-2 infection and should not be used as the sole basis for treatment or other patient management decisions.  A negative result may occur with improper specimen collection / handling, submission of specimen other than nasopharyngeal  swab, presence of viral mutation(s) within the areas targeted by this assay, and inadequate number of viral copies (<250 copies / mL). A negative result must be combined with clinical observations, patient history, and epidemiological information.  Fact Sheet for Patients:   StrictlyIdeas.no  Fact Sheet for Healthcare  Providers: BankingDealers.co.za  This test is not yet approved or  cleared by the [place] FDA and has been authorized for detection and/or diagnosis of SARS-CoV-2 by FDA under an Emergency Use Authorization (EUA).  This EUA will remain in effect (meaning this test can be used) for the duration of the COVID-19 declaration under Section 564(b)(1) of the Act, 21 U.S.C. section 360bbb-3(b)(1), unless the authorization is terminated or revoked sooner.  Performed at [facility], [address] [place], [place] [address]      Labs: BNP (last 3 results) No results for input(s): BNP in the last 8760 hours. Basic Metabolic Panel: Recent Labs  Lab 09/26/20 0623 09/27/20 0513 09/28/20 0436 09/29/20 0426 09/29/20 1438 09/30/20 0433  NA 140 139 142 139 139 140  K 3.5 3.7 3.7 3.7 3.9 4.0  CL 109 103 106 107 107 111  CO2 24 26 26 23 25 23   GLUCOSE 87 82 84 78 101* 89  BUN 33* 39* 51* 45* 44* 39*  CREATININE 1.25* 1.52* 1.84* 1.44* 1.48* 1.33*  CALCIUM 8.3* 8.3* 8.5* 8.0* 7.9* 8.0*  MG 1.9 1.9  --   --   --   --   PHOS 3.3  --   --   --  3.1  --    Liver Function Tests: Recent Labs  Lab 09/25/20 1528 09/26/20 0623 09/27/20 0513 09/29/20 1438  AST 48* 43* 46*  --   ALT 34 33 30  --   ALKPHOS 59 52 49  --   BILITOT 1.7* 2.1* 2.2*  --   PROT 5.6* 5.4* 5.4*  --   ALBUMIN 2.8* 2.7* 2.7* 2.6*   No results for input(s): LIPASE, AMYLASE in the last 168 hours. No results for input(s): AMMONIA in the last 168 hours. CBC: Recent Labs  Lab 09/25/20 1528 09/26/20 0623 09/27/20 0513 09/28/20 0436 09/29/20 0426 09/29/20 1353 09/30/20 0433  WBC 9.8   < > 8.2 7.2 7.2 7.7 8.0  NEUTROABS 6.5  --   --   --   --   --   --   HGB 9.4*   < > 9.5* 9.4* 8.7* 9.3* 8.8*  HCT 29.1*   < > 29.9* 29.5* 28.0* 28.8* 28.3*  MCV 100.7*   < > 103.1* 102.4* 104.1* 104.0* 105.2*  PLT 236   < > 250 244 207 228 224   < > = values in this interval not displayed.   Cardiac  Enzymes: No results for input(s): CKTOTAL, CKMB, CKMBINDEX, TROPONINI in the last 168 hours. BNP: Invalid input(s): POCBNP CBG: No results for input(s): GLUCAP in the last 168 hours. D-Dimer No results for input(s): DDIMER in the last 72 hours. Hgb A1c No results for input(s): HGBA1C in the last 72 hours. Lipid Profile No results for input(s): CHOL, HDL, LDLCALC, TRIG, CHOLHDL, LDLDIRECT in the last 72 hours. Thyroid function studies No results for input(s): TSH, T4TOTAL, T3FREE, THYROIDAB in the last 72 hours.  Invalid input(s): FREET3 Anemia work up Recent Labs    09/28/20 0436  FERRITIN 161  TIBC 259  IRON 71  RETICCTPCT 6.5*   Urinalysis    Component Value Date/Time   COLORURINE YELLOW 09/26/2020 [place]  CLEAR 09/26/2020 0442   LABSPEC 1.036 (H) 09/26/2020 0442   PHURINE 6.0 09/26/2020 0442   GLUCOSEU NEGATIVE 09/26/2020 0442   HGBUR SMALL (A) 09/26/2020 0442   BILIRUBINUR NEGATIVE 09/26/2020 0442   KETONESUR NEGATIVE 09/26/2020 0442   PROTEINUR NEGATIVE 09/26/2020 0442   NITRITE NEGATIVE 09/26/2020 0442   LEUKOCYTESUR NEGATIVE 09/26/2020 0442   Sepsis Labs Invalid input(s): PROCALCITONIN,  WBC,  LACTICIDVEN Microbiology Recent Results (from the past 240 hour(s))  SARS CORONAVIRUS 2 (TAT 6-24 HRS) Nasopharyngeal Nasopharyngeal Swab     Status: None   Collection Time: 09/25/20  9:23 PM   Specimen: Nasopharyngeal Swab  Result Value Ref Range Status   SARS Coronavirus 2 NEGATIVE NEGATIVE Final    Comment: (NOTE) SARS-CoV-2 target nucleic acids are NOT DETECTED.  The SARS-CoV-2 RNA is generally detectable in upper and lower respiratory specimens during the acute phase of infection. Negative results do not preclude SARS-CoV-2 infection, do not rule out co-infections with other pathogens, and should not be used as the sole basis for treatment or other patient management decisions. Negative results must be combined with clinical observations, patient  history, and epidemiological information. The expected result is Negative.  Fact Sheet for Patients: SugarRoll.be  Fact Sheet for Healthcare Providers: https://www.woods-mathews.com/  This test is not yet approved or cleared by the [place] FDA and  has been authorized for detection and/or diagnosis of SARS-CoV-2 by FDA under an Emergency Use Authorization (EUA). This EUA will remain  in effect (meaning this test can be used) for the duration of the COVID-19 declaration under Se ction 564(b)(1) of the Act, 21 U.S.C. section 360bbb-3(b)(1), unless the authorization is terminated or revoked sooner.  Performed at [facility], [place] [address], [place], [place] [address]   MRSA PCR Screening     Status: None   Collection Time: 09/26/20  1:45 AM   Specimen: Nasal Mucosa; Nasopharyngeal  Result Value Ref Range Status   MRSA by PCR NEGATIVE NEGATIVE Final    Comment:        The GeneXpert MRSA Assay (FDA approved for NASAL specimens only), is one component of a comprehensive MRSA colonization surveillance program. It is not intended to diagnose MRSA infection nor to guide or monitor treatment for MRSA infections. Performed at [facility], [address] [place], [place] [address]   SARS Coronavirus 2 by RT PCR (hospital order, performed in [facility] hospital lab) Nasopharyngeal Nasopharyngeal Swab     Status: None   Collection Time: 09/26/20 10:01 AM   Specimen: Nasopharyngeal Swab  Result Value Ref Range Status   SARS Coronavirus 2 NEGATIVE NEGATIVE Final    Comment: (NOTE) SARS-CoV-2 target nucleic acids are NOT DETECTED.  The SARS-CoV-2 RNA is generally detectable in upper and lower respiratory specimens during the acute phase of infection. The lowest concentration of SARS-CoV-2 viral copies this assay can detect is 250 copies / mL. A negative result does not preclude SARS-CoV-2 infection and should not be used as the  sole basis for treatment or other patient management decisions.  A negative result may occur with improper specimen collection / handling, submission of specimen other than nasopharyngeal swab, presence of viral mutation(s) within the areas targeted by this assay, and inadequate number of viral copies (<250 copies / mL). A negative result must be combined with clinical observations, patient history, and epidemiological information.  Fact Sheet for Patients:   StrictlyIdeas.no  Fact Sheet for Healthcare Providers: BankingDealers.co.za  This test is not yet approved or  cleared by the [place] and has been authorized for detection and/or diagnosis of SARS-CoV-2 by FDA under an Emergency Use Authorization (EUA).  This EUA will remain in effect (meaning this test can be used) for the duration of the COVID-19 declaration under Section 564(b)(1) of the Act, 21 U.S.C. section 360bbb-3(b)(1), unless the authorization is terminated or revoked sooner.  Performed at [facility], [address], [place], [place] [address]      Time coordinating discharge: 58mins  SIGNED:   Kathie Dike, MD  [facility] 09/30/2020, 11:12 AM   If 7PM-7AM, please contact night-coverage [email or web address]

## 2020-09-30 NOTE — Progress Notes (Signed)
MD Zierle-Ghosh notified of patient BP 100/40 with a MAP of 57. No new orders at this time.

## 2020-09-30 NOTE — Progress Notes (Signed)
500 NS Bolus given.

## 2020-10-09 ENCOUNTER — Telehealth: Payer: Self-pay | Admitting: Orthopedic Surgery

## 2020-10-09 DIAGNOSIS — K921 Melena: Secondary | ICD-10-CM | POA: Insufficient documentation

## 2020-10-09 NOTE — Telephone Encounter (Addendum)
Mr. Tracy Castillo had an appointment to come in tomorrow, Tuesday, 10/10/20 for post reduction R shoulder dislocation that was done on 09/26/20.    Per [facility] facility staff, pt has been admitted to [facility] for GI bleed.   The nursing staff @ [facility] states they will call us back to reschedule this appointment.

## 2020-10-10 ENCOUNTER — Encounter: Payer: Medicare Other | Admitting: Orthopedic Surgery

## 2020-10-10 ENCOUNTER — Inpatient Hospital Stay (HOSPITAL_COMMUNITY)
Admission: AD | Admit: 2020-10-10 | Discharge: 2020-10-14 | DRG: 391 | Disposition: A | Discharge status: Skilled Nursing Facility | Payer: Medicare Other | Source: Other Acute Inpatient Hospital | Attending: Internal Medicine | Admitting: Internal Medicine

## 2020-10-10 DIAGNOSIS — K7460 Unspecified cirrhosis of liver: Secondary | ICD-10-CM | POA: Diagnosis present

## 2020-10-10 DIAGNOSIS — S2249XA Multiple fractures of ribs, unspecified side, initial encounter for closed fracture: Secondary | ICD-10-CM

## 2020-10-10 DIAGNOSIS — W19XXXD Unspecified fall, subsequent encounter: Secondary | ICD-10-CM | POA: Diagnosis present

## 2020-10-10 DIAGNOSIS — Z79890 Hormone replacement therapy (postmenopausal): Secondary | ICD-10-CM | POA: Diagnosis not present

## 2020-10-10 DIAGNOSIS — Z9841 Cataract extraction status, right eye: Secondary | ICD-10-CM | POA: Diagnosis not present

## 2020-10-10 DIAGNOSIS — H409 Unspecified glaucoma: Secondary | ICD-10-CM | POA: Diagnosis present

## 2020-10-10 DIAGNOSIS — Z9842 Cataract extraction status, left eye: Secondary | ICD-10-CM

## 2020-10-10 DIAGNOSIS — Z66 Do not resuscitate: Secondary | ICD-10-CM | POA: Diagnosis present

## 2020-10-10 DIAGNOSIS — E039 Hypothyroidism, unspecified: Secondary | ICD-10-CM | POA: Diagnosis present

## 2020-10-10 DIAGNOSIS — S43004D Unspecified dislocation of right shoulder joint, subsequent encounter: Secondary | ICD-10-CM | POA: Diagnosis not present

## 2020-10-10 DIAGNOSIS — S2241XD Multiple fractures of ribs, right side, subsequent encounter for fracture with routine healing: Secondary | ICD-10-CM | POA: Diagnosis not present

## 2020-10-10 DIAGNOSIS — Z7982 Long term (current) use of aspirin: Secondary | ICD-10-CM

## 2020-10-10 DIAGNOSIS — Z79899 Other long term (current) drug therapy: Secondary | ICD-10-CM | POA: Diagnosis not present

## 2020-10-10 DIAGNOSIS — Z85828 Personal history of other malignant neoplasm of skin: Secondary | ICD-10-CM | POA: Diagnosis not present

## 2020-10-10 DIAGNOSIS — F0390 Unspecified dementia without behavioral disturbance: Secondary | ICD-10-CM

## 2020-10-10 DIAGNOSIS — K449 Diaphragmatic hernia without obstruction or gangrene: Secondary | ICD-10-CM | POA: Diagnosis present

## 2020-10-10 DIAGNOSIS — K2900 Acute gastritis without bleeding: Principal | ICD-10-CM | POA: Diagnosis present

## 2020-10-10 DIAGNOSIS — Z961 Presence of intraocular lens: Secondary | ICD-10-CM | POA: Diagnosis present

## 2020-10-10 DIAGNOSIS — R1310 Dysphagia, unspecified: Secondary | ICD-10-CM | POA: Diagnosis present

## 2020-10-10 DIAGNOSIS — Z87891 Personal history of nicotine dependence: Secondary | ICD-10-CM | POA: Diagnosis not present

## 2020-10-10 DIAGNOSIS — S43014A Anterior dislocation of right humerus, initial encounter: Secondary | ICD-10-CM | POA: Diagnosis present

## 2020-10-10 DIAGNOSIS — K922 Gastrointestinal hemorrhage, unspecified: Secondary | ICD-10-CM

## 2020-10-10 DIAGNOSIS — Z20822 Contact with and (suspected) exposure to covid-19: Secondary | ICD-10-CM | POA: Diagnosis present

## 2020-10-10 DIAGNOSIS — N1832 Chronic kidney disease, stage 3b: Secondary | ICD-10-CM | POA: Diagnosis present

## 2020-10-10 DIAGNOSIS — I442 Atrioventricular block, complete: Secondary | ICD-10-CM | POA: Diagnosis present

## 2020-10-10 DIAGNOSIS — K31811 Angiodysplasia of stomach and duodenum with bleeding: Secondary | ICD-10-CM | POA: Diagnosis present

## 2020-10-10 DIAGNOSIS — D62 Acute posthemorrhagic anemia: Secondary | ICD-10-CM | POA: Diagnosis present

## 2020-10-10 DIAGNOSIS — Z95810 Presence of automatic (implantable) cardiac defibrillator: Secondary | ICD-10-CM

## 2020-10-10 DIAGNOSIS — K5731 Diverticulosis of large intestine without perforation or abscess with bleeding: Secondary | ICD-10-CM | POA: Diagnosis present

## 2020-10-10 DIAGNOSIS — R296 Repeated falls: Secondary | ICD-10-CM | POA: Diagnosis present

## 2020-10-10 DIAGNOSIS — K625 Hemorrhage of anus and rectum: Secondary | ICD-10-CM | POA: Insufficient documentation

## 2020-10-10 DIAGNOSIS — K2971 Gastritis, unspecified, with bleeding: Secondary | ICD-10-CM | POA: Diagnosis present

## 2020-10-10 DIAGNOSIS — S2243XD Multiple fractures of ribs, bilateral, subsequent encounter for fracture with routine healing: Secondary | ICD-10-CM | POA: Diagnosis not present

## 2020-10-10 HISTORY — DX: Presence of automatic (implantable) cardiac defibrillator: Z95.810

## 2020-10-10 HISTORY — DX: Presence of cardiac pacemaker: Z95.0

## 2020-10-10 MED ORDER — CHLORHEXIDINE GLUCONATE CLOTH 2 % EX PADS
6.0000 | MEDICATED_PAD | Freq: Every day | CUTANEOUS | Status: DC
  Administered 2020-10-12: 6 via TOPICAL

## 2020-10-10 NOTE — Telephone Encounter (Signed)
Patient's step son Aleen Sells called this morning.  He called to let us know about Mr. Gomes being in the hospital and wouldn't be coming to his appointment today.   His concern is that new x-rays done at [facility] showed a dislocation of the shoulder again.  He said he spoke to the nurse and she said that the doctor was going to let them know later this morning about how they were going to proceed.  I asked him to call us back and let us know what was going on and if there is anything that we can do to help.    He said he would do this.

## 2020-10-11 ENCOUNTER — Encounter (HOSPITAL_COMMUNITY): Payer: Self-pay | Admitting: Family Medicine

## 2020-10-11 ENCOUNTER — Inpatient Hospital Stay (HOSPITAL_COMMUNITY): Payer: Medicare Other

## 2020-10-11 DIAGNOSIS — K922 Gastrointestinal hemorrhage, unspecified: Secondary | ICD-10-CM

## 2020-10-11 DIAGNOSIS — F0390 Unspecified dementia without behavioral disturbance: Secondary | ICD-10-CM

## 2020-10-11 DIAGNOSIS — S2249XA Multiple fractures of ribs, unspecified side, initial encounter for closed fracture: Secondary | ICD-10-CM

## 2020-10-11 DIAGNOSIS — R1310 Dysphagia, unspecified: Secondary | ICD-10-CM

## 2020-10-11 LAB — CBC
HCT: 25 % — ABNORMAL LOW (ref 39.0–52.0)
HCT: 25.4 % — ABNORMAL LOW (ref 39.0–52.0)
Hemoglobin: 7.8 g/dL — ABNORMAL LOW (ref 13.0–17.0)
Hemoglobin: 8 g/dL — ABNORMAL LOW (ref 13.0–17.0)
MCH: 33.1 pg (ref 26.0–34.0)
MCH: 33.6 pg (ref 26.0–34.0)
MCHC: 30.7 g/dL (ref 30.0–36.0)
MCHC: 32 g/dL (ref 30.0–36.0)
MCV: 103.3 fL — ABNORMAL HIGH (ref 80.0–100.0)
MCV: 109.5 fL — ABNORMAL HIGH (ref 80.0–100.0)
Platelets: 183 10*3/uL (ref 150–400)
Platelets: 210 10*3/uL (ref 150–400)
RBC: 2.32 MIL/uL — ABNORMAL LOW (ref 4.22–5.81)
RBC: 2.42 MIL/uL — ABNORMAL LOW (ref 4.22–5.81)
RDW: 17.2 % — ABNORMAL HIGH (ref 11.5–15.5)
RDW: 17.2 % — ABNORMAL HIGH (ref 11.5–15.5)
WBC: 4.9 10*3/uL (ref 4.0–10.5)
WBC: 5.8 10*3/uL (ref 4.0–10.5)
nRBC: 0 % (ref 0.0–0.2)
nRBC: 0 % (ref 0.0–0.2)

## 2020-10-11 LAB — MRSA PCR SCREENING: MRSA by PCR: NEGATIVE

## 2020-10-11 LAB — BASIC METABOLIC PANEL
Anion gap: 6 (ref 5–15)
BUN: 35 mg/dL — ABNORMAL HIGH (ref 8–23)
CO2: 19 mmol/L — ABNORMAL LOW (ref 22–32)
Calcium: 7.8 mg/dL — ABNORMAL LOW (ref 8.9–10.3)
Chloride: 114 mmol/L — ABNORMAL HIGH (ref 98–111)
Creatinine, Ser: 1.19 mg/dL (ref 0.61–1.24)
GFR, Estimated: 57 mL/min — ABNORMAL LOW (ref >60)
Glucose, Bld: 87 mg/dL (ref 70–99)
Potassium: 4.6 mmol/L (ref 3.5–5.1)
Sodium: 139 mmol/L (ref 135–145)

## 2020-10-11 LAB — HEMOGLOBIN AND HEMATOCRIT, BLOOD
HCT: 24 % — ABNORMAL LOW (ref 39.0–52.0)
HCT: 25 % — ABNORMAL LOW (ref 39.0–52.0)
HCT: 23.5 % — ABNORMAL LOW (ref 39.0–52.0)
Hemoglobin: 7.3 g/dL — ABNORMAL LOW (ref 13.0–17.0)
Hemoglobin: 7.7 g/dL — ABNORMAL LOW (ref 13.0–17.0)
Hemoglobin: 7.5 g/dL — ABNORMAL LOW (ref 13.0–17.0)

## 2020-10-11 MED ORDER — TRAMADOL HCL 50 MG PO TABS
50.0000 mg | ORAL_TABLET | Freq: Four times a day (QID) | ORAL | Status: DC | PRN
Start: 2020-10-11 — End: 2020-10-14
  Administered 2020-10-11 – 2020-10-13 (×3): 50 mg via ORAL
  Filled 2020-10-11 (×3): qty 1

## 2020-10-11 MED ORDER — FOOD THICKENER (SIMPLYTHICK): 1.0000 | ORAL | Status: DC | PRN

## 2020-10-11 MED ORDER — SODIUM CHLORIDE 0.9 % IV SOLN
80.0000 mg | Freq: Once | INTRAVENOUS | Status: AC
  Administered 2020-10-11: 80 mg via INTRAVENOUS
  Filled 2020-10-11: qty 80

## 2020-10-11 MED ORDER — SODIUM CHLORIDE 0.9 % IV SOLN
8.0000 mg/h | INTRAVENOUS | Status: DC
  Administered 2020-10-11 – 2020-10-12 (×3): 8 mg/h via INTRAVENOUS
  Filled 2020-10-11 (×5): qty 80

## 2020-10-11 MED ORDER — QUETIAPINE FUMARATE 25 MG PO TABS
25.0000 mg | ORAL_TABLET | Freq: Every day | ORAL | Status: DC
  Administered 2020-10-11 – 2020-10-13 (×2): 25 mg via ORAL
  Filled 2020-10-11 (×3): qty 1

## 2020-10-11 MED ORDER — LEVOTHYROXINE SODIUM 100 MCG PO TABS
100.0000 ug | ORAL_TABLET | Freq: Every day | ORAL | Status: DC
  Administered 2020-10-11 – 2020-10-14 (×4): 100 ug via ORAL
  Filled 2020-10-11 (×4): qty 1

## 2020-10-11 MED ORDER — DONEPEZIL HCL 5 MG PO TABS
5.0000 mg | ORAL_TABLET | Freq: Every day | ORAL | Status: DC
  Administered 2020-10-11 – 2020-10-13 (×3): 5 mg via ORAL
  Filled 2020-10-11 (×4): qty 1

## 2020-10-11 MED ORDER — IOHEXOL 300 MG/ML  SOLN
75.0000 mL | Freq: Once | INTRAMUSCULAR | Status: AC | PRN
  Administered 2020-10-11: 75 mL via INTRAVENOUS

## 2020-10-11 MED ORDER — PANTOPRAZOLE SODIUM 40 MG IV SOLR: 40.0000 mg | Freq: Two times a day (BID) | INTRAVENOUS | Status: DC

## 2020-10-11 MED ORDER — TRAMADOL HCL 50 MG PO TABS
50.0000 mg | ORAL_TABLET | Freq: Every day | ORAL | Status: DC
Start: 2020-10-11 — End: 2020-10-11
  Filled 2020-10-11: qty 1

## 2020-10-11 MED ORDER — FERROUS SULFATE 325 (65 FE) MG PO TABS
325.0000 mg | ORAL_TABLET | Freq: Every day | ORAL | Status: DC
  Administered 2020-10-11 – 2020-10-13 (×3): 325 mg via ORAL
  Filled 2020-10-11 (×3): qty 1

## 2020-10-11 NOTE — TOC Initial Note (Signed)
Transition of Care [facility]) - Initial/Assessment Note    Patient Details  Name: Tracy Castillo MRN: [id number] Date of Birth: 11/19/1924  Transition of Care [facility]) CM/SW Contact:    Leeroy Cha, RN Phone Number: 10/11/2020, 9:09 AM  Clinical Narrative:                  87 y.o. male with medical history significant for complete heart block with pacemaker, dementia, hypothyroidism, glaucoma who is a transfer from [facility] to [place] for GI consultation.  Patient was admitted to [facility] on 10/09/2020 after he was noted at [facility] rehab with blood in bowel movement.  Initially thought to be diverticular bleed although no CT abdomen imaging was done.  Plan was conservative medical management with IV fluids monitoring of his H&H and transfusion if needed.  However later in the admission patient had several large body bowel movement and son is requesting to have patient transferred for GI consultation and possible intervention since that is not available at [facility].  Patient only on aspirin which has been discontinued since admission.  On my evaluation, patient resting comfortably with normal blood pressure and per nursing had another dark bowel movement on arrival.  Patient denies any abdominal pain.  He is able to tell me his name but otherwise it was difficult to understand his speech. PLSAN: to return to [facility] Expected Discharge Plan: [place] Barriers to Discharge: Continued Medical Work up   Patient Goals and CMS Choice Patient states their goals for this hospitalization and ongoing recovery are:: unable to state CMS Medicare.gov Compare Post Acute Care list provided to:: Patient    Expected Discharge Plan and Services Expected Discharge Plan: [place]   Discharge Planning Services: CM Consult Post Acute Care Choice: [place] Living arrangements for the past 2 months: [place]                                       Prior Living Arrangements/Services Living arrangements for the past 2 months: [place] Lives with:: Facility Resident Patient language and need for interpreter reviewed:: Yes Do you feel safe going back to the place where you live?: Yes      Need for Family Participation in Patient Care: Yes (Comment) Care giver support system in place?: Yes (comment)   Criminal Activity/Legal Involvement Pertinent to Current Situation/Hospitalization: No - Comment as needed  Activities of Daily Living      Permission Sought/Granted                  Emotional Assessment Appearance:: Appears stated age Attitude/Demeanor/Rapport: Unable to Assess Affect (typically observed): Unable to Assess Orientation: : Fluctuating Orientation (Suspected and/or reported Sundowners) Alcohol / Substance Use: Not Applicable Psych Involvement: No (comment)  Admission diagnosis:  Rectal bleeding [K62.5] Patient Active Problem List   Diagnosis Date Noted  . GI bleed 10/11/2020  . Dementia ([place]) 10/11/2020  . Dysphagia 10/11/2020  . Rectal bleeding 10/10/2020  . Esophageal hiatal hernia 09/26/2020  . Bilateral pleural effusion 09/26/2020  . Hypothyroidism 09/26/2020  . Glaucoma 09/26/2020  . Macrocytic anemia 09/26/2020  . Hypoalbuminemia 09/26/2020  . Chronic kidney disease 09/26/2020  . Anterior dislocation of right shoulder 09/25/2020  . Complete heart block ([place]) 09/04/2016  . Symptomatic bradycardia 09/03/2016  . Endophthalmitis, left eye 03/01/2016  . Endophthalmitis purulent 03/01/2016  PCP:  Waldon Reining, NP Pharmacy:   [id number] [place], [place] [address] Phone: [phone] Fax: [phone]     Social Determinants of Health (SDOH) Interventions    Readmission Risk Interventions Readmission Risk Prevention Plan 09/27/2020  Transportation Screening  Complete  Home Care Screening Complete  Medication Review (RN CM) Complete  Some recent data might be hidden

## 2020-10-11 NOTE — Consult Note (Signed)
Referring Provider:  Dr. Dwyane Dee Primary Care Physician:  Waldon Reining, NP Primary Gastroenterologist:  Althia Forts  Reason for Consultation:  Rectal bleeding  HPI: Tracy Castillo is a 87 y.o. male with past medical history pertinent for complete heart block s/p pacemaker, dementia, and hypothyroidism presenting for consultation of rectal bleeding.  Patient has dementia and is unable to provide any history.   Per chart review, patient was admitted to [facility] on 10/09/2020 after he was noted to have "significant amount of bleeding noticed by nursing home staff."  ED exam noted for significant amount of bright red blood per rectum.  Patient had ongoing rectal bleeding and was transferred for GI consultation.  Patient is on aspirin as an outpatient. No blood thinner use.  Per flow sheet, two black/brown BMs overnight.  Past Medical History:  Diagnosis Date  . Complete heart block (HCC)    a. s/p MDT dual chamber His Bundle pacemaker - Dr Lovena Le  . Dementia ([place])    "maybe a little; never told us exactly" (09/03/2016)  . Hypothyroidism   . Skin cancer of face     Past Surgical History:  Procedure Laterality Date  . APPENDECTOMY    . CATARACT EXTRACTION Left    "tried to put lens in but couldn't"  . CATARACT EXTRACTION W/ INTRAOCULAR LENS IMPLANT Right   . PACEMAKER IMPLANT N/A 09/04/2016   MDT dual chamber PPM with His Bundle pacing lead implanted by Dr Lovena Le for complete heart block  . PARS PLANA VITRECTOMY N/A 03/01/2016   Procedure: PARS PLANA VITRECTOMY 25 GAUGE FOR ENDOPHTHALMITIS;  Surgeon: Hayden Pedro, MD;  Location: [place];  Service: Ophthalmology;  Laterality: N/A;  . SHOULDER CLOSED REDUCTION Right 09/26/2020   Procedure: CLOSED REDUCTION SHOULDER DISLOCATION;  Surgeon: Mordecai Rasmussen, MD;  Location: [facility];  Service: Orthopedics;  Laterality: Right;  Requires paralysis  . SKIN CANCER EXCISION Left    face    Prior to Admission medications   Medication  Sig Start Date End Date Taking? Authorizing Provider  donepezil (ARICEPT) 5 MG tablet Take 5 mg by mouth at bedtime.   Yes [provider]  ferrous sulfate 325 (65 FE) MG tablet Take 325 mg by mouth daily. 09/22/20 09/22/21 Yes [provider]  levothyroxine (SYNTHROID) 100 MCG tablet Take 100 mcg by mouth daily. 07/19/20  Yes [provider]  pantoprazole (PROTONIX) 40 MG injection Inject 40 mg into the vein daily.   Yes [provider]  polyethylene glycol (MIRALAX / GLYCOLAX) 17 g packet Take 17 g by mouth daily. 09/30/20  Yes Kathie Dike, MD  QUEtiapine (SEROQUEL) 25 MG tablet Take 1 tablet (25 mg total) by mouth at bedtime. 09/28/20  Yes Bonnielee Haff, MD  senna (SENOKOT) 8.6 MG TABS tablet Take 1 tablet (8.6 mg total) by mouth at bedtime. Patient taking differently: Take 2 tablets by mouth at bedtime as needed for mild constipation. 09/28/20  Yes Bonnielee Haff, MD  feeding supplement (ENSURE ENLIVE / ENSURE PLUS) LIQD Take 237 mLs by mouth 2 (two) times daily between meals. 09/28/20   Bonnielee Haff, MD  traMADol (ULTRAM) 50 MG tablet Take 1 tablet (50 mg total) by mouth every 6 (six) hours as needed for moderate pain. 09/29/20   Kathie Dike, MD    Scheduled Meds: . Chlorhexidine Gluconate Cloth  6 each Topical Daily  . donepezil  5 mg Oral QHS  . ferrous sulfate  325 mg Oral Daily  . levothyroxine  100 mcg  Oral Daily  . [START ON 10/14/2020] pantoprazole  40 mg Intravenous Q12H  . QUEtiapine  25 mg Oral QHS  . traMADol  50 mg Oral Daily   Continuous Infusions: . pantoprozole (PROTONIX) infusion 8 mg/hr (10/11/20 0700)   PRN Meds:.  Allergies as of 10/10/2020  . (No Known Allergies)    Family History  Problem Relation Age of Onset  . Cancer - Other Sister        breast    Social History   Socioeconomic History  . Marital status: Divorced    Spouse name: Not on file  . Number of children: Not on file  . Years of education: Not on  file  . Highest education level: Not on file  Occupational History  . Not on file  Tobacco Use  . Smoking status: Former Smoker    Packs/day: 0.50    Years: 10.00    Pack years: 5.00    Types: Cigarettes  . Smokeless tobacco: Never Used  . Tobacco comment: quit smoking "in 1950"  Substance and Sexual Activity  . Alcohol use: Not on file    Comment: "red wine"  . Drug use: No  . Sexual activity: Not on file  Other Topics Concern  . Not on file  Social History Narrative  . Not on file   Social Determinants of Health   Financial Resource Strain: Not on file  Food Insecurity: Not on file  Transportation Needs: Not on file  Physical Activity: Not on file  Stress: Not on file  Social Connections: Not on file  Intimate Partner Violence: Not on file    Review of Systems: Review of Systems  Unable to perform ROS: Dementia    Physical Exam: Vital signs: Vitals:   10/11/20 0715 10/11/20 0800  BP:  (!) 133/37  Pulse:  77  Resp:  18  Temp: 97.7 F (36.5 C)   SpO2:  98%   Last BM Date: 10/10/20 Physical Exam Vitals reviewed.  Constitutional:      General: He is not in acute distress.    Appearance: He is underweight.  HENT:     Head: Normocephalic and atraumatic.     Nose: Nose normal. No congestion.     Mouth/Throat:     Mouth: Mucous membranes are moist.     Pharynx: Oropharynx is clear.  Eyes:     General: No scleral icterus.    Extraocular Movements: Extraocular movements intact.     Comments: Mild conjunctival pallor  Cardiovascular:     Rate and Rhythm: Normal rate and regular rhythm.  Pulmonary:     Effort: Pulmonary effort is normal. No respiratory distress.  Abdominal:     General: Abdomen is flat. Bowel sounds are normal. There is no distension.     Palpations: Abdomen is soft. There is no mass.     Tenderness: There is no abdominal tenderness. There is no guarding or rebound.     Hernia: No hernia is present.  Musculoskeletal:        General: No  swelling or tenderness.     Cervical back: Normal range of motion and neck supple.  Skin:    General: Skin is warm and dry.  Neurological:     Mental Status: Mental status is at baseline. He is lethargic and disoriented.  Psychiatric:        Mood and Affect: Mood normal.        Behavior: Behavior normal. Behavior is cooperative.     GI:  Lab Results: Recent Labs    10/11/20 0034 10/11/20 0316  WBC 5.8 4.9  HGB 8.0* 7.8*  HCT 25.0* 25.4*  PLT 183 210   BMET Recent Labs    10/11/20 0034  NA 139  K 4.6  CL 114*  CO2 19*  GLUCOSE 87  BUN 35*  CREATININE 1.19  CALCIUM 7.8*   LFT No results for input(s): PROT, ALBUMIN, AST, ALT, ALKPHOS, BILITOT, BILIDIR, IBILI in the last 72 hours. PT/INR No results for input(s): LABPROT, INR in the last 72 hours.   Studies/Results: No results found.  Impression: Anemia, rectal bleeding.  Per chart review, patient had large-volume bloody bowel movement, and ED physical exam noted large amount of red blood in the rectal vault.  This is most consistent with diverticular bleeding, though no notation of diverticula on CT 09/25/20.  Patient now having melenic bowel movements, likely passing old blood. -Hgb 8.0, decreased from 9.0 yesterday.  -Mildly elevated BUN, but presentation less consistent with upper GI bleeding  Dementia  Plan: Continue supportive care.  Continue Protonix for now.  Clear liquid diet.  If recurrent bleeding, consider CT-A vs. Nuclear bleeding scan.  Continue to monitor H&H with transfusion as needed to maintain Hgb >7.  [facility] will follow.   LOS: 1 day   Salley Slaughter  PA-C 10/11/2020, 8:44 AM  Contact #  [phone]

## 2020-10-11 NOTE — H&P (Addendum)
History and Physical    Tracy Castillo SAY:[id number] DOB: [birth year] DOA: 10/10/2020  PCP: Waldon Reining, NP  [facility] coming from: [facility] have personally briefly reviewed patient's old medical records in [place]  Chief Complaint: rectal bleed  HPI: Tracy Castillo is a 87 y.o. male with medical history significant for complete heart block with pacemaker, dementia, hypothyroidism, glaucoma who is a transfer from [facility] to [place] for GI consultation.  Patient was admitted to [facility] on 10/09/2020 after he was noted at [facility] rehab with blood in bowel movement.  Initially thought to be diverticular bleed although no CT abdomen imaging was done.  Plan was conservative medical management with IV fluids monitoring of his H&H and transfusion if needed.  However later in the admission patient had several large body bowel movement and son is requesting to have patient transferred for GI consultation and possible intervention since that is not available at [facility].  Patient only on aspirin which has been discontinued since admission.  On my evaluation, patient resting comfortably with normal blood pressure and per nursing had another dark bowel movement on arrival.  Patient denies any abdominal pain.  He is able to tell me his name but otherwise it was difficult to understand his speech.  Review of Systems: Unable to obtain since it was difficult to understand patient's speech  Past Medical History:  Diagnosis Date  . Complete heart block (HCC)    a. s/p MDT dual chamber His Bundle pacemaker - Dr Lovena Le  . Dementia ([place])    "maybe a little; never told us exactly" (09/03/2016)  . Hypothyroidism   . Skin cancer of face     Past Surgical History:  Procedure Laterality Date  . APPENDECTOMY    . CATARACT EXTRACTION Left    "tried to put lens in but couldn't"  . CATARACT EXTRACTION W/ INTRAOCULAR LENS IMPLANT Right   . PACEMAKER IMPLANT N/A  09/04/2016   MDT dual chamber PPM with His Bundle pacing lead implanted by Dr Lovena Le for complete heart block  . PARS PLANA VITRECTOMY N/A 03/01/2016   Procedure: PARS PLANA VITRECTOMY 25 GAUGE FOR ENDOPHTHALMITIS;  Surgeon: Hayden Pedro, MD;  Location: [facility];  Service: Ophthalmology;  Laterality: N/A;  . SHOULDER CLOSED REDUCTION Right 09/26/2020   Procedure: CLOSED REDUCTION SHOULDER DISLOCATION;  Surgeon: Mordecai Rasmussen, MD;  Location: [facility];  Service: Orthopedics;  Laterality: Right;  Requires paralysis  . SKIN CANCER EXCISION Left    face     reports that he has quit smoking. His smoking use included cigarettes. He has a 5.00 pack-year smoking history. He has never used smokeless tobacco. He reports that he does not use drugs. No history on file for alcohol use. Social History  No Known Allergies  Family History  Problem Relation Age of Onset  . Cancer - Other Sister        breast     Prior to Admission medications   Medication Sig Start Date End Date Taking? Authorizing Provider  acetaminophen (TYLENOL) 325 MG tablet Take 650 mg by mouth every 6 (six) hours as needed for mild pain.    [provider]  aspirin 81 MG EC tablet Take 81 mg by mouth daily. Patient not taking: Reported on 09/25/2020    [provider]  dorzolamide (TRUSOPT) 2 % ophthalmic solution Place 1 drop into the left eye 3 (three) times daily. Patient not taking: No sig reported 03/02/16  Hayden Pedro, MD  feeding supplement (ENSURE ENLIVE / ENSURE PLUS) LIQD Take 237 mLs by mouth 2 (two) times daily between meals. 09/28/20   Bonnielee Haff, MD  ferrous sulfate 325 (65 FE) MG tablet Take 1 tablet by mouth daily. 09/22/20 09/22/21  [provider]  levothyroxine (SYNTHROID) 100 MCG tablet Take 100 mcg by mouth daily. 07/19/20   [provider]  polyethylene glycol (MIRALAX / GLYCOLAX) 17 g packet Take 17 g by mouth daily. 09/30/20   Kathie Dike, MD  QUEtiapine (SEROQUEL)  25 MG tablet Take 1 tablet (25 mg total) by mouth at bedtime. 09/28/20   Bonnielee Haff, MD  senna (SENOKOT) 8.6 MG TABS tablet Take 1 tablet (8.6 mg total) by mouth at bedtime. 09/28/20   Bonnielee Haff, MD  traMADol (ULTRAM) 50 MG tablet Take 1 tablet (50 mg total) by mouth every 6 (six) hours as needed for moderate pain. 09/29/20   Kathie Dike, MD    Physical Exam: Vitals:   10/10/20 2257 10/10/20 2300 10/11/20 0000  BP: (!) 149/52 (!) 128/22 (!) 135/28  Pulse: 92 92 84  Resp: 16 16 14   Temp: 99 F (37.2 C)    TempSrc: Axillary    SpO2:  100% 100%  Weight: 59.7 kg      Constitutional: NAD, calm, comfortable thin elderly gentleman laying comfortably asleep in bed Vitals:   10/10/20 2257 10/10/20 2300 10/11/20 0000  BP: (!) 149/52 (!) 128/22 (!) 135/28  Pulse: 92 92 84  Resp: 16 16 14   Temp: 99 F (37.2 C)    TempSrc: Axillary    SpO2:  100% 100%  Weight: 59.7 kg     Eyes: PERRL, lids and conjunctivae normal ENMT: Mucous membranes are moist.  Neck: normal, supple Respiratory: clear to auscultation bilaterally, no wheezing, no crackles. Normal respiratory effort. No accessory muscle use.  Cardiovascular: Regular rate and rhythm, no murmurs / rubs / gallops. No extremity edema.   Abdomen: no tenderness, no masses palpated.  Bowel sounds positive.  Musculoskeletal: no clubbing / cyanosis. No joint deformity upper and lower extremities. Good ROM, no contractures. Normal muscle tone.  Skin: no rashes, lesions, ulcers. No induration Neurologic: Alert and awoke easily to voice.  Speech is difficult to understand. CN 2-12 grossly intact. Sensation intact,Strength 5/5 in all 4.  Psychiatric: Normal judgment and insight. Alert and oriented x 3. Normal mood.    Labs on Admission: I have personally reviewed following labs and imaging studies  CBC: Recent Labs  Lab 10/11/20 0034  WBC 5.8  HGB 8.0*  HCT 25.0*  MCV 103.3*  PLT 244   Basic Metabolic Panel: Recent Labs  Lab  10/11/20 0034  NA 139  K 4.6  CL 114*  CO2 19*  GLUCOSE 87  BUN 35*  CREATININE 1.19  CALCIUM 7.8*   GFR: Estimated Creatinine Clearance: 32.7 mL/min (by C-G formula based on SCr of 1.19 mg/dL). Liver Function Tests: No results for input(s): AST, ALT, ALKPHOS, BILITOT, PROT, ALBUMIN in the last 168 hours. No results for input(s): LIPASE, AMYLASE in the last 168 hours. No results for input(s): AMMONIA in the last 168 hours. Coagulation Profile: No results for input(s): INR, PROTIME in the last 168 hours. Cardiac Enzymes: No results for input(s): CKTOTAL, CKMB, CKMBINDEX, TROPONINI in the last 168 hours. BNP (last 3 results) No results for input(s): PROBNP in the last 8760 hours. HbA1C: No results for input(s): HGBA1C in the last 72 hours. CBG: No results for input(s): GLUCAP in  the last 168 hours. Lipid Profile: No results for input(s): CHOL, HDL, LDLCALC, TRIG, CHOLHDL, LDLDIRECT in the last 72 hours. Thyroid Function Tests: No results for input(s): TSH, T4TOTAL, FREET4, T3FREE, THYROIDAB in the last 72 hours. Anemia Panel: No results for input(s): VITAMINB12, FOLATE, FERRITIN, TIBC, IRON, RETICCTPCT in the last 72 hours. Urine analysis:    Component Value Date/Time   COLORURINE YELLOW 09/26/2020 0442   APPEARANCEUR CLEAR 09/26/2020 0442   LABSPEC 1.036 (H) 09/26/2020 0442   PHURINE 6.0 09/26/2020 0442   GLUCOSEU NEGATIVE 09/26/2020 0442   HGBUR SMALL (A) 09/26/2020 0442   BILIRUBINUR NEGATIVE 09/26/2020 0442   KETONESUR NEGATIVE 09/26/2020 0442   PROTEINUR NEGATIVE 09/26/2020 0442   NITRITE NEGATIVE 09/26/2020 0442   LEUKOCYTESUR NEGATIVE 09/26/2020 0442    Radiological Exams on Admission: No results found.    Assessment/Plan  GI bleed -Initially had large bloody bowel movements with clots at [facility].  However noted to have more melanotic stool here on arrival. - Start IV Protonix drip since he has component of lower and possible upper GI bleed -  Stat hemoglobin obtained here is at 8 with patient having a baseline of 9 - Repeat hemoglobin again in the morning.  Transfusion threshold of less than 7. - Need GI consult in the morning  AKI resolved - Patient had AKI with creatinine up to 1.67 in [place].  Repeat creatinine here shows that it has resolved  Dementia - Patient able to tell me his name but otherwise difficult to understand his speech  Dysphagia - Patient documented to have underwent modified barium swallow with aspiration.  Keep n.p.o. for now pending speech therapy.  Hypothyroidism - Continue levothyroxine  Hx of complete heart block -has pacemaker  DVT prophylaxis:.SCD Code Status: DNR/DNI Family Communication: No family at bedside  disposition Plan: Home with at least 2 midnight stays  Consults called:  Admission status: inpatient  Level of care: Stepdown  Status is: Inpatient  Remains inpatient appropriate because:Inpatient level of care appropriate due to severity of illness   Dispo: The patient is from: [facility] rehab              Anticipated d/c is to: [facility] rehab              Patient currently is not medically stable to d/c.   Difficult to place patient No         Orene Desanctis DO [facility]   If 7PM-7AM, please contact night-coverage [email or web address]   10/11/2020, 1:12 AM

## 2020-10-11 NOTE — Evaluation (Signed)
Clinical/Bedside Swallow Evaluation Patient Details  Name: Tracy Castillo MRN: [id number] Date of Birth: 1925/04/06  Today's Date: 10/11/2020 Time: SLP Start Time (ACUTE ONLY): 1155 SLP Stop Time (ACUTE ONLY): 1213 SLP Time Calculation (min) (ACUTE ONLY): 18 min  Past Medical History:  Past Medical History:  Diagnosis Date  . Complete heart block (HCC)    a. s/p MDT dual chamber His Bundle pacemaker - Dr Lovena Le  . Dementia ([place])    "maybe a little; never told us exactly" (09/03/2016)  . Hypothyroidism   . Skin cancer of face    Past Surgical History:  Past Surgical History:  Procedure Laterality Date  . APPENDECTOMY    . CATARACT EXTRACTION Left    "tried to put lens in but couldn't"  . CATARACT EXTRACTION W/ INTRAOCULAR LENS IMPLANT Right   . PACEMAKER IMPLANT N/A 09/04/2016   MDT dual chamber PPM with His Bundle pacing lead implanted by Dr Lovena Le for complete heart block  . PARS PLANA VITRECTOMY N/A 03/01/2016   Procedure: PARS PLANA VITRECTOMY 25 GAUGE FOR ENDOPHTHALMITIS;  Surgeon: Hayden Pedro, MD;  Location: [facility];  Service: Ophthalmology;  Laterality: N/A;  . SHOULDER CLOSED REDUCTION Right 09/26/2020   Procedure: CLOSED REDUCTION SHOULDER DISLOCATION;  Surgeon: Mordecai Rasmussen, MD;  Location: [facility];  Service: Orthopedics;  Laterality: Right;  Requires paralysis  . SKIN CANCER EXCISION Left    face   HPI:  Tracy Castillo is a 87 y.o. male admitted from [facility] with concerns for GI bleed.  Pt with PMH + for significant for complete heart block with pacemaker, dementia, hypothyroidism, glaucoma, right shoulder dislocation after fall at the end of April.   Swallow eval ordered as RN states pt's daughter advised that pt is to be on thickened liquids.  Pt imaging study showed moderate esophageal hiatal hernia, small bilaeral pleural effusion with bilateral ATX.  GI wrote for pt to start clear liquid diet. Cervical spine image showed osteophytosis most significant in the lower cervical   spine at C5-C6 and C6-C7.  NO prior swallow evaluations completed at [facility].   Assessment / Plan / Recommendation Clinical Impression  Pt seen with po as GI had written order for clear liquids.  SlP provided oral care to pt - having him brush his teeth and set up oral suction for use.  Pt's tongue has black coating - suspect due to GI bleed.  Pt currently presents with clinical indications consistent with moderate oropharyngeal dysphagia.  Per RN, daughter stated pt was receiving nectar thick liquids at SNF.  SLP observed pt consuming only clears due to GI bleed.  Jello x 2 boluses, nectar thick applejuice via tsp x3, cup x2 and straw x3 provided with pt self feeding using his left hand.  Clinically pt appears with delayed swallow followed by cough post-swallow of nectar via cup/straw bolus but NOT with-tsp amounts. Pt appears dysarthric but also is [place] thus notable accent present. Slight left facial asymmetry noted = decreased labionasal fold but otherwise no focal CN deficits apparent.  At this time, recommend continue clears - nectar thick and medication with jello or nectar.  SLP will follow up for dysphagia management.  Advised RN to recommendations and posted swallow precautions signs. SLP Visit Diagnosis: Dysphagia, unspecified (R13.10)    Aspiration Risk  Moderate aspiration risk    Diet Recommendation Other (Comment) (nectar clears per GI)   Medication Administration: Other (Comment) (consider crushed with jello if large and approved) Compensations: Slow rate;Small sips/bites Postural Changes:  Seated upright at 90 degrees;Remain upright for at least 30 minutes after po intake    Other  Recommendations Oral Care Recommendations: Oral care BID Other Recommendations: Order thickener from pharmacy;Have oral suction available   Follow up Recommendations        Frequency and Duration min 2x/week  1 week       Prognosis Prognosis for Safe Diet Advancement: Fair Barriers to  Reach Goals: Time post onset      Swallow Study   General Date of Onset: 10/11/20 HPI: Tracy Castillo is a 87 y.o. male admitted from [facility] with concerns for GI bleed.  Pt with PMH + for significant for complete heart block with pacemaker, dementia, hypothyroidism, glaucoma, right shoulder dislocation after fall at the end of April.   Swallow eval ordered as RN states pt's daughter advised that pt is to be on thickened liquids.  Pt imaging study showed moderate esophageal hiatal hernia, small bilaeral pleural effusion with bilateral ATX.  GI wrote for pt to start clear liquid diet. Cervical spine image showed osteophytosis most significant in the lower cervical  spine at C5-C6 and C6-C7.  NO prior swallow evaluations completed at [facility]. Type of Study: Bedside Swallow Evaluation Diet Prior to this Study: Nectar-thick liquids (clears) Temperature Spikes Noted: No Respiratory Status: Room air History of Recent Intubation: No Behavior/Cognition: Alert;Cooperative;Pleasant mood Oral Cavity Assessment: Other (comment) (pt's tongue appears with dark black coating midline- ? due to h/o GI bleed) Oral Care Completed by SLP: Yes Oral Cavity - Dentition: Adequate natural dentition;Other (Comment);Missing dentition Vision: Functional for self-feeding Self-Feeding Abilities: Able to feed self Patient Positioning: Upright in bed Baseline Vocal Quality: Normal Volitional Cough: Weak Volitional Swallow: Unable to elicit    Oral/Motor/Sensory Function Overall Oral Motor/Sensory Function: Mild impairment Facial Symmetry: Abnormal symmetry left;Suspected CN VII (facial) dysfunction Lingual Strength: Reduced Velum: Within Functional Limits Mandible: Within Functional Limits   Ice Chips Ice chips: Not tested   Thin Liquid Thin Liquid: Not tested    Nectar Thick Nectar Thick Liquid: Impaired Presentation: Cup;Spoon;Straw Pharyngeal Phase Impairments: Cough - Immediate;Cough - Delayed;Suspected  delayed Swallow   Honey Thick Honey Thick Liquid: Not tested   Puree Puree: Impaired Pharyngeal Phase Impairments: Suspected delayed Swallow   Solid     Solid: Not tested Other Comments: pt on clear liquid diet      Macario Golds 10/11/2020,1:01 PM   Kathleen Lime, MS [place] Office [phone] Pager [phone]

## 2020-10-11 NOTE — Progress Notes (Addendum)
Progress Note    Ras Kollman   XQJ:[id number]  DOB: 05-28-1925  DOA: 10/10/2020     1  PCP: Waldon Reining, NP  CC: rectal bleeding  Hospital Course: Tison Leibold is a 87 y.o. male with medical history significant for complete heart block with pacemaker, dementia, hypothyroidism, glaucoma who is a transfer from [facility] to [place] for GI consultation.  Patient was admitted to [facility] on 10/09/2020 after he was noted at [facility] rehab with blood in bowel movement.  Initially thought to be diverticular bleed although no CT abdomen imaging was done.  Plan was conservative medical management with IV fluids monitoring of his H&H and transfusion if needed.  However later in the admission patient had several large body bowel movement and son requested to have patient transferred for GI consultation and possible intervention since that is not available at [facility].  Patient only on aspirin which has been discontinued since admission.  Interval History:  Dark stools noted overnight per staff.  Does have difficult speech but was able to understand most of what he was saying this morning.  ROS: Review of systems not obtained due to patient factors. Some cognitive impairment   Assessment & Plan:  GI bleed - reported to have red bloody BMs with clots at [facility] - history of known diverticulosis - no CT performed; patient transferred for GI workup - continue PPI - Hgb 7.8 g/dL this am; down to 7.3 g/dL this afternoon - trend H/H - appreciate GI assistance; follow up CT A/P - transfuse for Hgb < 7 g/dL  AKI - resolved - creat up to 1.67 at [facility] - now has resolved  Dementia - oriented to name  Dysphagia - s/p SLP eval  - puree with nectar thick liq at home per family as well from last evaluation  Hypothyroidism - Continue levothyroxine  Hx of complete heart block -has pacemaker  Recent right shoulder dislocation - Reduced with  orthopedic surgery on 09/26/2020 - Continue sling to right upper extremity - PT ordered  Recent falls - Chest imaging also shows rib fractures to the right 12th and left 7th ribs - Continue tramadol and supportive care  Old records reviewed in assessment of this patient   DVT prophylaxis: SCDs Start: 10/10/20 2341   Code Status:   Code Status: DNR Family Communication:   Disposition Plan: Status is: Inpatient  Remains inpatient appropriate because:Ongoing diagnostic testing needed not appropriate for outpatient work up, IV treatments appropriate due to intensity of illness or inability to take PO and Inpatient level of care appropriate due to severity of illness   Dispo: The patient is from: SNF              Anticipated d/c is to: SNF              Patient currently is not medically stable to d/c.   Difficult to place patient No      Risk of unplanned readmission score: Unplanned Admission- Pilot do not use: 17.11   Objective: Blood pressure 115/66, pulse 74, temperature (!) 97.3 F (36.3 C), temperature source Oral, resp. rate 13, weight 59.7 kg, SpO2 100 %.  Examination: General appearance: Chronically ill-appearing elderly gentleman resting in bed in no distress with difficult understand speech Head: Normocephalic, without obvious abnormality, atraumatic Eyes: EOMI Lungs: clear to auscultation bilaterally Heart: regular rate and rhythm and S1, S2 normal Abdomen: TTP nonspecifically, no R/G; BS present Extremities: no edema Skin: mobility and turgor  normal Neurologic: follows commands, moves all 4 extremities   Consultants:   GI  Procedures:     Data Reviewed: I have personally reviewed following labs and imaging studies Results for orders placed or performed during the hospital encounter of 10/10/20 (from the past 24 hour(s))  MRSA PCR Screening     Status: None   Collection Time: 10/10/20 11:39 PM   Specimen: Nasopharyngeal  Result Value Ref Range    MRSA by PCR NEGATIVE NEGATIVE  Basic metabolic panel     Status: Abnormal   Collection Time: 10/11/20 12:34 AM  Result Value Ref Range   Sodium 139 135 - 145 mmol/L   Potassium 4.6 3.5 - 5.1 mmol/L   Chloride 114 (H) 98 - 111 mmol/L   CO2 19 (L) 22 - 32 mmol/L   Glucose, Bld 87 70 - 99 mg/dL   BUN 35 (H) 8 - 23 mg/dL   Creatinine, Ser 1.19 0.61 - 1.24 mg/dL   Calcium 7.8 (L) 8.9 - 10.3 mg/dL   GFR, Estimated 57 (L) >60 mL/min   Anion gap 6 5 - 15  CBC     Status: Abnormal   Collection Time: 10/11/20 12:34 AM  Result Value Ref Range   WBC 5.8 4.0 - 10.5 K/uL   RBC 2.42 (L) 4.22 - 5.81 MIL/uL   Hemoglobin 8.0 (L) 13.0 - 17.0 g/dL   HCT 25.0 (L) 39.0 - 52.0 %   MCV 103.3 (H) 80.0 - 100.0 fL   MCH 33.1 26.0 - 34.0 pg   MCHC 32.0 30.0 - 36.0 g/dL   RDW 17.2 (H) 11.5 - 15.5 %   Platelets 183 150 - 400 K/uL   nRBC 0.0 0.0 - 0.2 %  CBC     Status: Abnormal   Collection Time: 10/11/20  3:16 AM  Result Value Ref Range   WBC 4.9 4.0 - 10.5 K/uL   RBC 2.32 (L) 4.22 - 5.81 MIL/uL   Hemoglobin 7.8 (L) 13.0 - 17.0 g/dL   HCT 25.4 (L) 39.0 - 52.0 %   MCV 109.5 (H) 80.0 - 100.0 fL   MCH 33.6 26.0 - 34.0 pg   MCHC 30.7 30.0 - 36.0 g/dL   RDW 17.2 (H) 11.5 - 15.5 %   Platelets 210 150 - 400 K/uL   nRBC 0.0 0.0 - 0.2 %  Hemoglobin and hematocrit, blood     Status: Abnormal   Collection Time: 10/11/20  1:45 PM  Result Value Ref Range   Hemoglobin 7.3 (L) 13.0 - 17.0 g/dL   HCT 24.0 (L) 39.0 - 52.0 %    Recent Results (from the past 240 hour(s))  MRSA PCR Screening     Status: None   Collection Time: 10/10/20 11:39 PM   Specimen: Nasopharyngeal  Result Value Ref Range Status   MRSA by PCR NEGATIVE NEGATIVE Final    Comment:        The GeneXpert MRSA Assay (FDA approved for NASAL specimens only), is one component of a comprehensive MRSA colonization surveillance program. It is not intended to diagnose MRSA infection nor to guide or monitor treatment for MRSA  infections. Performed at [facility], [place] [address] [place], [place] [address]      Radiology Studies: No results found. CT ABDOMEN PELVIS W CONTRAST    (Results Pending)    Scheduled Meds: . Chlorhexidine Gluconate Cloth  6 each Topical Daily  . donepezil  5 mg Oral QHS  . ferrous sulfate  325 mg Oral  Daily  . levothyroxine  100 mcg Oral QAC breakfast  . [START ON 10/14/2020] pantoprazole  40 mg Intravenous Q12H  . QUEtiapine  25 mg Oral QHS   PRN Meds: food thickener, traMADol Continuous Infusions: . pantoprozole (PROTONIX) infusion 8 mg/hr (10/11/20 1423)     LOS: 1 day  Time spent: Greater than 50% of the 35 minute visit was spent in counseling/coordination of care for the patient as laid out in the A&P.   Dwyane Dee, MD [facility] 10/11/2020, 3:44 PM

## 2020-10-11 NOTE — Progress Notes (Signed)
Orthopedic Tech Progress Note Patient Details:  Tracy Castillo 09-Nov-1924 [id number]  Ortho Devices Type of Ortho Device: Arm sling Ortho Device/Splint Location: Right Arm Ortho Device/Splint Interventions: Application   Post Interventions Patient Tolerated: Well   Linus Salmons Reet Scharrer 10/11/2020, 8:08 PM

## 2020-10-11 NOTE — TOC Progression Note (Signed)
Transition of Care [facility]) - Progression Note    Patient Details  Name: Tracy Castillo MRN: [id number] Date of Birth: 1924/12/20  [facility]) CM/SW Contact  Tracy Cha, RN Phone Number: 10/11/2020, 9:18 AM  Clinical Narrative:    Tracy Castillo and sent to [facility] for possible return to snf.   Expected Discharge Plan: [place] Barriers to Discharge: Continued Medical Work up  Expected Discharge Plan and Services Expected Discharge Plan: [place]   Discharge Planning Services: CM Consult Post Acute Care Choice: [place] Living arrangements for the past 2 months: [place]                                       Social Determinants of Health (SDOH) Interventions    Readmission Risk Interventions Readmission Risk Prevention Plan 09/27/2020  Transportation Screening Complete  Home Care Screening Complete  Medication Review (RN CM) Complete  Some recent data might be hidden

## 2020-10-11 NOTE — NC FL2 (Signed)
[place] LEVEL OF CARE SCREENING TOOL     IDENTIFICATION  Patient Name: Tracy Castillo Birthdate: Oct 11, 1924 Sex: male Admission Date (Current Location): 10/10/2020  [facility] and [place] Number:  Engineer, manufacturing systems and Address:  [facility],  [place] [address] [place]      Provider Number: [id number]  Attending Physician Name and Address:  Dwyane Dee, MD  Relative Name and Phone Number:       Current Level of Care: Hospital Recommended Level of Care: [place] Prior Approval Number:    Date Approved/Denied:   PASRR Number: [id number]  Discharge Plan: SNF    Current Diagnoses: Patient Active Problem List   Diagnosis Date Noted  . GI bleed 10/11/2020  . Dementia ([place]) 10/11/2020  . Dysphagia 10/11/2020  . Rectal bleeding 10/10/2020  . Esophageal hiatal hernia 09/26/2020  . Bilateral pleural effusion 09/26/2020  . Hypothyroidism 09/26/2020  . Glaucoma 09/26/2020  . Macrocytic anemia 09/26/2020  . Hypoalbuminemia 09/26/2020  . Chronic kidney disease 09/26/2020  . Anterior dislocation of right shoulder 09/25/2020  . Complete heart block ([place]) 09/04/2016  . Symptomatic bradycardia 09/03/2016  . Endophthalmitis, left eye 03/01/2016  . Endophthalmitis purulent 03/01/2016    Orientation RESPIRATION BLADDER Height & Weight     Self,Situation,Time,Place  Normal Continent Weight: 59.7 kg Height:     BEHAVIORAL SYMPTOMS/MOOD NEUROLOGICAL BOWEL NUTRITION STATUS      Continent Diet (regular)  AMBULATORY STATUS COMMUNICATION OF NEEDS Skin   Extensive Assist Verbally Normal                       Personal Care Assistance Level of Assistance  Bathing,Feeding,Dressing Bathing Assistance: Limited assistance Feeding assistance: Limited assistance Dressing Assistance: Limited assistance     Functional Limitations Info  Sight,Hearing,Speech Sight Info: Adequate Hearing Info: Adequate Speech Info: Adequate     SPECIAL CARE FACTORS FREQUENCY  PT (By licensed PT),OT (By licensed OT)     PT Frequency: 5 x weekly OT Frequency: 5 x weekly            Contractures Contractures Info: Not present    Additional Factors Info  Code Status Code Status Info: DNR             Current Medications (10/11/2020):  This is the current hospital active medication list Current Facility-Administered Medications  Medication Dose Route Frequency Provider Last Rate Last Admin  . Chlorhexidine Gluconate Cloth 2 % PADS 6 each  6 each Topical Daily Tu, Ching T, DO      . donepezil (ARICEPT) tablet 5 mg  5 mg Oral QHS Tu, Ching T, DO      . ferrous sulfate tablet 325 mg  325 mg Oral Daily Tu, Ching T, DO      . levothyroxine (SYNTHROID) tablet 100 mcg  100 mcg Oral Daily Tu, Ching T, DO      . pantoprazole (PROTONIX) 80 mg in sodium chloride 0.9 % 100 mL (0.8 mg/mL) infusion  8 mg/hr Intravenous Continuous Tu, Ching T, DO 10 mL/hr at 10/11/20 0700 8 mg/hr at 10/11/20 0700  . [START ON 10/14/2020] pantoprazole (PROTONIX) injection 40 mg  40 mg Intravenous Q12H Tu, Ching T, DO      . QUEtiapine (SEROQUEL) tablet 25 mg  25 mg Oral QHS Tu, Ching T, DO      . traMADol (ULTRAM) tablet 50 mg  50 mg Oral Daily Tu, Ching T, DO  Discharge Medications: Please see discharge summary for a list of discharge medications.  Relevant Imaging Results:  Relevant Lab Results:   Additional Information SSN:[id number]  Leeroy Cha, RN

## 2020-10-11 NOTE — Progress Notes (Signed)
Talked with patient's son, Corene Cornea.  Discussed recommendations to start with CT of the abdomen to evaluate for sources of bleeding (ischemic colitis, etc), and deferring colonoscopy at this time, as it would be difficult for patient to consume prep, and patient would also be at higher risk of anesthesia-related complications due to age.  Son is in agreement to proceed with CT and continue supportive care.  Corene Cornea (son) also informed me that patient is on Pureed diet with nectar-thick liquids.  Has had difficulty chewing and swallowing foods and underwent speech evaluation with the aforementioned recommendations. No structural defect/stricture to son's knowledge.

## 2020-10-12 LAB — BASIC METABOLIC PANEL
Anion gap: 6 (ref 5–15)
BUN: 32 mg/dL — ABNORMAL HIGH (ref 8–23)
CO2: 21 mmol/L — ABNORMAL LOW (ref 22–32)
Calcium: 8 mg/dL — ABNORMAL LOW (ref 8.9–10.3)
Chloride: 115 mmol/L — ABNORMAL HIGH (ref 98–111)
Creatinine, Ser: 1.48 mg/dL — ABNORMAL HIGH (ref 0.61–1.24)
GFR, Estimated: 44 mL/min — ABNORMAL LOW (ref >60)
Glucose, Bld: 71 mg/dL (ref 70–99)
Potassium: 3.8 mmol/L (ref 3.5–5.1)
Sodium: 142 mmol/L (ref 135–145)

## 2020-10-12 LAB — HEMOGLOBIN AND HEMATOCRIT, BLOOD
HCT: 23.2 % — ABNORMAL LOW (ref 39.0–52.0)
HCT: 24.2 % — ABNORMAL LOW (ref 39.0–52.0)
Hemoglobin: 7.5 g/dL — ABNORMAL LOW (ref 13.0–17.0)
Hemoglobin: 7.6 g/dL — ABNORMAL LOW (ref 13.0–17.0)

## 2020-10-12 LAB — MAGNESIUM: Magnesium: 2.1 mg/dL (ref 1.7–2.4)

## 2020-10-12 MED ORDER — POLYETHYLENE GLYCOL 3350 17 G PO PACK
17.0000 g | PACK | Freq: Every day | ORAL | Status: DC
  Administered 2020-10-12 – 2020-10-14 (×2): 17 g via ORAL
  Filled 2020-10-12 (×3): qty 1

## 2020-10-12 MED ORDER — SODIUM CHLORIDE 0.9 % IV SOLN
2.0000 g | INTRAVENOUS | Status: DC
  Administered 2020-10-12 – 2020-10-13 (×2): 2 g via INTRAVENOUS
  Filled 2020-10-12: qty 20
  Filled 2020-10-12 (×2): qty 2

## 2020-10-12 MED ORDER — SENNOSIDES-DOCUSATE SODIUM 8.6-50 MG PO TABS
1.0000 | ORAL_TABLET | Freq: Two times a day (BID) | ORAL | Status: DC
  Administered 2020-10-12 – 2020-10-14 (×2): 1 via ORAL
  Filled 2020-10-12 (×3): qty 1

## 2020-10-12 MED ORDER — PANTOPRAZOLE SODIUM 40 MG IV SOLR
40.0000 mg | Freq: Two times a day (BID) | INTRAVENOUS | Status: DC
  Administered 2020-10-12 – 2020-10-14 (×4): 40 mg via INTRAVENOUS
  Filled 2020-10-12 (×4): qty 40

## 2020-10-12 NOTE — Evaluation (Signed)
Physical Therapy Evaluation Patient Details Name: Tracy Castillo MRN: [id number] DOB: Jul 21, 1924 Today's Date: 10/12/2020   History of Present Illness  87 y.o. male who is a transfer from [facility] to [place] for GI consultation due to GI bleed.   Past medical history significant for complete heart block with pacemaker, dementia, hypothyroidism, glaucoma and recent right shoulder dislocation and reduction  Clinical Impression  Pt admitted with above diagnosis.  Pt currently with functional limitations due to the deficits listed below (see PT Problem List). Pt will benefit from skilled PT to increase their independence and safety with mobility to allow discharge to the venue listed below.   Pt agreeable to sit and stand however fatigues quickly.  Pt attempting to forearm prop on R UE and required frequent cues for NWB.  Pt requiring max assist for mobility at this time.  Recommend return to SNF upon d/c.      Follow Up Recommendations SNF    Equipment Recommendations  None recommended by PT    Recommendations for Other Services       Precautions / Restrictions Precautions Precautions: Fall;ICD/Pacemaker Shoulder Interventions: Shoulder sling/immobilizer Precaution Comments: NWB R UE; prone to skin tearing Required Braces or Orthoses: Sling Restrictions Weight Bearing Restrictions: Yes RUE Weight Bearing: Non weight bearing      Mobility  Bed Mobility Overal bed mobility: Needs Assistance Bed Mobility: Supine to Sit;Sit to Supine     Supine to sit: Mod assist Sit to supine: Max assist;+2 for physical assistance   General bed mobility comments: pt initiating movement, able to bring LEs over EOB however requiring assist for upper body due to right UE in sling; assist for upper and lower body upon return to bed    Transfers Overall transfer level: Needs assistance Equipment used: 1 person hand held assist Transfers: Sit to/from Stand Sit to Stand: Max assist;Mod  assist         General transfer comment: verbal cues for technique, keeping R UE NWB, blocked pt's left knee and provided HHA for L UE; nurse tech assisted with changing linen and pericare; sit to stand x2  Ambulation/Gait                Stairs            Wheelchair Mobility    Modified Rankin (Stroke Patients Only)       Balance Overall balance assessment: History of Falls Sitting-balance support: Single extremity supported;Feet supported Sitting balance-Leahy Scale: Poor Sitting balance - Comments: reliant on UE support   Standing balance support: Single extremity supported;During functional activity Standing balance-Leahy Scale: Zero                               Pertinent Vitals/Pain Pain Assessment: Faces Faces Pain Scale: Hurts little more Pain Location: R shoulder Pain Descriptors / Indicators: Grimacing;Guarding Pain Intervention(s): Repositioned;Monitored during session    Home Living Family/patient expects to be discharged to:: Skilled nursing facility                 Additional Comments: Pt came from [facility] per chart review.    Prior Function Level of Independence: Needs assistance   Gait / Transfers Assistance Needed: Pt unable to report history. Assist likely needed for transfers and ambulation likely at prior rehab facility.  ADL's / Homemaking Assistance Needed: Likely needed assist at prior facility.        Hand Dominance  Extremity/Trunk Assessment   Upper Extremity Assessment Upper Extremity Assessment: RUE deficits/detail RUE Deficits / Details: Post Closed right shoulder dislocation; NWB in R UE; pt R UE place in sling.    Lower Extremity Assessment Lower Extremity Assessment: Generalized weakness       Communication   Communication: Expressive difficulties;Receptive difficulties  Cognition Arousal/Alertness: Awake/alert Behavior During Therapy: Impulsive;Flat affect Overall  Cognitive Status: History of cognitive impairments - at baseline                                 General Comments: hx dementia, dysarthria, performs better with simple cues and yes/no answers      General Comments      Exercises     Assessment/Plan    PT Assessment Patient needs continued PT services  PT Problem List Decreased strength;Decreased activity tolerance;Decreased balance;Decreased mobility;Decreased coordination;Decreased knowledge of use of DME;Decreased skin integrity       PT Treatment Interventions DME instruction;Gait training;Functional mobility training;Therapeutic activities;Therapeutic exercise;Balance training;Patient/family education    PT Goals (Current goals can be found in the Care Plan section)  Acute Rehab PT Goals PT Goal Formulation: With patient Time For Goal Achievement: 10/26/20 Potential to Achieve Goals: Fair    Frequency Min 2X/week   Barriers to discharge        Co-evaluation               AM-PAC PT "6 Clicks" Mobility  Outcome Measure Help needed turning from your back to your side while in a flat bed without using bedrails?: Total Help needed moving from lying on your back to sitting on the side of a flat bed without using bedrails?: Total Help needed moving to and from a bed to a chair (including a wheelchair)?: Total Help needed standing up from a chair using your arms (e.g., wheelchair or bedside chair)?: Total Help needed to walk in hospital room?: Total Help needed climbing 3-5 steps with a railing? : Total 6 Click Score: 6    End of Session Equipment Utilized During Treatment: Gait belt Activity Tolerance: Patient limited by fatigue Patient left: in bed;with call bell/phone within reach;with bed alarm set;with nursing/sitter in room Nurse Communication: Mobility status PT Visit Diagnosis: Unsteadiness on feet (R26.81);Other abnormalities of gait and mobility (R26.89);Muscle weakness (generalized)  (M62.81)    Time: 9604-5409 PT Time Calculation (min) (ACUTE ONLY): 17 min   Charges:   PT Evaluation $PT Eval Moderate Complexity: 1 Mod         Kati PT, DPT Acute Rehabilitation Services Pager: [phone] Office: [phone]  York Ram E 10/12/2020, 1:18 PM

## 2020-10-12 NOTE — H&P (View-Only) (Signed)
[facility] Gastroenterology Progress Note  Tracy Castillo 87 y.o. [birth year]  CC:  Rectal bleeding, anemia  Subjective: Patient sleeping comfortably. Denies abdominal pain.  Per flow sheet, patient has been having mushy black/brown stools.  ROS : Review of Systems  Unable to perform ROS: Dementia    Objective: Vital signs in last 24 hours: Vitals:   10/12/20 0543 10/12/20 0615  BP: (!) 103/21 (!) 150/35  Pulse: 73   Resp: 20   Temp: 98.7 F (37.1 C)   SpO2: 98%     Physical Exam:  General:  Sleeping comfortably  Head:  Normocephalic, without obvious abnormality, atraumatic  Mouth: Tongue with black coating  Eyes:  Mild conjunctival pallor, EOMs intact   Lungs:   Clear to auscultation bilaterally, respirations unlabored  Heart:  Regular rate and rhythm, S1, S2 normal  Abdomen:   Soft, non-tender, non-distended, bowel sounds active all four quadrants  Extremities: Extremities normal, atraumatic, no  edema  Pulses: 2+ and symmetric    Lab Results: Recent Labs    10/11/20 0034 10/12/20 0341  NA 139 142  K 4.6 3.8  CL 114* 115*  CO2 19* 21*  GLUCOSE 87 71  BUN 35* 32*  CREATININE 1.19 1.48*  CALCIUM 7.8* 8.0*  MG  --  2.1   No results for input(s): AST, ALT, ALKPHOS, BILITOT, PROT, ALBUMIN in the last 72 hours. Recent Labs    10/11/20 0034 10/11/20 0316 10/11/20 1345 10/11/20 2156 10/12/20 0341  WBC 5.8 4.9  --   --   --   HGB 8.0* 7.8*   < > 7.5* 7.5*  HCT 25.0* 25.4*   < > 23.5* 23.2*  MCV 103.3* 109.5*  --   --   --   PLT 183 210  --   --   --    < > = values in this interval not displayed.   No results for input(s): LABPROT, INR in the last 72 hours.    Assessment: Anemia, rectal bleeding.  Per chart review, patient had large-volume bloody bowel movement, and ED physical exam noted large amount of red blood in the rectal vault.  This is most consistent with diverticular bleeding, sigmoid diverticulosis on CT 10/11/20.  Patient now having melenic bowel  movements, likely passing old blood. -Hgb 7.5, decreased from 8.0  -Cirrhosis noted on CT 10/11/20 presentation less consistent with brisk upper GI bleeding but this cannot be ruled out, and patient does have dark black coating on tongue (old blood?)  Dementia  Plan: EGD tomorrow with Dr. Michail Sermon to assess for upper GI sources of bleeding, given cirrhosis on CT imaging.  I thoroughly discussed the procedure with the patient's son, Corene Cornea via phone [phone]) to include nature, alternatives (supportive care, medical management), benefits (possibly identifying a source of bleeding, ruling out upper GI bleeding as source of bleeding), and risks (including but not limited to bleeding, infection, perforation, anesthesia/cardiac and pulmonary complications).  Patient's son Corene Cornea verbalized understanding and gave verbal consent to proceed with EGD.  Clear nectar-thick liquids, NPO at midnight.  Continue supportive care.  Continue Protonix.  Clear liquid diet.  If destabilizing bleeding, consider CT-A vs. Nuclear bleeding scan.  Continue to monitor H&H with transfusion as needed to maintain Hgb >7.  [facility] will follow.  Salley Slaughter PA-C 10/12/2020, 8:57 AM  Contact #  [phone]

## 2020-10-12 NOTE — TOC Progression Note (Addendum)
Transition of Care [facility]) - Progression Note    Patient Details  Name: Tracy Castillo MRN: [id number] Date of Birth: 10-Apr-1925  Transition of Care [facility]) CM/SW Contact  Ross Ludwig, [place] Phone Number: 10/12/2020, 5:07 PM  Clinical Narrative:     Patient is short term rehab patient at [facility].  Patient can return once medically ready for discharge pending a new covid test per SNF.   Expected Discharge Plan: [place] Barriers to Discharge: Continued Medical Work up  Expected Discharge Plan and Services Expected Discharge Plan: [place]   Discharge Planning Services: CM Consult Post Acute Care Choice: [place] Living arrangements for the past 2 months: [place]                                       Social Determinants of Health (SDOH) Interventions    Readmission Risk Interventions Readmission Risk Prevention Plan 09/27/2020  Transportation Screening Complete  Home Care Screening Complete  Medication Review (RN CM) Complete  Some recent data might be hidden

## 2020-10-12 NOTE — Progress Notes (Signed)
Progress Note    Tracy Castillo   GDJ:[id number]  DOB: 1924-07-29  DOA: 10/10/2020     2  PCP: Waldon Reining, NP  CC: rectal bleeding  Hospital Course: Tracy Castillo is a 87 y.o. male with medical history significant for complete heart block with pacemaker, dementia, hypothyroidism, glaucoma who is a transfer from [facility] to [place] for GI consultation.  Patient was admitted to [facility] on 10/09/2020 after he was noted at [facility] rehab with blood in bowel movement.  Initially thought to be diverticular bleed although no CT abdomen imaging was done.  Plan was conservative medical management with IV fluids monitoring of his H&H and transfusion if needed.  However later in the admission patient had several large body bowel movement and son requested to have patient transferred for GI consultation and possible intervention since that is not available at [facility].  Patient only on aspirin which has been discontinued since admission.  Interval History:  Granddaughter present bedside this morning.  Update given and questions answered.  We had a very extensive discussion regarding his general quality of life and his overall progressive decline over the last several months.  She states that her dad (patient's son) has some denial regarding the decline of the patient recently.  She understands his quality of life has continued to suffer and it is unclear if patient will be able to return to his prior functional status but certainly there is a low chance at this time. Tentative plan is for EGD tomorrow with GI.  ROS: Review of systems not obtained due to patient factors. Some cognitive impairment   Assessment & Plan:  GI bleed - reported to have red bloody BMs with clots at [facility] - history of known diverticulosis - continue PPI -Hemoglobin mostly stable hovering around mid 7s - trend H/H - appreciate GI assistance - transfuse for Hgb < 7 g/dL -  tentative plan is for EGD on 5/13 to evaluate for varices and PUD  AKI - resolved - creat up to 1.67 at [facility] - now has resolved  Dementia - oriented to name - today speaking in [place] mostly  Dysphagia - s/p SLP eval  - puree with nectar thick liq at home per family as well from last evaluation  Hypothyroidism - Continue levothyroxine  Hx of complete heart block -has pacemaker  Recent right shoulder dislocation - Reduced with orthopedic surgery on 09/26/2020 - Continue sling to right upper extremity - PT ordered  Recent falls Rib fractures - Chest imaging also shows rib fractures to the right 12th and left 7th ribs - Continue tramadol and supportive care  Old records reviewed in assessment of this patient   DVT prophylaxis: SCDs Start: 10/10/20 2341   Code Status:   Code Status: DNR Family Communication:   Disposition Plan: Status is: Inpatient  Remains inpatient appropriate because:Ongoing diagnostic testing needed not appropriate for outpatient work up, IV treatments appropriate due to intensity of illness or inability to take PO and Inpatient level of care appropriate due to severity of illness   Dispo: The patient is from: SNF              Anticipated d/c is to: SNF              Patient currently is not medically stable to d/c.   Difficult to place patient No  Risk of unplanned readmission score: Unplanned Admission- Pilot do not use: 19.08   Objective: Blood pressure (!) 122/55,  pulse 71, temperature 97.6 F (36.4 C), temperature source Oral, resp. rate 16, weight 61.7 kg, SpO2 97 %.  Examination: General appearance: Chronically ill-appearing elderly gentleman resting in bed in no distress with difficult understand speech Head: Normocephalic, without obvious abnormality, atraumatic Eyes: EOMI Lungs: clear to auscultation bilaterally Heart: regular rate and rhythm and S1, S2 normal Abdomen: TTP nonspecifically, no R/G; BS present Extremities: no  edema Skin: mobility and turgor normal Neurologic: follows commands, moves all 4 extremities   Consultants:   GI  Procedures:     Data Reviewed: I have personally reviewed following labs and imaging studies Results for orders placed or performed during the hospital encounter of 10/10/20 (from the past 24 hour(s))  Hemoglobin and hematocrit, blood     Status: Abnormal   Collection Time: 10/11/20  5:00 PM  Result Value Ref Range   Hemoglobin 7.7 (L) 13.0 - 17.0 g/dL   HCT 25.0 (L) 39.0 - 52.0 %  Hemoglobin and hematocrit, blood     Status: Abnormal   Collection Time: 10/11/20  9:56 PM  Result Value Ref Range   Hemoglobin 7.5 (L) 13.0 - 17.0 g/dL   HCT 23.5 (L) 39.0 - 52.0 %  Hemoglobin and hematocrit, blood     Status: Abnormal   Collection Time: 10/12/20  3:41 AM  Result Value Ref Range   Hemoglobin 7.5 (L) 13.0 - 17.0 g/dL   HCT 23.2 (L) 39.0 - 76.1 %  Basic metabolic panel     Status: Abnormal   Collection Time: 10/12/20  3:41 AM  Result Value Ref Range   Sodium 142 135 - 145 mmol/L   Potassium 3.8 3.5 - 5.1 mmol/L   Chloride 115 (H) 98 - 111 mmol/L   CO2 21 (L) 22 - 32 mmol/L   Glucose, Bld 71 70 - 99 mg/dL   BUN 32 (H) 8 - 23 mg/dL   Creatinine, Ser 1.48 (H) 0.61 - 1.24 mg/dL   Calcium 8.0 (L) 8.9 - 10.3 mg/dL   GFR, Estimated 44 (L) >60 mL/min   Anion gap 6 5 - 15  Magnesium     Status: None   Collection Time: 10/12/20  3:41 AM  Result Value Ref Range   Magnesium 2.1 1.7 - 2.4 mg/dL    Recent Results (from the past 240 hour(s))  MRSA PCR Screening     Status: None   Collection Time: 10/10/20 11:39 PM   Specimen: Nasopharyngeal  Result Value Ref Range Status   MRSA by PCR NEGATIVE NEGATIVE Final    Comment:        The GeneXpert MRSA Assay (FDA approved for NASAL specimens only), is one component of a comprehensive MRSA colonization surveillance program. It is not intended to diagnose MRSA infection nor to guide or monitor treatment for MRSA  infections. Performed at [facility], [place] [address] [place], [place] [address]      Radiology Studies: CT ABDOMEN PELVIS W CONTRAST  Result Date: 10/11/2020 CLINICAL DATA:  88 year old male with abdominal pain. Concern for GI bleed. EXAM: CT ABDOMEN AND PELVIS WITH CONTRAST TECHNIQUE: Multidetector CT imaging of the abdomen and pelvis was performed using the standard protocol following bolus administration of intravenous contrast. CONTRAST:  64mL OMNIPAQUE IOHEXOL 300 MG/ML  SOLN COMPARISON:  CT abdomen pelvis dated 09/25/2020. FINDINGS: Evaluation is limited due to streak artifact caused by patient's arms. Lower chest: The visualized lung bases are clear. Advanced 3 vessel coronary vascular calcification and calcification of the mitral annulus. Partially visualized  pacemaker wires. No intra-abdominal free air or free fluid. Hepatobiliary: Irregular liver contour in keeping with cirrhosis. No intrahepatic biliary dilatation. Multiple stones in the gallbladder. No pericholecystic fluid or evidence of acute cholecystitis by CT. Pancreas: Unremarkable. No pancreatic ductal dilatation or surrounding inflammatory changes. Spleen: Normal in size without focal abnormality. Adrenals/Urinary Tract: The adrenal glands unremarkable. There is no hydronephrosis on either side. There is symmetric enhancement and excretion of contrast by both kidneys. There is a 3 cm left renal interpolar exophytic cyst. The visualized ureters are unremarkable. There is mild trabeculated appearance of the bladder wall, likely related to chronic bladder outlet obstruction. Stomach/Bowel: There is a small hiatal hernia. There is moderate stool throughout the colon. Dense oral contrast with associated streak artifact noted in the rectal vault. Evaluation for GI bleed is very limited, nondiagnostic, on the CT due to presence of oral contrast. There is sigmoid diverticulosis without active inflammatory changes. There is no  bowel obstruction. The appendix is not visualized with certainty. No inflammatory changes identified in the right lower quadrant. Vascular/Lymphatic: Advanced aortoiliac atherosclerotic disease. There is a 2.3 cm infrarenal aortic ectasia. The IVC is unremarkable. No portal venous gas. There is no adenopathy. Reproductive: The prostate gland is grossly unremarkable. Other: None Musculoskeletal: Osteopenia with degenerative changes of the spine. No acute osseous pathology. IMPRESSION: 1. Sigmoid diverticulosis. No bowel obstruction or active inflammation. 2. Cirrhosis. 3. Cholelithiasis. 4. Aortic Atherosclerosis (ICD10-I70.0). Electronically Signed   By: Anner Crete M.D.   On: 10/11/2020 20:35   CT ABDOMEN PELVIS W CONTRAST  Final Result      Scheduled Meds: . Chlorhexidine Gluconate Cloth  6 each Topical Daily  . donepezil  5 mg Oral QHS  . ferrous sulfate  325 mg Oral Daily  . levothyroxine  100 mcg Oral QAC breakfast  . pantoprazole (PROTONIX) IV  40 mg Intravenous BID  . polyethylene glycol  17 g Oral Daily  . QUEtiapine  25 mg Oral QHS  . senna-docusate  1 tablet Oral BID   PRN Meds: food thickener, traMADol Continuous Infusions: . cefTRIAXone (ROCEPHIN)  IV 2 g (10/12/20 1424)     LOS: 2 days  Time spent: Greater than 50% of the 35 minute visit was spent in counseling/coordination of care for the patient as laid out in the A&P.   Dwyane Dee, MD [facility] 10/12/2020, 3:35 PM

## 2020-10-12 NOTE — Progress Notes (Signed)
Notified provider on call about patient's diastolic blood pressures. MD is already aware of this.

## 2020-10-12 NOTE — Progress Notes (Addendum)
  Speech Language Pathology Treatment: Dysphagia  Patient Details Name: Tracy Castillo MRN: [id number] DOB: Jan 07, 1925 Today's Date: 10/12/2020 Time: 8101-7510 SLP Time Calculation (min) (ACUTE ONLY): 28 min  Assessment / Plan / Recommendation Clinical Impression  Granddaughter Tracy Castillo present and reports she stays wtih pt at times and thus can provide some of his history.  In SLP's discussion with granddaughter, pt underwent swallow evaluation during prior hospitalization in April 2022 after recent fall and choking episode in restaurant. This SLP could not locate evaluating SLP documentation however it appears he had a clinical swallow evaluation and not instrumental evaluation.  Pt left the hospital on a puree/nectar thick liquid diet. Tracy Castillo reports pt has been dysarthric for the last week - concerning for oral manipulation of boluses.    SLP advised her to swallow evaluation conducted yesterday and concerns for aspiration risk - especially with cup or straw boluses of nectar thick evidenced by cough post-swallow.  She admits pt was coughing today after liquids via straw.    Reviewed precautions to mitigate risk including ceasing po if coughing, cue to cough strongly and re-swallow.  Given care plan includes endoscopy tomorrow, recommend continue only NECTAR CLEARS VIA TSP = NO CUP or STRAW.  Discussed pt's other risk factors for aspiration/dysphagia including cervical osteophytes- C5-C6, C6-C7 as well as hiatal hernia.     SLP concerned re: prognosis for swallow ability to return to functional level.  Set up suction for use if indicated. Will follow up. Recommend to consider palliative consult to establish [name] on this sweet 87 yo with multiple hospital admissions and progressive decline.  Of note, per granddaughter pt does not tolerate Seroquel well as it makes him sleep all day.     HPI HPI: Tracy Castillo is a 87 y.o. male admitted from [facility] with concerns for GI bleed.  Pt with PMH + for  significant for complete heart block with pacemaker, dementia, hypothyroidism, glaucoma, right shoulder dislocation after fall at the end of April.   Swallow eval ordered as RN states pt's daughter advised that pt is to be on thickened liquids.  Pt imaging study showed moderate esophageal hiatal hernia, small bilaeral pleural effusion with bilateral ATX.  GI wrote for pt to start clear liquid diet. Cervical spine image showed osteophytosis most significant in the lower cervical  spine at C5-C6 and C6-C7.  NO prior swallow evaluations completed at [facility].  IN discussion with granddaughter, pt underwent swallow evaluation during prior hospitalization. This SLP could not locate evaluating SLP documentation however it appears he had a clinical swallow evaluation and not instrumental evaluation.  Pt left the hospital on a puree/nectar thick liquid diet.      SLP Plan  Continue with current plan of care       Recommendations  Diet recommendations: Nectar-thick liquid Liquids provided via: Teaspoon Supervision: Full supervision/cueing for compensatory strategies Compensations: Slow rate;Small sips/bites;Other (Comment) Postural Changes and/or Swallow Maneuvers: Seated upright 90 degrees;Upright 30-60 min after meal                Oral Care Recommendations: Oral care BID SLP Visit Diagnosis: Dysphagia, unspecified (R13.10) Plan: Continue with current plan of care       Tracy Castillo, Tracy Castillo 10/12/2020, 12:00 PM Tracy Lime, MS [facility] SLP Acute Rehab Services Office [phone] Pager [phone]

## 2020-10-12 NOTE — Progress Notes (Signed)
[facility] Progress Note  Tracy Castillo 87 y.o. 09/27/1924  CC:  Rectal bleeding, anemia  Subjective: Patient sleeping comfortably. Denies abdominal pain.  Per flow sheet, patient has been having mushy black/brown stools.  ROS : Review of Systems  Unable to perform ROS: Dementia    Objective: Vital signs in last 24 hours: Vitals:   10/12/20 0543 10/12/20 0615  BP: (!) 103/21 (!) 150/35  Pulse: 73   Resp: 20   Temp: 98.7 F (37.1 C)   SpO2: 98%     Physical Exam:  General:  Sleeping comfortably  Head:  Normocephalic, without obvious abnormality, atraumatic  Mouth: Tongue with black coating  Eyes:  Mild conjunctival pallor, EOMs intact   Lungs:   Clear to auscultation bilaterally, respirations unlabored  Heart:  Regular rate and rhythm, S1, S2 normal  Abdomen:   Soft, non-tender, non-distended, bowel sounds active all four quadrants  Extremities: Extremities normal, atraumatic, no  edema  Pulses: 2+ and symmetric    Lab Results: Recent Labs    10/11/20 0034 10/12/20 0341  NA 139 142  K 4.6 3.8  CL 114* 115*  CO2 19* 21*  GLUCOSE 87 71  BUN 35* 32*  CREATININE 1.19 1.48*  CALCIUM 7.8* 8.0*  MG  --  2.1   No results for input(s): AST, ALT, ALKPHOS, BILITOT, PROT, ALBUMIN in the last 72 hours. Recent Labs    10/11/20 0034 10/11/20 0316 10/11/20 1345 10/11/20 2156 10/12/20 0341  WBC 5.8 4.9  --   --   --   HGB 8.0* 7.8*   < > 7.5* 7.5*  HCT 25.0* 25.4*   < > 23.5* 23.2*  MCV 103.3* 109.5*  --   --   --   PLT 183 210  --   --   --    < > = values in this interval not displayed.   No results for input(s): LABPROT, INR in the last 72 hours.    Assessment: Anemia, rectal bleeding.  Per chart review, patient had large-volume bloody bowel movement, and ED physical exam noted large amount of red blood in the rectal vault.  This is most consistent with diverticular bleeding, sigmoid diverticulosis on CT 10/11/20.  Patient now having melenic bowel  movements, likely passing old blood. -Hgb 7.5, decreased from 8.0  -Cirrhosis noted on CT 10/11/20 presentation less consistent with brisk upper GI bleeding but this cannot be ruled out, and patient does have dark black coating on tongue (old blood?)  Dementia  Plan: EGD tomorrow with Dr. Schooler to assess for upper GI sources of bleeding, given cirrhosis on CT imaging.  I thoroughly discussed the procedure with the patient's son, Tracy Castillo via phone ([phone]) to include nature, alternatives (supportive care, medical management), benefits (possibly identifying a source of bleeding, ruling out upper GI bleeding as source of bleeding), and risks (including but not limited to bleeding, infection, perforation, anesthesia/cardiac and pulmonary complications).  Patient's son Tracy Castillo verbalized understanding and gave verbal consent to proceed with EGD.  Clear nectar-thick liquids, NPO at midnight.  Continue supportive care.  Continue Protonix.  Clear liquid diet.  If destabilizing bleeding, consider CT-A vs. Nuclear bleeding scan.  Continue to monitor H&H with transfusion as needed to maintain Hgb >7.  [facility] will follow.  Franko Hilliker Baron-Johnson PA-C 10/12/2020, 8:57 AM  Contact #  [phone] 

## 2020-10-12 NOTE — Plan of Care (Signed)
  Problem: Clinical Measurements: Goal: Respiratory complications will improve Outcome: Progressing   Problem: Education: Goal: Knowledge of General Education information will improve Description: Including pain rating scale, medication(s)/side effects and non-pharmacologic comfort measures Outcome: Progressing   Problem: Clinical Measurements: Goal: Respiratory complications will improve Outcome: Progressing   Problem: Coping: Goal: Level of anxiety will decrease Outcome: Progressing   Problem: Elimination: Goal: Will not experience complications related to urinary retention Outcome: Progressing   Problem: Pain Managment: Goal: General experience of comfort will improve Outcome: Progressing   Problem: Safety: Goal: Ability to remain free from injury will improve Outcome: Progressing   Problem: Skin Integrity: Goal: Risk for impaired skin integrity will decrease Outcome: Progressing

## 2020-10-13 ENCOUNTER — Other Ambulatory Visit: Payer: Self-pay

## 2020-10-13 ENCOUNTER — Encounter (HOSPITAL_COMMUNITY)
Admission: AD | Disposition: A | Discharge status: Skilled Nursing Facility | Payer: Self-pay | Source: Other Acute Inpatient Hospital | Attending: Internal Medicine

## 2020-10-13 ENCOUNTER — Encounter (HOSPITAL_COMMUNITY): Payer: Self-pay | Admitting: Family Medicine

## 2020-10-13 ENCOUNTER — Inpatient Hospital Stay (HOSPITAL_COMMUNITY): Payer: Medicare Other | Admitting: Certified Registered"

## 2020-10-13 DIAGNOSIS — S2243XD Multiple fractures of ribs, bilateral, subsequent encounter for fracture with routine healing: Secondary | ICD-10-CM

## 2020-10-13 HISTORY — PX: ESOPHAGOGASTRODUODENOSCOPY: SHX5428

## 2020-10-13 LAB — BASIC METABOLIC PANEL
Anion gap: 7 (ref 5–15)
BUN: 29 mg/dL — ABNORMAL HIGH (ref 8–23)
CO2: 22 mmol/L (ref 22–32)
Calcium: 8.2 mg/dL — ABNORMAL LOW (ref 8.9–10.3)
Chloride: 115 mmol/L — ABNORMAL HIGH (ref 98–111)
Creatinine, Ser: 1.45 mg/dL — ABNORMAL HIGH (ref 0.61–1.24)
GFR, Estimated: 45 mL/min — ABNORMAL LOW (ref >60)
Glucose, Bld: 80 mg/dL (ref 70–99)
Potassium: 4 mmol/L (ref 3.5–5.1)
Sodium: 144 mmol/L (ref 135–145)

## 2020-10-13 LAB — HEMOGLOBIN AND HEMATOCRIT, BLOOD
HCT: 23.9 % — ABNORMAL LOW (ref 39.0–52.0)
HCT: 24.3 % — ABNORMAL LOW (ref 39.0–52.0)
Hemoglobin: 7.4 g/dL — ABNORMAL LOW (ref 13.0–17.0)
Hemoglobin: 7.6 g/dL — ABNORMAL LOW (ref 13.0–17.0)

## 2020-10-13 LAB — MAGNESIUM: Magnesium: 2.1 mg/dL (ref 1.7–2.4)

## 2020-10-13 LAB — RESP PANEL BY RT-PCR (FLU A&B, COVID) ARPGX2
Influenza A by PCR: NEGATIVE
Influenza B by PCR: NEGATIVE
SARS Coronavirus 2 by RT PCR: NEGATIVE

## 2020-10-13 SURGERY — EGD (ESOPHAGOGASTRODUODENOSCOPY)
Anesthesia: Monitor Anesthesia Care

## 2020-10-13 MED ORDER — PNEUMOCOCCAL VAC POLYVALENT 25 MCG/0.5ML IJ INJ
0.5000 mL | INJECTION | INTRAMUSCULAR | Status: DC
  Filled 2020-10-13: qty 0.5

## 2020-10-13 MED ORDER — PROPOFOL 10 MG/ML IV BOLUS
INTRAVENOUS | Status: AC
  Filled 2020-10-13: qty 20

## 2020-10-13 MED ORDER — PROPOFOL 500 MG/50ML IV EMUL
INTRAVENOUS | Status: DC | PRN
  Administered 2020-10-13: 65 ug/kg/min via INTRAVENOUS

## 2020-10-13 MED ORDER — SODIUM CHLORIDE 0.9 % IV SOLN: INTRAVENOUS | Status: DC | PRN

## 2020-10-13 MED ORDER — PROPOFOL 10 MG/ML IV BOLUS
INTRAVENOUS | Status: DC | PRN
  Administered 2020-10-13: 5 mg via INTRAVENOUS

## 2020-10-13 MED ORDER — BOOST / RESOURCE BREEZE PO LIQD CUSTOM
1.0000 | Freq: Three times a day (TID) | ORAL | Status: DC
  Administered 2020-10-13 – 2020-10-14 (×3): 1 via ORAL

## 2020-10-13 MED ORDER — PROPOFOL 500 MG/50ML IV EMUL
INTRAVENOUS | Status: AC
  Filled 2020-10-13: qty 50

## 2020-10-13 NOTE — Progress Notes (Signed)
Notified on call provider to clarify NPO order due to patient needing to take Synthroid. On call provider gave an order for NPO except sips with meds. Also notified on call provider that Seroquel was held due to patient being lethargic/fatigued throughout the day.

## 2020-10-13 NOTE — Transfer of Care (Signed)
Immediate Anesthesia Transfer of Care Note  Patient: Tracy Castillo  Procedure(s) Performed: ESOPHAGOGASTRODUODENOSCOPY (EGD) (N/A )  Patient Location: PACU  Anesthesia Type:MAC  Level of Consciousness: awake and alert   Airway & Oxygen Therapy: Patient Spontanous Breathing and Patient connected to face mask oxygen  Post-op Assessment: Report given to RN, Post -op Vital signs reviewed and stable and Patient moving all extremities X 4  Post vital signs: Reviewed and stable  Last Vitals:  Vitals Value Taken Time  BP 117/52 10/13/20 1013  Temp    Pulse    Resp 23 10/13/20 1014  SpO2    Vitals shown include unvalidated device data.  Last Pain:  Vitals:   10/13/20 0856  TempSrc: Oral  PainSc: 0-No pain      Patients Stated Pain Goal: 3 (83/38/25 0539)  Complications: No complications documented.

## 2020-10-13 NOTE — Op Note (Signed)
[facility] Patient Name: Tracy Castillo Procedure Date: 10/13/2020 MRN: [id number] Attending MD: [name] , MD Date of Birth: 1924-12-20 CSN: [id number] Age: 87 Admit Type: Inpatient Procedure:                Upper GI endoscopy Indications:              Melena, Cirrhosis rule out esophageal varices Providers:                [name], MD, Clyde Lundborg, RN,                            Fransico Setters Mbumina, Technician Referring MD:             hospital team Medicines:                Propofol per Anesthesia, Monitored Anesthesia Care Complications:            No immediate complications. Estimated Blood Loss:     Estimated blood loss: none. Procedure:                Pre-Anesthesia Assessment:                           - Prior to the procedure, a History and Physical                            was performed, and patient medications and                            allergies were reviewed. The patient's tolerance of                            previous anesthesia was also reviewed. The risks                            and benefits of the procedure and the sedation                            options and risks were discussed with the patient.                            All questions were answered, and informed consent                            was obtained. Prior Anticoagulants: The patient has                            taken no previous anticoagulant or antiplatelet                            agents. ASA Grade Assessment: III - A patient with                            severe systemic disease. After reviewing the risks  and benefits, the patient was deemed in                            satisfactory condition to undergo the procedure.                           After obtaining informed consent, the endoscope was                            passed under direct vision. Throughout the                            procedure, the patient's blood  pressure, pulse, and                            oxygen saturations were monitored continuously. The                            GIF-H190 ([id number] Olympus gastroscope was                            introduced through the mouth, and advanced to the                            second part of duodenum. The upper GI endoscopy was                            accomplished without difficulty. The patient                            tolerated the procedure well. Scope In: Scope Out: Findings:      The examined esophagus was normal.      The Z-line was regular and was found 40 cm from the incisors.      Segmental mild inflammation characterized by congestion (edema) and       erythema was found in the gastric body and in the gastric antrum.      Localized mild mucosal changes characterized by red spot vs small AVM       were found in the gastric body.      A medium-sized hiatal hernia was present.      The examined duodenum was normal. Impression:               - Normal esophagus.                           - Z-line regular, 40 cm from the incisors.                           - Acute gastritis.                           - Red spot vs small AVM mucosa in the gastric body.                           - Medium-sized hiatal hernia.                           -  Normal examined duodenum.                           - No specimens collected. Moderate Sedation:      Not Applicable - Patient had care per Anesthesia. Recommendation:           - Advance diet as tolerated and clear liquid diet.                           - Observe patient's clinical course.                           - Continue present medications. Procedure Code(s):        --- Professional ---                           [phone], Esophagogastroduodenoscopy, flexible,                            transoral; diagnostic, including collection of                            specimen(s) by brushing or washing, when performed                            (separate  procedure) Diagnosis Code(s):        --- Professional ---                           K92.1, Melena (includes Hematochezia)                           K74.60, Unspecified cirrhosis of liver                           K29.00, Acute gastritis without bleeding                           K44.9, Diaphragmatic hernia without obstruction or                            gangrene CPT copyright 2019 American Medical Association. All rights reserved. The codes documented in this report are preliminary and upon coder review may  be revised to meet current compliance requirements. [name], MD 10/13/2020 10:24:25 AM This report has been signed electronically. Number of Addenda: 0

## 2020-10-13 NOTE — Plan of Care (Signed)
  Problem: Clinical Measurements: Goal: Respiratory complications will improve Outcome: Progressing   Problem: Education: Goal: Knowledge of General Education information will improve Description: Including pain rating scale, medication(s)/side effects and non-pharmacologic comfort measures Outcome: Progressing   Problem: Clinical Measurements: Goal: Respiratory complications will improve Outcome: Progressing   Problem: Coping: Goal: Level of anxiety will decrease Outcome: Progressing   Problem: Elimination: Goal: Will not experience complications related to bowel motility Outcome: Progressing Goal: Will not experience complications related to urinary retention Outcome: Progressing   Problem: Pain Managment: Goal: General experience of comfort will improve Outcome: Progressing   Problem: Safety: Goal: Ability to remain free from injury will improve Outcome: Progressing   Problem: Skin Integrity: Goal: Risk for impaired skin integrity will decrease Outcome: Progressing

## 2020-10-13 NOTE — Anesthesia Preprocedure Evaluation (Addendum)
Anesthesia Evaluation  Patient identified by MRN, date of birth, ID band Patient awake  General Assessment Comment:Discussed DNR with POA Corene Cornea who agreed to suspend DNR, can intubate  but no CPR. Dr.Hurbert Duran   Reviewed: Allergy & Precautions, Patient's Chart, lab work & pertinent test results  Airway Mallampati: II  TM Distance: >3 FB     Dental   Pulmonary former smoker,    breath sounds clear to auscultation       Cardiovascular + dysrhythmias + pacemaker + Cardiac Defibrillator  Rhythm:Regular Rate:Normal     Neuro/Psych PSYCHIATRIC DISORDERS Dementia    GI/Hepatic hiatal hernia,   Endo/Other  Hypothyroidism   Renal/GU Renal disease     Musculoskeletal   Abdominal   Peds  Hematology  (+) anemia ,   Anesthesia Other Findings   Reproductive/Obstetrics                            Anesthesia Physical Anesthesia Plan  ASA: III  Anesthesia Plan: MAC   Post-op Pain Management:    Induction: Intravenous  PONV Risk Score and Plan: Propofol infusion  Airway Management Planned: Nasal Cannula and Simple Face Mask  Additional Equipment:   Intra-op Plan:   Post-operative Plan:   Informed Consent: I have reviewed the patients History and Physical, chart, labs and discussed the procedure including the risks, benefits and alternatives for the proposed anesthesia with the patient or authorized representative who has indicated his/her understanding and acceptance.    Discussed DNR with power of attorney and Suspend DNR.     Plan Discussed with: CRNA and Anesthesiologist  Anesthesia Plan Comments:        Anesthesia Quick Evaluation

## 2020-10-13 NOTE — Interval H&P Note (Signed)
History and Physical Interval Note:  10/13/2020 9:53 AM  Tracy Castillo  has presented today for surgery, with the diagnosis of anemia, rectal bleeding, cirrhosis on CT imaging.  The various methods of treatment have been discussed with the patient and family. After consideration of risks, benefits and other options for treatment, the patient has consented to  Procedure(s): ESOPHAGOGASTRODUODENOSCOPY (EGD) (N/A) as a surgical intervention.  The patient's history has been reviewed, patient examined, no change in status, stable for surgery.  I have reviewed the patient's chart and labs.  Questions were answered to the patient's satisfaction.     Tracy Castillo

## 2020-10-13 NOTE — Plan of Care (Signed)
  Problem: Clinical Measurements: Goal: Ability to maintain clinical measurements within normal limits will improve Outcome: Progressing Goal: Will remain free from infection Outcome: Progressing Goal: Diagnostic test results will improve Outcome: Progressing Goal: Respiratory complications will improve Outcome: Progressing Goal: Cardiovascular complication will be avoided Outcome: Progressing   Problem: Education: Goal: Ability to identify signs and symptoms of gastrointestinal bleeding will improve Outcome: Progressing no evidence of learning d/t dementia   Problem: Bowel/Gastric: Goal: Will show no signs and symptoms of gastrointestinal bleeding Outcome: Progressing No signs of bleeding with last BM   Problem: Fluid Volume: Goal: Will show no signs and symptoms of excessive bleeding Outcome: Progressing   Problem: Clinical Measurements: Goal: Complications related to the disease process, condition or treatment will be avoided or minimized Outcome: Progressing   Problem: Education: Goal: Knowledge of General Education information will improve Description: Including pain rating scale, medication(s)/side effects and non-pharmacologic comfort measures Outcome: Progressing no evidence of learning d/t dementia   Problem: Health Behavior/Discharge Planning: Goal: Ability to manage health-related needs will improve Outcome: Progressing   Problem: Clinical Measurements: Goal: Ability to maintain clinical measurements within normal limits will improve Outcome: Progressing Goal: Will remain free from infection Outcome: Progressing Goal: Diagnostic test results will improve Outcome: Progressing HGB remains stable Goal: Respiratory complications will improve Outcome: Progressing Goal: Cardiovascular complication will be avoided Outcome: Progressing   Problem: Activity: Goal: Risk for activity intolerance will decrease Outcome: Progressing   Problem: Nutrition: Goal:  Adequate nutrition will be maintained Outcome: Progressing   Problem: Coping: Goal: Level of anxiety will decrease Outcome: Progressing   Problem: Elimination: Goal: Will not experience complications related to bowel motility Outcome: Progressing Goal: Will not experience complications related to urinary retention Outcome: Progressing   Problem: Pain Managment: Goal: General experience of comfort will improve Outcome: Progressing   Problem: Safety: Goal: Ability to remain free from injury will improve Outcome: Progressing   Problem: Skin Integrity: Goal: Risk for impaired skin integrity will decrease Outcome: Progressing

## 2020-10-13 NOTE — Progress Notes (Addendum)
Pt with increasing agitation, will not leave telemetry on had CCMD put him on standby approximately 2230, after RN and NA attempted to put back on several times, this only increased his agitation Pt will also not leave sling on right arm.  Pt repeatedly taking condom cath and gown off  Pt sling soiled with urine this morning, needs a new one

## 2020-10-13 NOTE — Anesthesia Procedure Notes (Signed)
Procedure Name: MAC Date/Time: 10/13/2020 9:54 AM Performed by: Niel Hummer, CRNA Pre-anesthesia Checklist: Patient identified, Emergency Drugs available, Suction available and Patient being monitored Oxygen Delivery Method: Simple face mask

## 2020-10-13 NOTE — Progress Notes (Signed)
Patient ID: Tracy Castillo, male   DOB: [birth year], 87 y.o.   MRN: [id number]  EGD with mild gastritis otherwise unrevealing. Nectar thickened liquids. Question small bowel AVMs. No further GI w/u planned. Will sign off. Call if questions. Dr. Watt Climes on call this weekend if needed.

## 2020-10-13 NOTE — Progress Notes (Signed)
Initial Nutrition Assessment  INTERVENTION:   -Boost Breeze po TID, each supplement provides 250 kcal and 9 grams of protein -thickened  Once diet advanced -Vital Cuisine supplements BID, each provides 520 kcals and 22g protein -Multivitamin with minerals daily  NUTRITION DIAGNOSIS:   Inadequate oral intake related to dysphagia as evidenced by per patient/family report.  GOAL:   Patient will meet greater than or equal to 90% of their needs  MONITOR:   PO intake,Supplement acceptance,Labs,Weight trends,I & O's  REASON FOR ASSESSMENT:   Malnutrition Screening Tool    ASSESSMENT:   87 y.o. male with medical history significant for complete heart block with pacemaker, dementia, hypothyroidism, glaucoma who is a transfer from [facility] to [place] for GI consultation. Admitted for GI bleed.  5/13: s/p EGD  Patient in room with no family at bedside. Pt difficult to understand and per chart review has been speaking [place]. Pt very awake and attempting to communicate.  Currently on clear liquid-nectar thick. Had liquids sitting at bedside untouched, was unable to tell me if he had consumed any since his procedure this morning.  SLP to evaluate.   Admission weight: 131 lbs. Current weight: 136 lbs.  Medications: Ferrous sulfate, Miralax, Senokot  Labs reviewed.  NUTRITION - FOCUSED PHYSICAL EXAM:  Unable to complete, pt very confused.  Diet Order:   Diet Order            Diet clear liquid Room service appropriate? Yes; Fluid consistency: Nectar Thick  Diet effective now                 EDUCATION NEEDS:   No education needs have been identified at this time  Skin:  Skin Assessment: Reviewed RN Assessment  Last BM:  5/13 -type 6  Height:   Ht Readings from Last 1 Encounters:  10/13/20 5\' 6"  (1.676 m)    Weight:   Wt Readings from Last 1 Encounters:  10/13/20 61.7 kg    BMI:  Body mass index is 21.95 kg/m.  Estimated Nutritional Needs:    Kcal:  1550-1750  Protein:  70-85g  Fluid:  1.7L/day  Clayton Bibles, MS, RD, LDN Inpatient Clinical Dietitian Contact information available via Amion

## 2020-10-13 NOTE — Progress Notes (Signed)
Progress Note    Tracy Castillo   UTM:[id number]  DOB: 01-15-1925  DOA: 10/10/2020     3  PCP: Waldon Reining, NP  CC: rectal bleeding  Hospital Course: Tracy Castillo is a 87 y.o. male with medical history significant for complete heart block with pacemaker, dementia, hypothyroidism, glaucoma who is a transfer from [facility] to [place] for GI consultation.  Patient was admitted to [facility] on 10/09/2020 after he was noted at [facility] rehab with blood in bowel movement.  Initially thought to be diverticular bleed although no CT abdomen imaging was done.  Plan was conservative medical management with IV fluids monitoring of his H&H and transfusion if needed.  However later in the admission patient had several large body bowel movement and son requested to have patient transferred for GI consultation and possible intervention since that is not available at [facility].  Patient only on aspirin which has been discontinued since admission.  Interval History:  Underwent EGD today.  Mild gastritis found, no obvious bleeding.  Possibly some AVMs.  No further work-up per GI.  Hemoglobin has remained stable since admission. He was seen in his room after procedure; still speaking [place] but answered easier yes/no questions.  ROS: Review of systems not obtained due to patient factors. Some cognitive impairment   Assessment & Plan:  GI bleed - reported to have red bloody BMs with clots at [facility] - history of known diverticulosis - continue PPI -Hemoglobin mostly stable hovering around mid 7s - trend H/H - appreciate GI assistance - transfuse for Hgb < 7 g/dL -EGD done on 10/13/2020 showing mild gastritis, questionable AVMs.  No overt bleeding.  No further work-up per GI. - Monitor hemoglobin overnight and if stable in a.m., will try to discharge if SNF able to accept back  Acute on CKDIIIb - patient has history of CKD3b. Baseline creat ~ 1.3-1.4, eGFR  40-44 - patient presents with increase in creat >0.3 mg/dL above baseline, creat increase >1.5x baseline presumed to have occurred within past 7 days PTA - back to baseline  Dementia - oriented to name - today speaking in [place] mostly  [place] discussion - talked with granddaughter bedside on 5/12; informed her of patient's global decline from what it sounds like compared to prior level of functioning months ago - his prognosis is guarded and he is at risk for further falls and fractures (recently has sustained right shoulder dislocation and bilateral rib fractures) - Oral intake also has been poor compared to what he used to eat - Would benefit from further [place] discussions and involvement of palliative care at rehab or upon returning home if he makes it to that point  Dysphagia - s/p SLP eval  - puree with nectar thick liq at home per family as well from last evaluation  Hypothyroidism - Continue levothyroxine  Hx of complete heart block -has pacemaker  Recent right shoulder dislocation - Reduced with orthopedic surgery on 09/26/2020 - Continue sling to right upper extremity - PT ordered  Recent falls Rib fractures - Chest imaging also shows rib fractures to the right 12th and left 7th ribs - Continue tramadol and supportive care  Old records reviewed in assessment of this patient   DVT prophylaxis: SCDs Start: 10/10/20 2341   Code Status:   Code Status: DNR Family Communication: Granddaughter  Disposition Plan: Status is: Inpatient  Remains inpatient appropriate because:Ongoing diagnostic testing needed not appropriate for outpatient work up, IV treatments appropriate due to intensity  of illness or inability to take PO and Inpatient level of care appropriate due to severity of illness   Dispo: The patient is from: SNF              Anticipated d/c is to: SNF              Patient currently is not medically stable to d/c.   Difficult to place patient No  Risk  of unplanned readmission score: Unplanned Admission- Pilot do not use: 19.75   Objective: Blood pressure (!) 119/45, pulse 65, temperature 97.9 F (36.6 C), temperature source Oral, resp. rate 10, height $RemoveBe'5\' 6"'SwaIHxLXE$  (1.676 m), weight 61.7 kg, SpO2 100 %.  Examination: General appearance: Chronically ill-appearing elderly gentleman resting in bed in no distress with difficult understand speech; still speaking [place] today Head: Normocephalic, without obvious abnormality, atraumatic Eyes: EOMI Lungs: clear to auscultation bilaterally Heart: regular rate and rhythm and S1, S2 normal Abdomen: no TTP, no R/G; BS present Extremities: no edema Skin: mobility and turgor normal Neurologic: follows commands, moves all 4 extremities   Consultants:   GI  Procedures:   EGD 10/13/20  Data Reviewed: I have personally reviewed following labs and imaging studies Results for orders placed or performed during the hospital encounter of 10/10/20 (from the past 24 hour(s))  Hemoglobin and hematocrit, blood     Status: Abnormal   Collection Time: 10/12/20  5:04 PM  Result Value Ref Range   Hemoglobin 7.6 (L) 13.0 - 17.0 g/dL   HCT 24.2 (L) 39.0 - 66.4 %  Basic metabolic panel     Status: Abnormal   Collection Time: 10/13/20  3:54 AM  Result Value Ref Range   Sodium 144 135 - 145 mmol/L   Potassium 4.0 3.5 - 5.1 mmol/L   Chloride 115 (H) 98 - 111 mmol/L   CO2 22 22 - 32 mmol/L   Glucose, Bld 80 70 - 99 mg/dL   BUN 29 (H) 8 - 23 mg/dL   Creatinine, Ser 1.45 (H) 0.61 - 1.24 mg/dL   Calcium 8.2 (L) 8.9 - 10.3 mg/dL   GFR, Estimated 45 (L) >60 mL/min   Anion gap 7 5 - 15  Magnesium     Status: None   Collection Time: 10/13/20  3:54 AM  Result Value Ref Range   Magnesium 2.1 1.7 - 2.4 mg/dL  Hemoglobin and hematocrit, blood     Status: Abnormal   Collection Time: 10/13/20  3:54 AM  Result Value Ref Range   Hemoglobin 7.4 (L) 13.0 - 17.0 g/dL   HCT 23.9 (L) 39.0 - 52.0 %    Recent Results (from  the past 240 hour(s))  MRSA PCR Screening     Status: None   Collection Time: 10/10/20 11:39 PM   Specimen: Nasopharyngeal  Result Value Ref Range Status   MRSA by PCR NEGATIVE NEGATIVE Final    Comment:        The GeneXpert MRSA Assay (FDA approved for NASAL specimens only), is one component of a comprehensive MRSA colonization surveillance program. It is not intended to diagnose MRSA infection nor to guide or monitor treatment for MRSA infections. Performed at [facility], [place] [address], [place], [place] [address]      Radiology Studies: CT ABDOMEN PELVIS W CONTRAST  Result Date: 10/11/2020 CLINICAL DATA:  87 year old male with abdominal pain. Concern for GI bleed. EXAM: CT ABDOMEN AND PELVIS WITH CONTRAST TECHNIQUE: Multidetector CT imaging of the abdomen and pelvis was performed using  the standard protocol following bolus administration of intravenous contrast. CONTRAST:  40mL OMNIPAQUE IOHEXOL 300 MG/ML  SOLN COMPARISON:  CT abdomen pelvis dated 09/25/2020. FINDINGS: Evaluation is limited due to streak artifact caused by patient's arms. Lower chest: The visualized lung bases are clear. Advanced 3 vessel coronary vascular calcification and calcification of the mitral annulus. Partially visualized pacemaker wires. No intra-abdominal free air or free fluid. Hepatobiliary: Irregular liver contour in keeping with cirrhosis. No intrahepatic biliary dilatation. Multiple stones in the gallbladder. No pericholecystic fluid or evidence of acute cholecystitis by CT. Pancreas: Unremarkable. No pancreatic ductal dilatation or surrounding inflammatory changes. Spleen: Normal in size without focal abnormality. Adrenals/Urinary Tract: The adrenal glands unremarkable. There is no hydronephrosis on either side. There is symmetric enhancement and excretion of contrast by both kidneys. There is a 3 cm left renal interpolar exophytic cyst. The visualized ureters are unremarkable. There is  mild trabeculated appearance of the bladder wall, likely related to chronic bladder outlet obstruction. Stomach/Bowel: There is a small hiatal hernia. There is moderate stool throughout the colon. Dense oral contrast with associated streak artifact noted in the rectal vault. Evaluation for GI bleed is very limited, nondiagnostic, on the CT due to presence of oral contrast. There is sigmoid diverticulosis without active inflammatory changes. There is no bowel obstruction. The appendix is not visualized with certainty. No inflammatory changes identified in the right lower quadrant. Vascular/Lymphatic: Advanced aortoiliac atherosclerotic disease. There is a 2.3 cm infrarenal aortic ectasia. The IVC is unremarkable. No portal venous gas. There is no adenopathy. Reproductive: The prostate gland is grossly unremarkable. Other: None Musculoskeletal: Osteopenia with degenerative changes of the spine. No acute osseous pathology. IMPRESSION: 1. Sigmoid diverticulosis. No bowel obstruction or active inflammation. 2. Cirrhosis. 3. Cholelithiasis. 4. Aortic Atherosclerosis (ICD10-I70.0). Electronically Signed   By: Anner Crete M.D.   On: 10/11/2020 20:35   CT ABDOMEN PELVIS W CONTRAST  Final Result      Scheduled Meds: . Chlorhexidine Gluconate Cloth  6 each Topical Daily  . donepezil  5 mg Oral QHS  . feeding supplement  1 Container Oral TID BM  . ferrous sulfate  325 mg Oral Daily  . levothyroxine  100 mcg Oral QAC breakfast  . pantoprazole (PROTONIX) IV  40 mg Intravenous BID  . [START ON 10/14/2020] pneumococcal 23 valent vaccine  0.5 mL Intramuscular Tomorrow-1000  . polyethylene glycol  17 g Oral Daily  . QUEtiapine  25 mg Oral QHS  . senna-docusate  1 tablet Oral BID   PRN Meds: food thickener, traMADol Continuous Infusions: . cefTRIAXone (ROCEPHIN)  IV 2 g (10/13/20 1328)     LOS: 3 days  Time spent: Greater than 50% of the 35 minute visit was spent in counseling/coordination of care for the  patient as laid out in the A&P.   Dwyane Dee, MD [facility] 10/13/2020, 2:12 PM

## 2020-10-13 NOTE — Progress Notes (Signed)
Speech Language Pathology Treatment: Dysphagia  Patient Details Name: Tracy Castillo MRN: [id number] DOB: 02-Nov-1924 Today's Date: 10/13/2020 Time: 0865-7846 SLP Time Calculation (min) (ACUTE ONLY): 45 min  Assessment / Plan / Recommendation Clinical Impression  SLP visit with pt regarding pt's care plan, dysphagia - pt found awake with son in his room.  See HPI for information re: premorbid dysphagia, etc.  Pt xerostomic with minimal dried blood tinged secretions on hard palate- which partially cleared with oral care provided by SLP using oral suction, having pt brush teeth, etc.  Pt then observed with consumption of nectar thick liquids (tea and cranverry juice) via tsp, cup, straw- single ice chips, graham cracker, and bite of [place] candy bar.   Pt with marked delayed swallow - with throat clearing and occasional cough after swallow.   Pt admits it is "hard" for him to swallow at times - suspect cognitive component to his dysphagia as well as neuromuscular.  Small single boluses of nectar tolerated better with occasional throat clearing (after approx 20% of boluses). Pt benefited from use of tsp nectar liquid to help orally transit masticated solids that were pooled in his mouth (pooling due to his lingual weakness).  Demonstrated to pt's son observation of swallow trigger, use of liquids via tsp for clearance and importance of precautions. Encouraged self feeding with full supervision for bolus control.    Pt's son, Corene Cornea, reports pt would clear his throat and cough sometimes with intake even prior to shoulder injury - and SLP advised this is likely now exacerbated.  This pt has a component of chronic dysphagia based on interview with son.    Son also reports pt's intake has declined since admit to SNF - when inquired re: decr po due to displeasure with food or decr appetite - or both - pt stated both.    SLP informed pt and his son that SLP role is to mitigate aspiration and help maximize pt's  intake- not fully prevent aspiration- to which son reported understanding.    Using teach back, pt and his son educted to precautions, dysphagia mitigation strategies.  Recommend consider advancing diet to dys2/nectar - and consider palliative consult to establish GOC for this pt with recent decline.  Pt's cough is weak and his ongoing malnutrition and aspiration risk reman present.  Progress being demonstrated in areas of pt following directions for swallow precautions, accepting intake.  Understanding of dysphagia and precautions reported to be understood by pt's son.     HPI HPI: Tracy Castillo is a 87 y.o. male admitted from [facility] with concerns for GI bleed.  Pt with PMH + for significant for complete heart block with pacemaker, dementia, hypothyroidism, glaucoma, right shoulder dislocation after fall at the end of April.   Swallow eval ordered as RN states pt's daughter advised that pt is to be on thickened liquids.  Pt imaging study showed moderate esophageal hiatal hernia, small bilaeral pleural effusion with bilateral ATX.  GI wrote for pt to start clear liquid diet. Cervical spine image showed osteophytosis most significant in the lower cervical  spine at C5-C6 and C6-C7.  NO prior swallow evaluations completed at [facility].  IN discussion with granddaughter, pt underwent swallow evaluation during prior hospitalization. This SLP could not locate evaluating SLP documentation however it appears he had a clinical swallow evaluation and not instrumental evaluation.  Pt left the hospital on a puree/nectar thick liquid diet.  Pt's son reports his dad had some dysphagia prior to admission - and  would cough when eating at home even before falls, SNF visit.  Corene Cornea stated his dad did undergo and MBS at [facility] - and this testing resulted in recommendations for puree/nectar diet.      SLP Plan  Continue with current plan of care       Recommendations  Diet recommendations: Dysphagia 2 (fine  chop);Nectar-thick liquid (ice chips) Liquids provided via: Teaspoon Supervision: Full supervision/cueing for compensatory strategies Compensations: Slow rate;Small sips/bites;Other (Comment) Postural Changes and/or Swallow Maneuvers: Seated upright 90 degrees;Upright 30-60 min after meal                Oral Care Recommendations: Oral care BID SLP Visit Diagnosis: Dysphagia, unspecified (R13.10) Plan: Continue with current plan of care       [place], Dewight Catino Ann 10/13/2020, 5:21 PM  Kathleen Lime, MS [facility] SLP Acute Rehab Services Office [phone] Pager [phone]

## 2020-10-13 NOTE — Anesthesia Postprocedure Evaluation (Signed)
Anesthesia Post Note  Patient: Tracy Castillo  Procedure(s) Performed: ESOPHAGOGASTRODUODENOSCOPY (EGD) (N/A )     Patient location during evaluation: Endoscopy Anesthesia Type: MAC Pain management: pain level controlled Vital Signs Assessment: post-procedure vital signs reviewed and stable Cardiovascular status: stable Postop Assessment: no apparent nausea or vomiting Anesthetic complications: no   No complications documented.  Last Vitals:  Vitals:   10/13/20 0856 10/13/20 1015  BP: (!) 151/42 (!) 117/52  Pulse: 78 77  Resp: 15 16  Temp: 36.7 C 36.6 C  SpO2: 96% 100%    Last Pain:  Vitals:   10/13/20 1015  TempSrc: Oral  PainSc: 0-No pain                 Tahesha Skeet

## 2020-10-13 NOTE — TOC Progression Note (Signed)
Transition of Care [facility]) - Progression Note    Patient Details  Name: Tracy Castillo MRN: [id number] Date of Birth: Aug 23, 1924  Transition of Care [facility]) CM/SW Contact  Purcell Mouton, RN Phone Number: 10/13/2020, 2:55 PM  Clinical Narrative:    Pt may return to [facility] on 5/14 Saturday.  SNF asked that Discharge Summary be fax by 9:30 AM. Please call [phone] for RN on [facility] for Fax number/Patricia Admission Coordinator.     Expected Discharge Plan: [place] Barriers to Discharge: Continued Medical Work up  Expected Discharge Plan and Services Expected Discharge Plan: [place]   Discharge Planning Services: CM Consult Post Acute Care Choice: [place] Living arrangements for the past 2 months: [place]                                       Social Determinants of Health (SDOH) Interventions    Readmission Risk Interventions Readmission Risk Prevention Plan 09/27/2020  Transportation Screening Complete  Home Care Screening Complete  Medication Review (RN CM) Complete  Some recent data might be hidden

## 2020-10-14 DIAGNOSIS — S43014A Anterior dislocation of right humerus, initial encounter: Secondary | ICD-10-CM

## 2020-10-14 LAB — MAGNESIUM: Magnesium: 2.1 mg/dL (ref 1.7–2.4)

## 2020-10-14 LAB — BASIC METABOLIC PANEL
Anion gap: 7 (ref 5–15)
BUN: 27 mg/dL — ABNORMAL HIGH (ref 8–23)
CO2: 25 mmol/L (ref 22–32)
Calcium: 8.5 mg/dL — ABNORMAL LOW (ref 8.9–10.3)
Chloride: 112 mmol/L — ABNORMAL HIGH (ref 98–111)
Creatinine, Ser: 1.62 mg/dL — ABNORMAL HIGH (ref 0.61–1.24)
GFR, Estimated: 39 mL/min — ABNORMAL LOW (ref >60)
Glucose, Bld: 79 mg/dL (ref 70–99)
Potassium: 4 mmol/L (ref 3.5–5.1)
Sodium: 144 mmol/L (ref 135–145)

## 2020-10-14 LAB — HEMOGLOBIN AND HEMATOCRIT, BLOOD
HCT: 24.5 % — ABNORMAL LOW (ref 39.0–52.0)
Hemoglobin: 7.6 g/dL — ABNORMAL LOW (ref 13.0–17.0)

## 2020-10-14 MED ORDER — PANTOPRAZOLE SODIUM 40 MG PO TBEC: 40.0000 mg | DELAYED_RELEASE_TABLET | Freq: Every day | ORAL | 1 refills | Status: AC

## 2020-10-14 NOTE — Discharge Summary (Signed)
Physician Discharge Summary   Tracy Castillo VQQ:[id number] DOB: 1924-09-22 DOA: 10/10/2020  PCP: Waldon Reining, NP  Admit date: 10/10/2020 Discharge date:  10/14/2020  Admitted From: [facility] Disposition:  same Discharging physician: Dwyane Dee, MD  Recommendations for Outpatient Follow-up:  1. Follow up with ortho surgery; patient missed appointment 2. Needs palliative care involvement; patient is poor to improve in general 3. Continue wound care to skin tears  4. Continue aspiration precautions (SLP note copied below)   Patient discharged to rehab in Discharge Condition: stable Risk of unplanned readmission score: Unplanned Admission- Pilot do not use: 19.98  CODE STATUS: DNR Diet recommendation:  Diet Orders (From admission, onward)    Start     Ordered   10/14/20 0828  DIET DYS 2 Room service appropriate? Yes; Fluid consistency: Nectar Thick  Diet effective now       Question Answer Comment  Room service appropriate? Yes   Fluid consistency: Nectar Thick      10/14/20 0827          Hospital Course:  Tracy Castillo a 87 y.o.malewith medical history significant forcomplete heart block with pacemaker, dementia, hypothyroidism, glaucomawho [facility].  Patient was admitted to [facility] on 10/09/2020 after he was noted to have blood in bowel movements. Initially thought to be diverticular bleed although no CT abdomen imaging was done. Plan was conservative medical management with IV fluids monitoring of his H&H and transfusion if needed. However later in the admission patient had several large body bowel movement and son requested to have patient transferred for GI consultation and possible intervention. Patient only on aspirin which was held until workup completed.   GI bleed Mild gastritis - reported to have red bloody BMs with clots on admission - history of known  diverticulosis -Hemoglobin has remained the past several trends (7.6g/dL on discharge) - s/p EGD done on 10/13/2020 showing mild gastritis, questionable AVMs.  No overt bleeding.  No further work-up per GI. - continued on protonix at discharge; asa resumed - with any further decline or rebleeding, would strongly recommend to involve palliative care in decision making as patient continues to slowly decline in his physical status  Acute on CKDIIIb - patient has history of CKD3b. Baseline creat ~ 1.3-1.4, eGFR 40-44 - patient presents with increase in creat >0.3 mg/dL above baseline, creat increase >1.5x baseline presumed to have occurred within past 7 days PTA - back to baseline  Dementia - oriented to name - speaking in [place] mostly - follows commands well; followed my commands easily on 5/14 at time of d/c  GOC discussion - talked with granddaughter bedside on 5/12; informed her of patient's global decline from what it sounds like compared to prior level of functioning months ago - his prognosis is guarded and he is at risk for further falls and fractures (recently has sustained right shoulder dislocation and bilateral rib fractures) - Oral intake also has been poor compared to what he used to eat - Would benefit from further [place] discussions and involvement of palliative care at rehab or upon returning home if he makes it to that point  Dysphagia -s/p SLP eval  - advanced to Dys Level 2 with nectar thick liquids prior to discharge - see SLP note copied below under dysphagia again  Hypothyroidism -Continue levothyroxine  Hx of complete heart block -has pacemaker  Recent right shoulder dislocation - Reduced with orthopedic surgery on 09/26/2020 - Continue sling  to right upper extremity - needs follow up with ortho still  Recent falls Rib fractures - Chest imaging also shows rib fractures to the right 12th and left 7th ribs - Continue tramadol and supportive  care  Dysphagia Per SLP:  Clinical Impression  SLP visit with pt regarding pt's care plan, dysphagia - pt found awake with son in his room.  See HPI for information re: premorbid dysphagia, etc.  Pt xerostomic with minimal dried blood tinged secretions on hard palate- which partially cleared with oral care provided by SLP using oral suction, having pt brush teeth, etc.  Pt then observed with consumption of nectar thick liquids (tea and cranverry juice) via tsp, cup, straw- single ice chips, graham cracker, and bite of [place] candy bar.   Pt with marked delayed swallow - with throat clearing and occasional cough after swallow.   Pt admits it is "hard" for him to swallow at times - suspect cognitive component to his dysphagia as well as neuromuscular.  Small single boluses of nectar tolerated better with occasional throat clearing (after approx 20% of boluses). Pt benefited from use of tsp nectar liquid to help orally transit masticated solids that were pooled in his mouth (pooling due to his lingual weakness).  Demonstrated to pt's son observation of swallow trigger, use of liquids via tsp for clearance and importance of precautions. Encouraged self feeding with full supervision for bolus control.    Pt's son, Corene Cornea, reports pt would clear his throat and cough sometimes with intake even prior to shoulder injury - and SLP advised this is likely now exacerbated.  This pt has a component of chronic dysphagia based on interview with son.    Son also reports pt's intake has declined since admit to SNF - when inquired re: decr po due to displeasure with food or decr appetite - or both - pt stated both.    SLP informed pt and his son that SLP role is to mitigate aspiration and help maximize pt's intake- not fully prevent aspiration- to which son reported understanding.    Using teach back, pt and his son educted to precautions, dysphagia mitigation strategies.  Recommend consider advancing diet to  dys2/nectar - and consider palliative consult to establish GOC for this pt with recent decline.  Pt's cough is weak and his ongoing malnutrition and aspiration risk reman present.  Progress being demonstrated in areas of pt following directions for swallow precautions, accepting intake.  Understanding of dysphagia and precautions reported to be understood by pt's son.      Principal Diagnosis: GI bleed  Discharge Diagnoses: Active Hospital Problems   Diagnosis Date Noted  . GI bleed 10/11/2020    Priority: High  . Dysphagia 10/11/2020    Priority: Medium  . Dementia ([place]) 10/11/2020  . Rib fractures 10/11/2020  . Hypothyroidism 09/26/2020  . Anterior dislocation of right shoulder 09/25/2020  . Complete heart block ([place]) 09/04/2016    Resolved Hospital Problems  No resolved problems to display.    Discharge Instructions    Discharge wound care:   Complete by: As directed    Continue wound care to skin tears   Increase activity slowly   Complete by: As directed      Allergies as of 10/14/2020   No Known Allergies     Medication List    TAKE these medications   aspirin EC 81 MG tablet Take 81 mg by mouth daily. Swallow whole.   donepezil 5 MG tablet Commonly known as:  ARICEPT Take 5 mg by mouth at bedtime.   dorzolamide 2 % ophthalmic solution Commonly known as: TRUSOPT Place 1 drop into the left eye 3 (three) times daily.   feeding supplement Liqd Take 237 mLs by mouth 2 (two) times daily between meals.   ferrous sulfate 325 (65 FE) MG tablet Take 325 mg by mouth daily.   levothyroxine 100 MCG tablet Commonly known as: SYNTHROID Take 100 mcg by mouth daily.   pantoprazole 40 MG tablet Commonly known as: Protonix Take 1 tablet (40 mg total) by mouth daily.   polyethylene glycol 17 g packet Commonly known as: MIRALAX / GLYCOLAX Take 17 g by mouth daily.   QUEtiapine 25 MG tablet Commonly known as: SEROQUEL Take 1 tablet (25 mg total) by mouth at  bedtime.   senna 8.6 MG Tabs tablet Commonly known as: SENOKOT Take 1 tablet (8.6 mg total) by mouth at bedtime.   traMADol 50 MG tablet Commonly known as: ULTRAM Take 1 tablet (50 mg total) by mouth every 6 (six) hours as needed for moderate pain.            Discharge Care Instructions  (From admission, onward)         Start     Ordered   10/14/20 0000  Discharge wound care:       Comments: Continue wound care to skin tears   10/14/20 0850          No Known Allergies  Consultations: GI  Discharge Exam: BP 140/65 (BP Location: Left Arm)   Pulse 83   Temp 98.1 F (36.7 C) (Oral)   Resp 19   Ht $R'5\' 6"'DS$  (1.676 m)   Wt 61.7 kg   SpO2 100%   BMI 21.95 kg/m  General appearance: Chronically ill-appearing elderly gentleman resting in bed in no distress with difficult understand speech; still speaking [place] at times Head: Normocephalic, without obvious abnormality, atraumatic Eyes: EOMI Lungs: clear to auscultation bilaterally Heart: regular rate and rhythm and S1, S2 normal Abdomen: no TTP, no R/G; BS present Extremities: no edema Skin: frail skin; scattered skin tears Neurologic: follows commands, moves all 4 extremities   The results of significant diagnostics from this hospitalization (including imaging, microbiology, ancillary and laboratory) are listed below for reference.   Microbiology: Recent Results (from the past 240 hour(s))  MRSA PCR Screening     Status: None   Collection Time: 10/10/20 11:39 PM   Specimen: Nasopharyngeal  Result Value Ref Range Status   MRSA by PCR NEGATIVE NEGATIVE Final    Comment:        The GeneXpert MRSA Assay (FDA approved for NASAL specimens only), is one component of a comprehensive MRSA colonization surveillance program. It is not intended to diagnose MRSA infection nor to guide or monitor treatment for MRSA infections. Performed at [facility], [place] [address] [place], [place] [address]    Resp Panel by RT-PCR (Flu A&B, Covid) Nasopharyngeal Swab     Status: None   Collection Time: 10/13/20  2:40 PM   Specimen: Nasopharyngeal Swab; Nasopharyngeal(NP) swabs in vial transport medium  Result Value Ref Range Status   SARS Coronavirus 2 by RT PCR NEGATIVE NEGATIVE Final    Comment: (NOTE) SARS-CoV-2 target nucleic acids are NOT DETECTED.  The SARS-CoV-2 RNA is generally detectable in upper respiratory specimens during the acute phase of infection. The lowest concentration of SARS-CoV-2 viral copies this assay can detect is 138 copies/mL. A negative result does not preclude SARS-Cov-2  infection and should not be used as the sole basis for treatment or other patient management decisions. A negative result may occur with  improper specimen collection/handling, submission of specimen other than nasopharyngeal swab, presence of viral mutation(s) within the areas targeted by this assay, and inadequate number of viral copies(<138 copies/mL). A negative result must be combined with clinical observations, patient history, and epidemiological information. The expected result is Negative.  Fact Sheet for Patients:  EntrepreneurPulse.com.au  Fact Sheet for Healthcare Providers:  IncredibleEmployment.be  This test is no t yet approved or cleared by the [place] FDA and  has been authorized for detection and/or diagnosis of SARS-CoV-2 by FDA under an Emergency Use Authorization (EUA). This EUA will remain  in effect (meaning this test can be used) for the duration of the COVID-19 declaration under Section 564(b)(1) of the Act, 21 U.S.C.section 360bbb-3(b)(1), unless the authorization is terminated  or revoked sooner.       Influenza A by PCR NEGATIVE NEGATIVE Final   Influenza B by PCR NEGATIVE NEGATIVE Final    Comment: (NOTE) The Xpert Xpress SARS-CoV-2/FLU/RSV plus assay is intended as an aid in the diagnosis of influenza from  Nasopharyngeal swab specimens and should not be used as a sole basis for treatment. Nasal washings and aspirates are unacceptable for Xpert Xpress SARS-CoV-2/FLU/RSV testing.  Fact Sheet for Patients: EntrepreneurPulse.com.au  Fact Sheet for Healthcare Providers: IncredibleEmployment.be  This test is not yet approved or cleared by the [place] FDA and has been authorized for detection and/or diagnosis of SARS-CoV-2 by FDA under an Emergency Use Authorization (EUA). This EUA will remain in effect (meaning this test can be used) for the duration of the COVID-19 declaration under Section 564(b)(1) of the Act, 21 U.S.C. section 360bbb-3(b)(1), unless the authorization is terminated or revoked.  Performed at [facility], [place] [address], [place], [place] [address]      Labs: BNP (last 3 results) No results for input(s): BNP in the last 8760 hours. Basic Metabolic Panel: Recent Labs  Lab 10/11/20 0034 10/12/20 0341 10/13/20 0354  NA 139 142 144  K 4.6 3.8 4.0  CL 114* 115* 115*  CO2 19* 21* 22  GLUCOSE 87 71 80  BUN 35* 32* 29*  CREATININE 1.19 1.48* 1.45*  CALCIUM 7.8* 8.0* 8.2*  MG  --  2.1 2.1   Liver Function Tests: No results for input(s): AST, ALT, ALKPHOS, BILITOT, PROT, ALBUMIN in the last 168 hours. No results for input(s): LIPASE, AMYLASE in the last 168 hours. No results for input(s): AMMONIA in the last 168 hours. CBC: Recent Labs  Lab 10/11/20 0034 10/11/20 0316 10/11/20 1345 10/12/20 0341 10/12/20 1704 10/13/20 0354 10/13/20 1656 10/14/20 0832  WBC 5.8 4.9  --   --   --   --   --   --   HGB 8.0* 7.8*   < > 7.5* 7.6* 7.4* 7.6* 7.6*  HCT 25.0* 25.4*   < > 23.2* 24.2* 23.9* 24.3* 24.5*  MCV 103.3* 109.5*  --   --   --   --   --   --   PLT 183 210  --   --   --   --   --   --    < > = values in this interval not displayed.   Cardiac Enzymes: No results for input(s): CKTOTAL, CKMB,  CKMBINDEX, TROPONINI in the last 168 hours. BNP: Invalid input(s): POCBNP CBG: No results for input(s): GLUCAP in the last 168  hours. D-Dimer No results for input(s): DDIMER in the last 72 hours. Hgb A1c No results for input(s): HGBA1C in the last 72 hours. Lipid Profile No results for input(s): CHOL, HDL, LDLCALC, TRIG, CHOLHDL, LDLDIRECT in the last 72 hours. Thyroid function studies No results for input(s): TSH, T4TOTAL, T3FREE, THYROIDAB in the last 72 hours.  Invalid input(s): FREET3 Anemia work up No results for input(s): VITAMINB12, FOLATE, FERRITIN, TIBC, IRON, RETICCTPCT in the last 72 hours. Urinalysis    Component Value Date/Time   COLORURINE YELLOW 09/26/2020 0442   APPEARANCEUR CLEAR 09/26/2020 0442   LABSPEC 1.036 (H) 09/26/2020 0442   PHURINE 6.0 09/26/2020 0442   GLUCOSEU NEGATIVE 09/26/2020 0442   HGBUR SMALL (A) 09/26/2020 0442   BILIRUBINUR NEGATIVE 09/26/2020 0442   KETONESUR NEGATIVE 09/26/2020 0442   PROTEINUR NEGATIVE 09/26/2020 0442   NITRITE NEGATIVE 09/26/2020 0442   LEUKOCYTESUR NEGATIVE 09/26/2020 0442   Sepsis Labs Invalid input(s): PROCALCITONIN,  WBC,  LACTICIDVEN Microbiology Recent Results (from the past 240 hour(s))  MRSA PCR Screening     Status: None   Collection Time: 10/10/20 11:39 PM   Specimen: Nasopharyngeal  Result Value Ref Range Status   MRSA by PCR NEGATIVE NEGATIVE Final    Comment:        The GeneXpert MRSA Assay (FDA approved for NASAL specimens only), is one component of a comprehensive MRSA colonization surveillance program. It is not intended to diagnose MRSA infection nor to guide or monitor treatment for MRSA infections. Performed at [facility] [place] [address] [place], [place] [address]   Resp Panel by RT-PCR (Flu A&B, Covid) Nasopharyngeal Swab     Status: None   Collection Time: 10/13/20  2:40 PM   Specimen: Nasopharyngeal Swab; Nasopharyngeal(NP) swabs in vial transport medium  Result  Value Ref Range Status   SARS Coronavirus 2 by RT PCR NEGATIVE NEGATIVE Final    Comment: (NOTE) SARS-CoV-2 target nucleic acids are NOT DETECTED.  The SARS-CoV-2 RNA is generally detectable in upper respiratory specimens during the acute phase of infection. The lowest concentration of SARS-CoV-2 viral copies this assay can detect is 138 copies/mL. A negative result does not preclude SARS-Cov-2 infection and should not be used as the sole basis for treatment or other patient management decisions. A negative result may occur with  improper specimen collection/handling, submission of specimen other than nasopharyngeal swab, presence of viral mutation(s) within the areas targeted by this assay, and inadequate number of viral copies(<138 copies/mL). A negative result must be combined with clinical observations, patient history, and epidemiological information. The expected result is Negative.  Fact Sheet for Patients:  EntrepreneurPulse.com.au  Fact Sheet for Healthcare Providers:  IncredibleEmployment.be  This test is no t yet approved or cleared by the [place] FDA and  has been authorized for detection and/or diagnosis of SARS-CoV-2 by FDA under an Emergency Use Authorization (EUA). This EUA will remain  in effect (meaning this test can be used) for the duration of the COVID-19 declaration under Section 564(b)(1) of the Act, 21 U.S.C.section 360bbb-3(b)(1), unless the authorization is terminated  or revoked sooner.       Influenza A by PCR NEGATIVE NEGATIVE Final   Influenza B by PCR NEGATIVE NEGATIVE Final    Comment: (NOTE) The Xpert Xpress SARS-CoV-2/FLU/RSV plus assay is intended as an aid in the diagnosis of influenza from Nasopharyngeal swab specimens and should not be used as a sole basis for treatment. Nasal washings and aspirates are unacceptable for Xpert Xpress SARS-CoV-2/FLU/RSV testing.  Fact Sheet for  Patients: EntrepreneurPulse.com.au  Fact Sheet for Healthcare Providers: IncredibleEmployment.be  This test is not yet approved or cleared by the [place] FDA and has been authorized for detection and/or diagnosis of SARS-CoV-2 by FDA under an Emergency Use Authorization (EUA). This EUA will remain in effect (meaning this test can be used) for the duration of the COVID-19 declaration under Section 564(b)(1) of the Act, 21 U.S.C. section 360bbb-3(b)(1), unless the authorization is terminated or revoked.  Performed at [facility], [place] [address], [place], [place] [address]     Procedures/Studies: DG Chest 1 View  Result Date: 09/25/2020 CLINICAL DATA:  Fall with shoulder pain EXAM: CHEST  1 VIEW COMPARISON:  September 21, 2020 FINDINGS: Two lead left chest pacemaker with leads overlying the right atrium and right ventricle. Heart size is upper limits of normal. Moderate size hiatal hernia. No focal consolidation. No significant pleural effusion or visible pneumothorax. Aortic atherosclerosis. Anterior dislocation of the right humeral head at the glenoid. IMPRESSION: 1. Anterior dislocation of the right humeral head at the glenoid. 2. No acute cardiopulmonary abnormality. Electronically Signed   By: Dahlia Bailiff MD   On: 09/25/2020 19:12   DG Shoulder Right  Result Date: 09/26/2020 CLINICAL DATA:  Post reduction shoulder. EXAM: DG C-ARM 1-60 MIN; RIGHT SHOULDER - 2+ VIEW FLUOROSCOPY TIME:  Fluoroscopy Time:  24 seconds. Number of Acquired Spot Images: 2 COMPARISON:  None. FINDINGS: Two C-arm fluoroscopic images were obtained intraoperatively and submitted for post operative interpretation. These images demonstrate reduction of the patient's previously seen shoulder dislocation. Although evaluation is limited on these 2 images, the shoulder appears to be located. Postoperative radiographs could further evaluate if clinically indicated.  Please see the performing provider's procedural report for further detail. IMPRESSION: Intraoperative fluoroscopy, as detailed above. Electronically Signed   By: Margaretha Sheffield MD   On: 09/26/2020 14:20   DG Shoulder Right  Result Date: 09/25/2020 CLINICAL DATA:  Right shoulder dislocation, attempted reduction EXAM: RIGHT SHOULDER - 2+ VIEW COMPARISON:  None. FINDINGS: Single-view, frontal radiograph of the right shoulder demonstrates persistent anteroinferior right glenohumeral dislocation. No definite superimposed fracture. Acromioclavicular joint space appears preserved. There is diffuse subcutaneous edema within the soft tissues of the right shoulder and visualized right upper extremity. Limited evaluation of the right hemithorax is unremarkable. IMPRESSION: Persistent anteroinferior right shoulder dislocation. Electronically Signed   By: Fidela Salisbury MD   On: 09/25/2020 23:37   DG Shoulder Right  Result Date: 09/25/2020 CLINICAL DATA:  Fall with pain. EXAM: RIGHT SHOULDER - 2+ VIEW COMPARISON:  September 18, 2020. FINDINGS: Anterior dislocation of the humeral head at the glenoid. Soft tissues are unremarkable. IMPRESSION: Anterior dislocation of the humeral head. Electronically Signed   By: Dahlia Bailiff MD   On: 09/25/2020 19:08   DG Forearm Right  Result Date: 09/25/2020 CLINICAL DATA:  Fall with shoulder pain EXAM: RIGHT FOREARM - 2 VIEW COMPARISON:  September 18, 2020. FINDINGS: There is no evidence of fracture or other focal bone lesions. Soft tissues are unremarkable. IMPRESSION: Negative. Electronically Signed   By: Dahlia Bailiff MD   On: 09/25/2020 19:04   CT Head Wo Contrast  Result Date: 09/25/2020 CLINICAL DATA:  Un witnessed fall on 04/21, head laceration EXAM: CT HEAD WITHOUT CONTRAST CT CERVICAL SPINE WITHOUT CONTRAST TECHNIQUE: Multidetector CT imaging of the head and cervical spine was performed following the standard protocol without intravenous contrast. Multiplanar CT image  reconstructions of the cervical spine were also generated.  COMPARISON:  09/21/2020 FINDINGS: CT HEAD FINDINGS Brain: No evidence of acute large vascular territory infarction, hemorrhage, hydrocephalus, extra-axial collection or mass lesion/mass effect. Stable global parenchymal volume loss and ischemic change. Vascular: No hyperdense vessel. Atherosclerotic calcifications of the internal carotid arteries and vertebral arteries at the skull base. Skull: Normal. Negative for fracture or focal lesion. Sinuses/Orbits: The paranasal sinuses and mastoid air cells are predominantly clear. Prior lens surgery. Other: None CT CERVICAL SPINE FINDINGS Alignment: Normal.  No evidence of traumatic listhesis. Skull base and vertebrae: No acute fracture. No primary bone lesion or focal pathologic process. Soft tissues and spinal canal: No prevertebral fluid or swelling. No visible canal hematoma. Disc levels: Stable multilevel degenerative change of the spine including disc space narrowing, uncovertebral and facet hypertrophy as well as osteophytosis most significant in the lower cervical spine at C5-C6 and C6-C7. Upper chest: Negative. Other: None IMPRESSION: Motion degraded examination, within this context: 1. No evidence of acute intracranial pathology. 2. Stable chronic atrophic and ischemic changes compared to prior examination. 3. No evidence of acute fracture or traumatic listhesis of the cervical spine. 4. Stable multilevel degenerative change of the cervical spine. Electronically Signed   By: Dahlia Bailiff MD   On: 09/25/2020 17:02   CT Chest W Contrast  Result Date: 09/25/2020 CLINICAL DATA:  Unwitnessed fall with bruising to the right side of the body and various stages of healing. EXAM: CT CHEST, ABDOMEN, AND PELVIS WITH CONTRAST TECHNIQUE: Multidetector CT imaging of the chest, abdomen and pelvis was performed following the standard protocol during bolus administration of intravenous contrast. CONTRAST:  163mL  OMNIPAQUE IOHEXOL 300 MG/ML  SOLN COMPARISON:  09/18/2020 FINDINGS: CT CHEST FINDINGS Cardiovascular: Normal heart size. No pericardial effusions. Calcification in the aorta, coronary arteries, and mitral valve annulus. Cardiac pacemaker. Normal caliber thoracic aorta with diffuse calcification. Mediastinum/Nodes: No significant lymphadenopathy. Moderate esophageal hiatal hernia. Esophagus is decompressed. Lungs/Pleura: Small bilateral pleural effusions with basilar atelectasis. Peripheral interstitial pattern to the lungs may represent early edema. No focal consolidation. No pneumothorax. Musculoskeletal: Complete anterior dislocation of the right humeral head with respect to the glenoid, new since prior study. Large right shoulder effusion with edema in the visualized right upper arm. Edema and infiltration in the subcutaneous fat of the right axilla and right chest wall. Acute appearing nondisplaced fractures of the right twelfth rib and left seventh rib. Multiple thoracic vertebral compression deformities at T1, T2, and T4 levels are unchanged since prior study. The sternum appears intact. CT ABDOMEN PELVIS FINDINGS Hepatobiliary: Cholelithiasis without evidence of cholecystitis. No bile duct dilatation. No focal liver lesions. Pancreas: Unremarkable. No pancreatic ductal dilatation or surrounding inflammatory changes. Spleen: No splenic injury or perisplenic hematoma. Adrenals/Urinary Tract: No adrenal hemorrhage or renal injury identified. Bladder is unremarkable. Stomach/Bowel: Stomach, small bowel, and colon are not abnormally distended. Stool fills the colon. Residual contrast material in the rectosigmoid region. Vascular/Lymphatic: Prominent aortic calcification. No aneurysm. No significant lymphadenopathy. Reproductive: Prostate is unremarkable. Other: No free air or free fluid in the abdomen. Abdominal wall musculature appears intact. Mild edema in the subcutaneous fat along the right flank region,  likely contusion. Musculoskeletal: Normal alignment of the lumbar spine. Diffuse degenerative change. No vertebral compression deformities. The sacrum, pelvis, and hips appear intact. IMPRESSION: 1. Complete anterior dislocation of the right humeral head with respect to the glenoid, new since prior study. Large right shoulder effusion with edema in the visualized right upper arm. Edema and infiltration in the subcutaneous fat of the right axilla  and right chest wall. 2. Acute appearing nondisplaced fractures of the right twelfth rib and left seventh rib. 3. Multiple thoracic vertebral compression deformities are unchanged since prior study. 4. Small bilateral pleural effusions with basilar atelectasis. Peripheral interstitial pattern to the lungs may represent early edema. 5. Moderate esophageal hiatal hernia. 6. Cholelithiasis without evidence of cholecystitis. 7. Mild edema in the subcutaneous fat along the right flank region, likely contusion. 8. Aortic atherosclerosis. Aortic Atherosclerosis (ICD10-I70.0). Electronically Signed   By: Lucienne Capers M.D.   On: 09/25/2020 20:47   CT Cervical Spine Wo Contrast  Result Date: 09/25/2020 CLINICAL DATA:  Un witnessed fall on 04/21, head laceration EXAM: CT HEAD WITHOUT CONTRAST CT CERVICAL SPINE WITHOUT CONTRAST TECHNIQUE: Multidetector CT imaging of the head and cervical spine was performed following the standard protocol without intravenous contrast. Multiplanar CT image reconstructions of the cervical spine were also generated. COMPARISON:  09/21/2020 FINDINGS: CT HEAD FINDINGS Brain: No evidence of acute large vascular territory infarction, hemorrhage, hydrocephalus, extra-axial collection or mass lesion/mass effect. Stable global parenchymal volume loss and ischemic change. Vascular: No hyperdense vessel. Atherosclerotic calcifications of the internal carotid arteries and vertebral arteries at the skull base. Skull: Normal. Negative for fracture or focal  lesion. Sinuses/Orbits: The paranasal sinuses and mastoid air cells are predominantly clear. Prior lens surgery. Other: None CT CERVICAL SPINE FINDINGS Alignment: Normal.  No evidence of traumatic listhesis. Skull base and vertebrae: No acute fracture. No primary bone lesion or focal pathologic process. Soft tissues and spinal canal: No prevertebral fluid or swelling. No visible canal hematoma. Disc levels: Stable multilevel degenerative change of the spine including disc space narrowing, uncovertebral and facet hypertrophy as well as osteophytosis most significant in the lower cervical spine at C5-C6 and C6-C7. Upper chest: Negative. Other: None IMPRESSION: Motion degraded examination, within this context: 1. No evidence of acute intracranial pathology. 2. Stable chronic atrophic and ischemic changes compared to prior examination. 3. No evidence of acute fracture or traumatic listhesis of the cervical spine. 4. Stable multilevel degenerative change of the cervical spine. Electronically Signed   By: Dahlia Bailiff MD   On: 09/25/2020 17:02   CT ABDOMEN PELVIS W CONTRAST  Result Date: 10/11/2020 CLINICAL DATA:  87 year old male with abdominal pain. Concern for GI bleed. EXAM: CT ABDOMEN AND PELVIS WITH CONTRAST TECHNIQUE: Multidetector CT imaging of the abdomen and pelvis was performed using the standard protocol following bolus administration of intravenous contrast. CONTRAST:  33mL OMNIPAQUE IOHEXOL 300 MG/ML  SOLN COMPARISON:  CT abdomen pelvis dated 09/25/2020. FINDINGS: Evaluation is limited due to streak artifact caused by patient's arms. Lower chest: The visualized lung bases are clear. Advanced 3 vessel coronary vascular calcification and calcification of the mitral annulus. Partially visualized pacemaker wires. No intra-abdominal free air or free fluid. Hepatobiliary: Irregular liver contour in keeping with cirrhosis. No intrahepatic biliary dilatation. Multiple stones in the gallbladder. No  pericholecystic fluid or evidence of acute cholecystitis by CT. Pancreas: Unremarkable. No pancreatic ductal dilatation or surrounding inflammatory changes. Spleen: Normal in size without focal abnormality. Adrenals/Urinary Tract: The adrenal glands unremarkable. There is no hydronephrosis on either side. There is symmetric enhancement and excretion of contrast by both kidneys. There is a 3 cm left renal interpolar exophytic cyst. The visualized ureters are unremarkable. There is mild trabeculated appearance of the bladder wall, likely related to chronic bladder outlet obstruction. Stomach/Bowel: There is a small hiatal hernia. There is moderate stool throughout the colon. Dense oral contrast with associated streak artifact noted in  the rectal vault. Evaluation for GI bleed is very limited, nondiagnostic, on the CT due to presence of oral contrast. There is sigmoid diverticulosis without active inflammatory changes. There is no bowel obstruction. The appendix is not visualized with certainty. No inflammatory changes identified in the right lower quadrant. Vascular/Lymphatic: Advanced aortoiliac atherosclerotic disease. There is a 2.3 cm infrarenal aortic ectasia. The IVC is unremarkable. No portal venous gas. There is no adenopathy. Reproductive: The prostate gland is grossly unremarkable. Other: None Musculoskeletal: Osteopenia with degenerative changes of the spine. No acute osseous pathology. IMPRESSION: 1. Sigmoid diverticulosis. No bowel obstruction or active inflammation. 2. Cirrhosis. 3. Cholelithiasis. 4. Aortic Atherosclerosis (ICD10-I70.0). Electronically Signed   By: Anner Crete M.D.   On: 10/11/2020 20:35   CT ABDOMEN PELVIS W CONTRAST  Result Date: 09/25/2020 CLINICAL DATA:  Unwitnessed fall with bruising to the right side of the body and various stages of healing. EXAM: CT CHEST, ABDOMEN, AND PELVIS WITH CONTRAST TECHNIQUE: Multidetector CT imaging of the chest, abdomen and pelvis was  performed following the standard protocol during bolus administration of intravenous contrast. CONTRAST:  142mL OMNIPAQUE IOHEXOL 300 MG/ML  SOLN COMPARISON:  09/18/2020 FINDINGS: CT CHEST FINDINGS Cardiovascular: Normal heart size. No pericardial effusions. Calcification in the aorta, coronary arteries, and mitral valve annulus. Cardiac pacemaker. Normal caliber thoracic aorta with diffuse calcification. Mediastinum/Nodes: No significant lymphadenopathy. Moderate esophageal hiatal hernia. Esophagus is decompressed. Lungs/Pleura: Small bilateral pleural effusions with basilar atelectasis. Peripheral interstitial pattern to the lungs may represent early edema. No focal consolidation. No pneumothorax. Musculoskeletal: Complete anterior dislocation of the right humeral head with respect to the glenoid, new since prior study. Large right shoulder effusion with edema in the visualized right upper arm. Edema and infiltration in the subcutaneous fat of the right axilla and right chest wall. Acute appearing nondisplaced fractures of the right twelfth rib and left seventh rib. Multiple thoracic vertebral compression deformities at T1, T2, and T4 levels are unchanged since prior study. The sternum appears intact. CT ABDOMEN PELVIS FINDINGS Hepatobiliary: Cholelithiasis without evidence of cholecystitis. No bile duct dilatation. No focal liver lesions. Pancreas: Unremarkable. No pancreatic ductal dilatation or surrounding inflammatory changes. Spleen: No splenic injury or perisplenic hematoma. Adrenals/Urinary Tract: No adrenal hemorrhage or renal injury identified. Bladder is unremarkable. Stomach/Bowel: Stomach, small bowel, and colon are not abnormally distended. Stool fills the colon. Residual contrast material in the rectosigmoid region. Vascular/Lymphatic: Prominent aortic calcification. No aneurysm. No significant lymphadenopathy. Reproductive: Prostate is unremarkable. Other: No free air or free fluid in the abdomen.  Abdominal wall musculature appears intact. Mild edema in the subcutaneous fat along the right flank region, likely contusion. Musculoskeletal: Normal alignment of the lumbar spine. Diffuse degenerative change. No vertebral compression deformities. The sacrum, pelvis, and hips appear intact. IMPRESSION: 1. Complete anterior dislocation of the right humeral head with respect to the glenoid, new since prior study. Large right shoulder effusion with edema in the visualized right upper arm. Edema and infiltration in the subcutaneous fat of the right axilla and right chest wall. 2. Acute appearing nondisplaced fractures of the right twelfth rib and left seventh rib. 3. Multiple thoracic vertebral compression deformities are unchanged since prior study. 4. Small bilateral pleural effusions with basilar atelectasis. Peripheral interstitial pattern to the lungs may represent early edema. 5. Moderate esophageal hiatal hernia. 6. Cholelithiasis without evidence of cholecystitis. 7. Mild edema in the subcutaneous fat along the right flank region, likely contusion. 8. Aortic atherosclerosis. Aortic Atherosclerosis (ICD10-I70.0). Electronically Signed   By: Gwyndolyn Saxon  Gerilyn Nestle M.D.   On: 09/25/2020 20:47   DG Shoulder Right Port  Result Date: 09/27/2020 CLINICAL DATA:  Anterior shoulder dislocation post reduction EXAM: PORTABLE RIGHT SHOULDER COMPARISON:  09/25/2020 FINDINGS: The glenohumeral joint is intact. Shoulder is located. No fracture identified. IMPRESSION: Intact glenohumeral joint post reduction. Electronically Signed   By: Suzy Bouchard M.D.   On: 09/27/2020 11:23   DG Shoulder Right Port  Result Date: 09/26/2020 CLINICAL DATA:  The patient suffered an anterior right shoulder dislocation in a fall today. Status post reduction. EXAM: PORTABLE RIGHT SHOULDER COMPARISON:  Plain films right shoulder earlier today. FINDINGS: The shoulder is now located.  No new abnormality. IMPRESSION: Successful reduction of  dislocation. Electronically Signed   By: Inge Rise M.D.   On: 09/26/2020 14:02   DG Humerus Right  Result Date: 09/25/2020 CLINICAL DATA:  Fall with pain EXAM: RIGHT HUMERUS - 2+ VIEW COMPARISON:  September 18, 2020 FINDINGS: There is no evidence of humerus fracture. Anterior dislocation of the humeral head at the glenoid soft tissues are unremarkable. IMPRESSION: Anterior shoulder dislocation. No fracture of the humerus visualized. Electronically Signed   By: Dahlia Bailiff MD   On: 09/25/2020 19:09   DG Hand Complete Right  Result Date: 09/25/2020 CLINICAL DATA:  Fall with pain EXAM: RIGHT HAND - COMPLETE 3+ VIEW COMPARISON:  None. FINDINGS: There is no evidence of fracture or dislocation. Degenerative change of the interphalangeal joints as well as the first MTP and CPC joints. Soft tissues are unremarkable. IMPRESSION: No fracture or dislocation of the right hand. Degenerative changes as above. Electronically Signed   By: Dahlia Bailiff MD   On: 09/25/2020 19:14   DG C-Arm 1-60 Min  Result Date: 09/26/2020 CLINICAL DATA:  Post reduction shoulder. EXAM: DG C-ARM 1-60 MIN; RIGHT SHOULDER - 2+ VIEW FLUOROSCOPY TIME:  Fluoroscopy Time:  24 seconds. Number of Acquired Spot Images: 2 COMPARISON:  None. FINDINGS: Two C-arm fluoroscopic images were obtained intraoperatively and submitted for post operative interpretation. These images demonstrate reduction of the patient's previously seen shoulder dislocation. Although evaluation is limited on these 2 images, the shoulder appears to be located. Postoperative radiographs could further evaluate if clinically indicated. Please see the performing provider's procedural report for further detail. IMPRESSION: Intraoperative fluoroscopy, as detailed above. Electronically Signed   By: Margaretha Sheffield MD   On: 09/26/2020 14:20   DG Hip Unilat W or Wo Pelvis 2-3 Views Left  Result Date: 09/25/2020 CLINICAL DATA:  Fall with pain EXAM: DG HIP (WITH OR WITHOUT  PELVIS) 2-3V LEFT COMPARISON:  September 18, 2020 FINDINGS: There is no evidence of hip fracture or dislocation. There is no evidence of arthropathy or other focal bone abnormality. Radiopaque contrast in the colon. Vascular calcifications. Pelvic phleboliths. IMPRESSION: Negative. Electronically Signed   By: Dahlia Bailiff MD   On: 09/25/2020 19:10     Time coordinating discharge: Over 30 minutes    Dwyane Dee, MD  [facility] 10/14/2020, 8:51 AM

## 2020-10-14 NOTE — Progress Notes (Addendum)
Patient stated, "I need to pee." Son at bedside and assisted with holding urinal for pt. Unable to void. Bladder palpable. Bladder scan= 348 cc. MD messaged. Order for I/O cath.   Receiving RN at [facility] contacted and made aware of I/O cath for urine retention.

## 2020-10-14 NOTE — Progress Notes (Signed)
Report called to receiving RN at [facility].

## 2020-10-14 NOTE — TOC Transition Note (Signed)
Transition of Care [facility]) - CM/SW Discharge Note   Patient Details  Name: Cordie Buening MRN: [id number] Date of Birth: 1924/09/06  Transition of Care [name]) CM/SW Contact:  Trish Mage, LCSW Phone Number: 10/14/2020, 9:14 AM   Clinical Narrative:  Patient who is stable for d/c will return to [facility] SNF today. D/C summary FAXed to [phone] as requested.  PTAR arranged. Nursing, please call report to [phone], room 145, bed 1. TOC sign off.    Final next level of care: Skilled Nursing Facility Barriers to Discharge: Barriers Resolved   Patient Goals and CMS Choice Patient states their goals for this hospitalization and ongoing recovery are:: unable to state CMS Medicare.gov Compare Post Acute Care list provided to:: Patient    Discharge Placement                       Discharge Plan and Services   Discharge Planning Services: CM Consult Post Acute Care Choice: [place]                               Social Determinants of Health (SDOH) Interventions     Readmission Risk Interventions Readmission Risk Prevention Plan 09/27/2020  Transportation Screening Complete  Home Care Screening Complete  Medication Review (RN CM) Complete  Some recent data might be hidden

## 2020-10-16 ENCOUNTER — Encounter (HOSPITAL_COMMUNITY): Payer: Self-pay | Admitting: Gastroenterology

## 2020-10-20 ENCOUNTER — Encounter: Payer: Self-pay | Admitting: Orthopedic Surgery

## 2020-10-20 ENCOUNTER — Ambulatory Visit: Payer: Medicare Other

## 2020-10-20 ENCOUNTER — Ambulatory Visit (INDEPENDENT_AMBULATORY_CARE_PROVIDER_SITE_OTHER): Payer: Medicare Other | Admitting: Orthopedic Surgery

## 2020-10-20 ENCOUNTER — Other Ambulatory Visit: Payer: Self-pay

## 2020-10-20 DIAGNOSIS — S43014D Anterior dislocation of right humerus, subsequent encounter: Secondary | ICD-10-CM | POA: Diagnosis not present

## 2020-10-20 NOTE — Progress Notes (Signed)
Orthopaedic Postop Note  Assessment: Tracy Castillo is a 87 y.o. male s/p closed right shoulder reduction requiring general anesthesia in the OR; radial nerve palsy, but due to difficulties with physical exam, he may have additional nerve injuries at this time  DOS: 09/26/2020  Plan: With some difficulty, we were able to obtain appropriate x-rays that demonstrates maintained reduction of the right glenohumeral joint, although there is some anterior subluxation.  On physical exam, the subluxation is visible and palpable.  He should remain in the sling, but it is okay to remove the sling to start working on some passive range of motion.  At this time, we should limit his motion to nothing greater than 90 degrees of forward flexion or abduction.  He should not be abducted and externally rotated at the same time.  Regarding his radial nerve palsy, have recommended a resting wrist splint be placed in order to maintain his wrist and fingers in the neutral position.  We will see him back in approximately 6 weeks for repeat evaluation.  Follow-up: Return in about 6 weeks (around 12/01/2020). XR at next visit: Right shoulder, including axillary  Subjective:  Chief Complaint  Patient presents with  . Shoulder Injury    Shoulder dislocation / reduction 09/30/20    History of Present Illness: Tracy Castillo is a 87 y.o. male who presents following the above stated procedure.  Since discharge, he returned to a nursing facility, but also had other medical conditions.  He has recently been hospitalized.  He is wearing the sling in clinic today.  Family is in clinic today and states that it has been difficult to keep his sling on.  No additional injuries or events since his shoulder has been reduced.  He continues to have some dressings on his right arm, but the swelling and general condition of his skin has significantly improved.  He has limited movement in his right hand.  He remains comfortable when his  shoulder is not moved.  Review of Systems: No fevers or chills No Chest Pain No shortness of breath   Objective: There were no vitals taken for this visit.  Physical Exam:  Elderly male.  Seated in a wheelchair.  He does respond to questioning, but he is not always understood.  Evaluation of the right shoulder demonstrates a mild deformity.  The humeral head is palpable, and does appear to be resting anteriorly within the glenoid.  He tolerates limited passive forward flexion and abduction due to pain.  Swelling has significantly improved in his right arm.  He does continue to have some skin tears and weeping around his wrist.  He states he has slightly decreased sensation in the axillary patch.  He is unable to extend his wrist or fingers.  Flexion of his fingers is intact.  Fingers are warm and well-perfused.  IMAGING: I personally ordered and reviewed the following images:   AP, scapular Y, Grashey and axillary views were obtained in clinic today of the right shoulder.  The glenohumeral joint remains reduced, with slight anterior subluxation as visualized on the axillary view.  No acute injuries.  There is no proximal humeral migration.  Impression: Slight anterior subluxation of right glenohumeral joint.  Acceptable alignment overall   Mordecai Rasmussen, MD 10/20/2020 9:25 PM

## 2020-10-20 NOTE — Patient Instructions (Signed)
Right shoulder is reduced, slight anterior subluxation  Ok to start PROM right shoulder.  No forward flexion or abuction above the level of the shoulder.  No Abduction and ER together.   He has a radial nerve palsy, recommend a splint on his hand to maintain neutral resting position while the nerve recovers

## 2020-12-01 ENCOUNTER — Encounter: Payer: Medicare Other | Admitting: Orthopedic Surgery

## 2020-12-01 DEATH — deceased

## 2021-06-07 IMAGING — CT CT ABD-PELV W/ CM
2 of 5 series · 15 of 46 positions shown, 17 images · IV contrast (omnipaque)
Comparison: CT abdomen pelvis dated 09/25/2020.

CLINICAL DATA: [AGE] male with abdominal pain. Concern for GI
bleed.

EXAM:
CT ABDOMEN AND PELVIS WITH CONTRAST
TECHNIQUE: Multidetector CT imaging of the abdomen and pelvis was performed
using the standard protocol following bolus administration of
intravenous contrast.
CONTRAST:  75mL OMNIPAQUE IOHEXOL 300 MG/ML  SOLN

[Series 2: axial st · axial · 0.71mm/px · z∈[+937,+1352]mm · 12 of 97 slices shown, 14 images]
[im 7/97  soft-tissue]
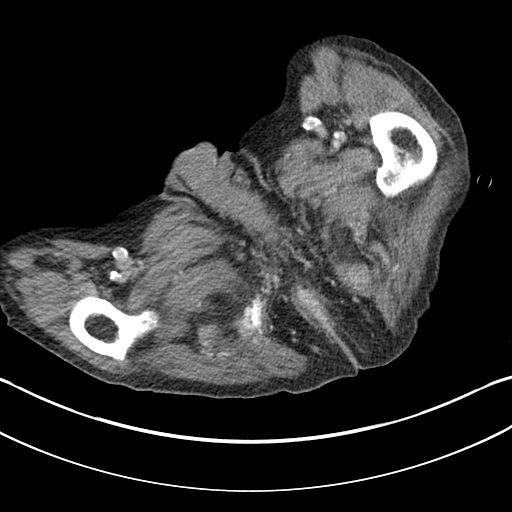
[im 7/97  bone]
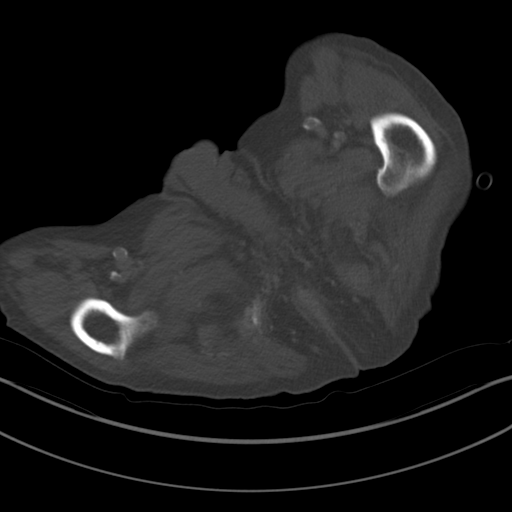
[im 13/97  soft-tissue]
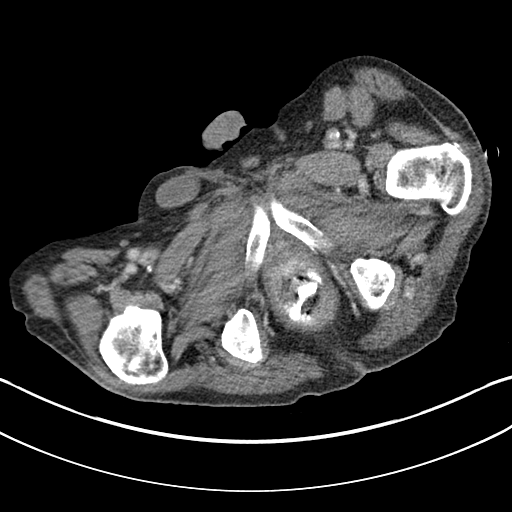
[im 20/97  soft-tissue]
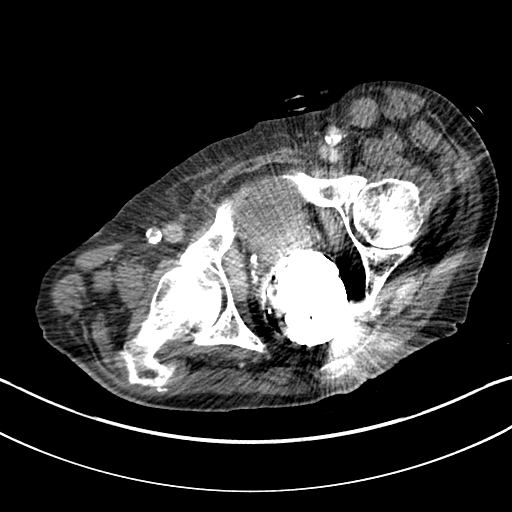
[im 33/97  soft-tissue]
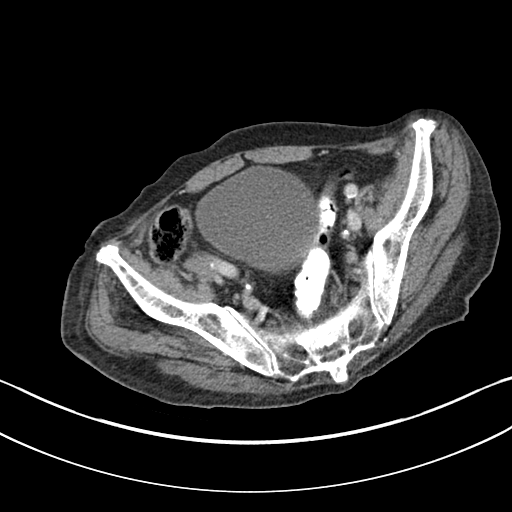
[im 39/97  soft-tissue]
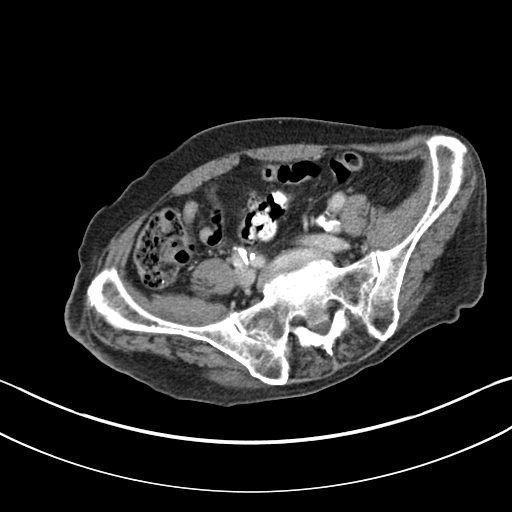
[im 45/97  soft-tissue]
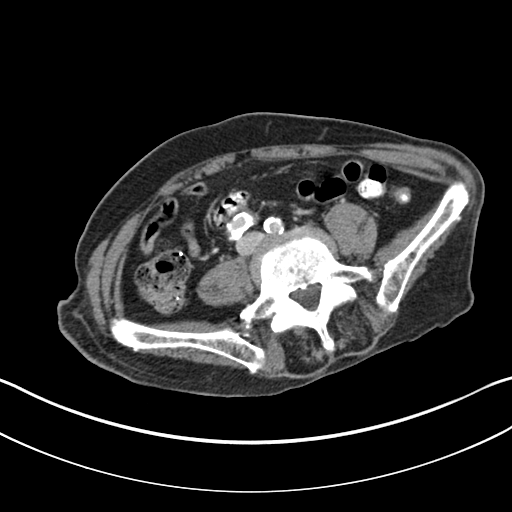
[im 52/97  soft-tissue]
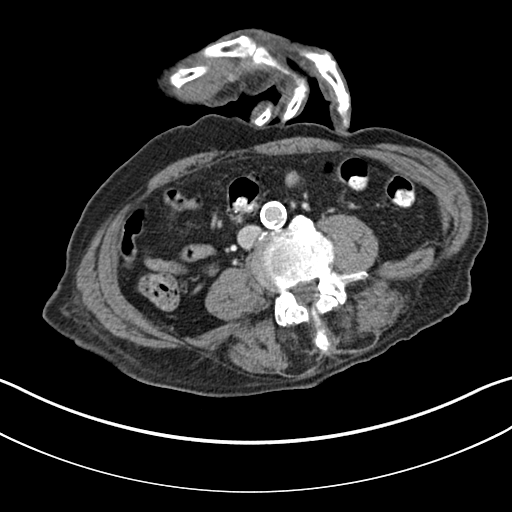
[im 58/97  soft-tissue]
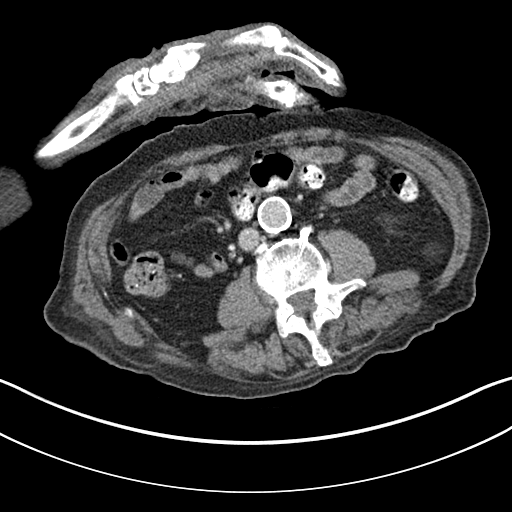
[im 65/97  soft-tissue]
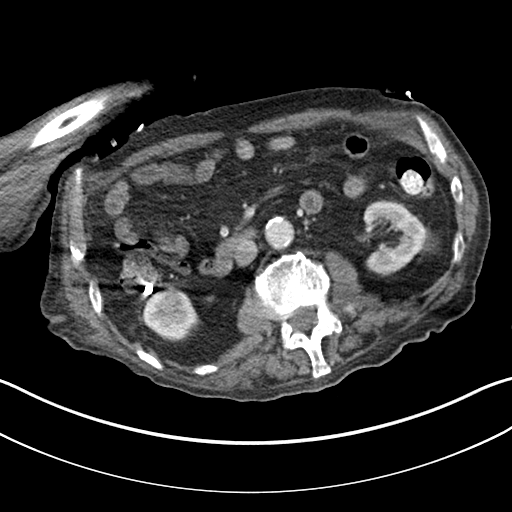
[im 65/97  bone]
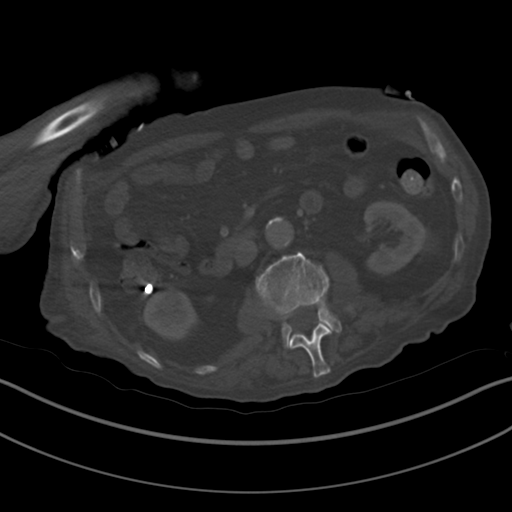
[im 77/97  soft-tissue]
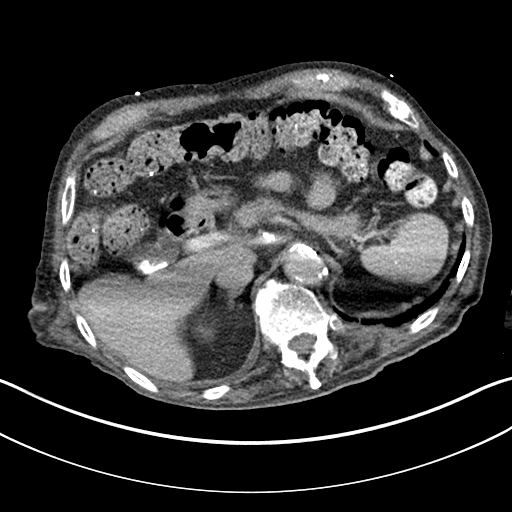
[im 84/97  soft-tissue]
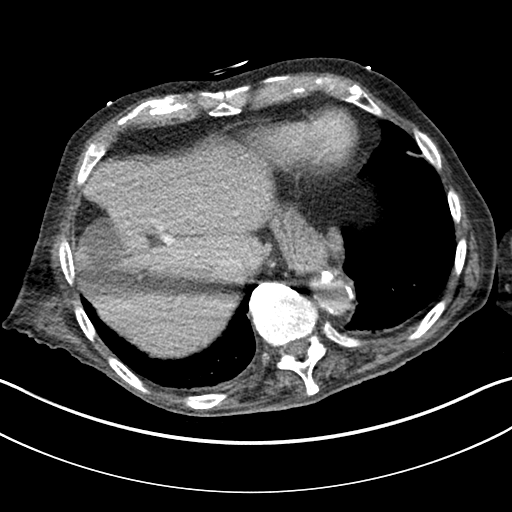
[im 90/97  soft-tissue]
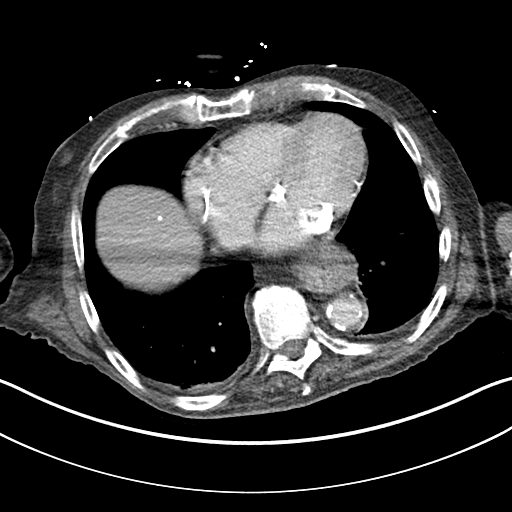

[Series 4: coronal st · coronal · 0.65mm/px · 3 of 128 slices shown]
[im 43/128  soft-tissue]
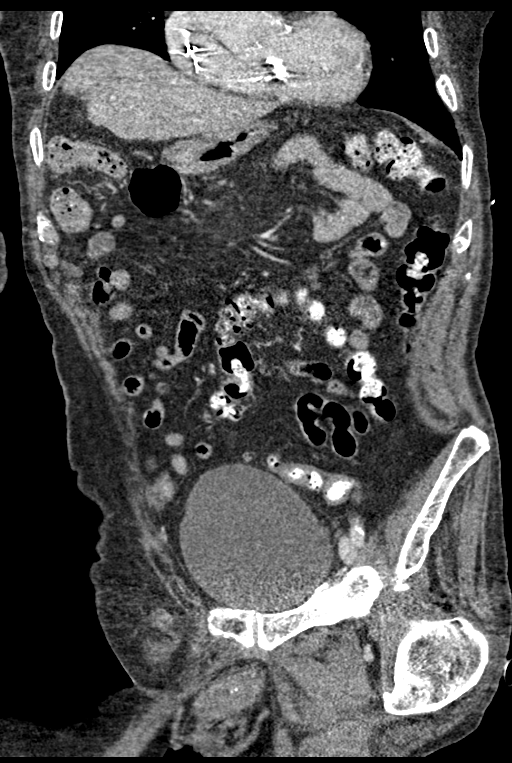
[im 57/128  soft-tissue]
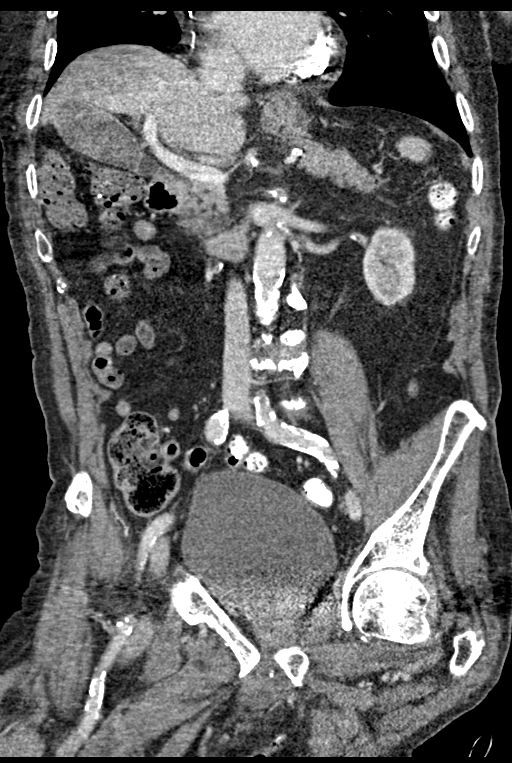
[im 71/128  soft-tissue]
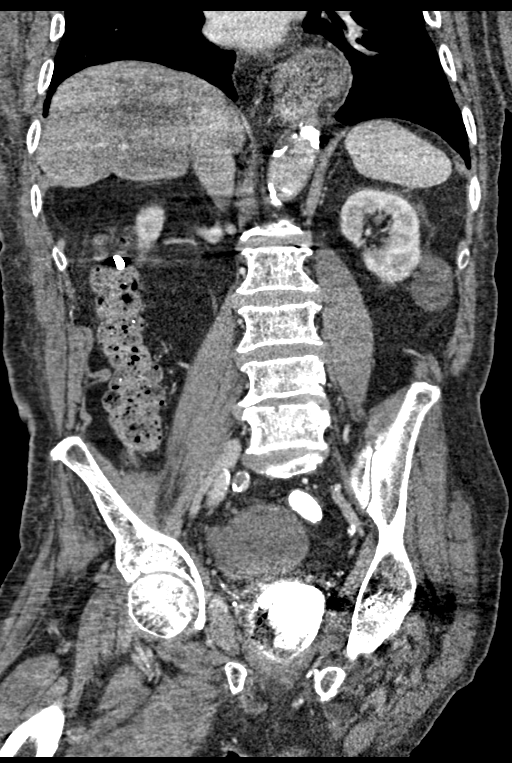

[15 of 46 positions shown; findings below may reference images not displayed]

FINDINGS: Evaluation is limited due to streak artifact caused by patient's
arms.

Lower chest: The visualized lung bases are clear. Advanced 3 vessel
coronary vascular calcification and calcification of the mitral
annulus. Partially visualized pacemaker wires.

No intra-abdominal free air or free fluid.

Hepatobiliary: Irregular liver contour in keeping with cirrhosis. No
intrahepatic biliary dilatation. Multiple stones in the gallbladder.
No pericholecystic fluid or evidence of acute cholecystitis by CT.

Pancreas: Unremarkable. No pancreatic ductal dilatation or
surrounding inflammatory changes.

Spleen: Normal in size without focal abnormality.

Adrenals/Urinary Tract: The adrenal glands unremarkable. There is no
hydronephrosis on either side. There is symmetric enhancement and
excretion of contrast by both kidneys. There is a 3 cm left renal
interpolar exophytic cyst. The visualized ureters are unremarkable.
There is mild trabeculated appearance of the bladder wall, likely
related to chronic bladder outlet obstruction.

Stomach/Bowel: There is a small hiatal hernia. There is moderate
stool throughout the colon. Dense oral contrast with associated
streak artifact noted in the rectal vault. Evaluation for GI bleed
is very limited, nondiagnostic, on the CT due to presence of oral
contrast. There is sigmoid diverticulosis without active
inflammatory changes. There is no bowel obstruction. The appendix is
not visualized with certainty. No inflammatory changes identified in
the right lower quadrant.

Vascular/Lymphatic: Advanced aortoiliac atherosclerotic disease.
There is a 2.3 cm infrarenal aortic ectasia. The IVC is
unremarkable. No portal venous gas. There is no adenopathy.

Reproductive: The prostate gland is grossly unremarkable.

Other: None

Musculoskeletal: Osteopenia with degenerative changes of the spine.
No acute osseous pathology.
IMPRESSION: 1. Sigmoid diverticulosis. No bowel obstruction or active
inflammation.
2. Cirrhosis.
3. Cholelithiasis.
4. Aortic Atherosclerosis ([id number]).
